# Patient Record
Sex: Male | Born: 1945 | Race: Black or African American | Hispanic: No | Smoking: Current every day smoker
Health system: Southern US, Community
[De-identification: ages and names within clinical notes are randomized; demographics above are authoritative.]

## PROBLEM LIST (undated history)

## (undated) DIAGNOSIS — G8191 Hemiplegia, unspecified affecting right dominant side: Secondary | ICD-10-CM

## (undated) DIAGNOSIS — I10 Essential (primary) hypertension: Secondary | ICD-10-CM

## (undated) DIAGNOSIS — I619 Nontraumatic intracerebral hemorrhage, unspecified: Secondary | ICD-10-CM

## (undated) DIAGNOSIS — I639 Cerebral infarction, unspecified: Secondary | ICD-10-CM

## (undated) HISTORY — PX: NO PAST SURGERIES: SHX2092

## (undated) HISTORY — DX: Hemiplegia, unspecified affecting right dominant side: G81.91

## (undated) HISTORY — DX: Essential (primary) hypertension: I10

## (undated) HISTORY — DX: Nontraumatic intracerebral hemorrhage, unspecified: I61.9

---

## 2005-05-09 ENCOUNTER — Emergency Department: Payer: Self-pay | Admitting: General Practice

## 2005-05-12 ENCOUNTER — Emergency Department: Payer: Self-pay | Admitting: Emergency Medicine

## 2011-01-25 ENCOUNTER — Emergency Department: Payer: Self-pay | Admitting: Unknown Physician Specialty

## 2014-07-04 ENCOUNTER — Encounter (HOSPITAL_COMMUNITY): Payer: Self-pay

## 2014-07-04 ENCOUNTER — Emergency Department (HOSPITAL_COMMUNITY): Payer: Medicare Other

## 2014-07-04 ENCOUNTER — Inpatient Hospital Stay (HOSPITAL_COMMUNITY)
Admission: EM | Admit: 2014-07-04 | Discharge: 2014-07-07 | DRG: 065 | Disposition: A | Discharge status: Rehabilitation Facility | Payer: Medicare Other | Source: Other Acute Inpatient Hospital | Attending: Neurology | Admitting: Neurology

## 2014-07-04 ENCOUNTER — Other Ambulatory Visit (HOSPITAL_COMMUNITY): Payer: Self-pay

## 2014-07-04 DIAGNOSIS — G9341 Metabolic encephalopathy: Secondary | ICD-10-CM | POA: Diagnosis present

## 2014-07-04 DIAGNOSIS — I6931 Cognitive deficits following cerebral infarction: Secondary | ICD-10-CM | POA: Diagnosis not present

## 2014-07-04 DIAGNOSIS — R8290 Unspecified abnormal findings in urine: Secondary | ICD-10-CM | POA: Diagnosis present

## 2014-07-04 DIAGNOSIS — K5900 Constipation, unspecified: Secondary | ICD-10-CM | POA: Diagnosis present

## 2014-07-04 DIAGNOSIS — R4701 Aphasia: Secondary | ICD-10-CM | POA: Diagnosis present

## 2014-07-04 DIAGNOSIS — I629 Nontraumatic intracranial hemorrhage, unspecified: Secondary | ICD-10-CM

## 2014-07-04 DIAGNOSIS — I615 Nontraumatic intracerebral hemorrhage, intraventricular: Secondary | ICD-10-CM | POA: Diagnosis present

## 2014-07-04 DIAGNOSIS — E860 Dehydration: Secondary | ICD-10-CM | POA: Diagnosis present

## 2014-07-04 DIAGNOSIS — J9601 Acute respiratory failure with hypoxia: Secondary | ICD-10-CM | POA: Diagnosis present

## 2014-07-04 DIAGNOSIS — G8191 Hemiplegia, unspecified affecting right dominant side: Secondary | ICD-10-CM | POA: Diagnosis present

## 2014-07-04 DIAGNOSIS — G919 Hydrocephalus, unspecified: Secondary | ICD-10-CM | POA: Diagnosis present

## 2014-07-04 DIAGNOSIS — I69151 Hemiplegia and hemiparesis following nontraumatic intracerebral hemorrhage affecting right dominant side: Secondary | ICD-10-CM | POA: Diagnosis not present

## 2014-07-04 DIAGNOSIS — I10 Essential (primary) hypertension: Secondary | ICD-10-CM | POA: Diagnosis present

## 2014-07-04 DIAGNOSIS — I619 Nontraumatic intracerebral hemorrhage, unspecified: Secondary | ICD-10-CM | POA: Diagnosis present

## 2014-07-04 DIAGNOSIS — R4189 Other symptoms and signs involving cognitive functions and awareness: Secondary | ICD-10-CM | POA: Diagnosis present

## 2014-07-04 DIAGNOSIS — R4182 Altered mental status, unspecified: Secondary | ICD-10-CM

## 2014-07-04 DIAGNOSIS — H538 Other visual disturbances: Secondary | ICD-10-CM | POA: Diagnosis present

## 2014-07-04 DIAGNOSIS — F17210 Nicotine dependence, cigarettes, uncomplicated: Secondary | ICD-10-CM | POA: Diagnosis present

## 2014-07-04 DIAGNOSIS — R0902 Hypoxemia: Secondary | ICD-10-CM

## 2014-07-04 DIAGNOSIS — G8100 Flaccid hemiplegia affecting unspecified side: Secondary | ICD-10-CM | POA: Diagnosis not present

## 2014-07-04 DIAGNOSIS — I77810 Thoracic aortic ectasia: Secondary | ICD-10-CM | POA: Diagnosis present

## 2014-07-04 DIAGNOSIS — E873 Alkalosis: Secondary | ICD-10-CM | POA: Diagnosis present

## 2014-07-04 DIAGNOSIS — I69319 Unspecified symptoms and signs involving cognitive functions following cerebral infarction: Secondary | ICD-10-CM

## 2014-07-04 DIAGNOSIS — E785 Hyperlipidemia, unspecified: Secondary | ICD-10-CM | POA: Diagnosis present

## 2014-07-04 DIAGNOSIS — E119 Type 2 diabetes mellitus without complications: Secondary | ICD-10-CM | POA: Diagnosis present

## 2014-07-04 DIAGNOSIS — R29810 Facial weakness: Secondary | ICD-10-CM | POA: Diagnosis present

## 2014-07-04 DIAGNOSIS — I610 Nontraumatic intracerebral hemorrhage in hemisphere, subcortical: Secondary | ICD-10-CM | POA: Diagnosis present

## 2014-07-04 DIAGNOSIS — R471 Dysarthria and anarthria: Secondary | ICD-10-CM | POA: Diagnosis present

## 2014-07-04 DIAGNOSIS — R1310 Dysphagia, unspecified: Secondary | ICD-10-CM | POA: Diagnosis present

## 2014-07-04 DIAGNOSIS — I69898 Other sequelae of other cerebrovascular disease: Secondary | ICD-10-CM | POA: Diagnosis not present

## 2014-07-04 DIAGNOSIS — I6981 Cognitive deficits following other cerebrovascular disease: Secondary | ICD-10-CM | POA: Diagnosis not present

## 2014-07-04 DIAGNOSIS — E876 Hypokalemia: Secondary | ICD-10-CM | POA: Diagnosis present

## 2014-07-04 DIAGNOSIS — I639 Cerebral infarction, unspecified: Secondary | ICD-10-CM

## 2014-07-04 DIAGNOSIS — I6789 Other cerebrovascular disease: Secondary | ICD-10-CM | POA: Diagnosis not present

## 2014-07-04 LAB — PROTIME-INR
INR: 1.24 (ref 0.00–1.49)
PROTHROMBIN TIME: 15.7 s — AB (ref 11.6–15.2)

## 2014-07-04 LAB — URINALYSIS, ROUTINE W REFLEX MICROSCOPIC
Glucose, UA: 100 mg/dL — AB
KETONES UR: 15 mg/dL — AB
NITRITE: NEGATIVE
Protein, ur: 100 mg/dL — AB
Specific Gravity, Urine: 1.028 (ref 1.005–1.030)
UROBILINOGEN UA: 1 mg/dL (ref 0.0–1.0)
pH: 5.5 (ref 5.0–8.0)

## 2014-07-04 LAB — COMPREHENSIVE METABOLIC PANEL
ALT: 15 U/L — AB (ref 17–63)
AST: 45 U/L — AB (ref 15–41)
Albumin: 4.2 g/dL (ref 3.5–5.0)
Alkaline Phosphatase: 60 U/L (ref 38–126)
Anion gap: 15 (ref 5–15)
BUN: 13 mg/dL (ref 6–20)
CALCIUM: 9.9 mg/dL (ref 8.9–10.3)
CHLORIDE: 100 mmol/L — AB (ref 101–111)
CO2: 22 mmol/L (ref 22–32)
Creatinine, Ser: 1.39 mg/dL — ABNORMAL HIGH (ref 0.61–1.24)
GFR calc non Af Amer: 50 mL/min — ABNORMAL LOW (ref >60)
GFR, EST AFRICAN AMERICAN: 58 mL/min — AB (ref >60)
Glucose, Bld: 158 mg/dL — ABNORMAL HIGH (ref 65–99)
POTASSIUM: 3.4 mmol/L — AB (ref 3.5–5.1)
Sodium: 137 mmol/L (ref 135–145)
Total Bilirubin: 0.9 mg/dL (ref 0.3–1.2)
Total Protein: 8 g/dL (ref 6.5–8.1)

## 2014-07-04 LAB — CBC
HCT: 40 % (ref 39.0–52.0)
Hemoglobin: 13.6 g/dL (ref 13.0–17.0)
MCH: 29.7 pg (ref 26.0–34.0)
MCHC: 34 g/dL (ref 30.0–36.0)
MCV: 87.3 fL (ref 78.0–100.0)
Platelets: 156 10*3/uL (ref 150–400)
RBC: 4.58 MIL/uL (ref 4.22–5.81)
RDW: 14 % (ref 11.5–15.5)
WBC: 11 10*3/uL — AB (ref 4.0–10.5)

## 2014-07-04 LAB — RAPID URINE DRUG SCREEN, HOSP PERFORMED
Amphetamines: NOT DETECTED
BARBITURATES: NOT DETECTED
Benzodiazepines: NOT DETECTED
Cocaine: NOT DETECTED
Opiates: NOT DETECTED
TETRAHYDROCANNABINOL: NOT DETECTED

## 2014-07-04 LAB — URINE MICROSCOPIC-ADD ON

## 2014-07-04 LAB — GLUCOSE, CAPILLARY
Glucose-Capillary: 134 mg/dL — ABNORMAL HIGH (ref 65–99)
Glucose-Capillary: 129 mg/dL — ABNORMAL HIGH (ref 65–99)

## 2014-07-04 LAB — DIFFERENTIAL
Basophils Absolute: 0 10*3/uL (ref 0.0–0.1)
Basophils Relative: 0 % (ref 0–1)
EOS ABS: 0 10*3/uL (ref 0.0–0.7)
EOS PCT: 0 % (ref 0–5)
LYMPHS PCT: 9 % — AB (ref 12–46)
Lymphs Abs: 1 10*3/uL (ref 0.7–4.0)
MONO ABS: 0.7 10*3/uL (ref 0.1–1.0)
MONOS PCT: 6 % (ref 3–12)
NEUTROS ABS: 9.3 10*3/uL — AB (ref 1.7–7.7)
Neutrophils Relative %: 85 % — ABNORMAL HIGH (ref 43–77)

## 2014-07-04 LAB — ETHANOL: Alcohol, Ethyl (B): <5 mg/dL (ref <5)

## 2014-07-04 LAB — I-STAT CHEM 8, ED
BUN: 17 mg/dL (ref 6–20)
CHLORIDE: 100 mmol/L — AB (ref 101–111)
Calcium, Ion: 1.14 mmol/L (ref 1.13–1.30)
Creatinine, Ser: 1.3 mg/dL — ABNORMAL HIGH (ref 0.61–1.24)
Glucose, Bld: 161 mg/dL — ABNORMAL HIGH (ref 65–99)
HEMATOCRIT: 45 % (ref 39.0–52.0)
Hemoglobin: 15.3 g/dL (ref 13.0–17.0)
POTASSIUM: 3.4 mmol/L — AB (ref 3.5–5.1)
Sodium: 137 mmol/L (ref 135–145)
TCO2: 20 mmol/L (ref 0–100)

## 2014-07-04 LAB — I-STAT TROPONIN, ED: Troponin i, poc: 0.05 ng/mL (ref 0.00–0.08)

## 2014-07-04 LAB — APTT: aPTT: 26 seconds (ref 24–37)

## 2014-07-04 LAB — MRSA PCR SCREENING: MRSA by PCR: NEGATIVE

## 2014-07-04 MED ORDER — ACETAMINOPHEN 650 MG RE SUPP: 650.0000 mg | RECTAL | Status: DC | PRN

## 2014-07-04 MED ORDER — STROKE: EARLY STAGES OF RECOVERY BOOK
Freq: Once | Status: AC
  Administered 2014-07-04: 20:00:00
  Filled 2014-07-04: qty 1

## 2014-07-04 MED ORDER — PANTOPRAZOLE SODIUM 40 MG IV SOLR
40.0000 mg | Freq: Every day | INTRAVENOUS | Status: DC
  Administered 2014-07-04: 40 mg via INTRAVENOUS
  Filled 2014-07-04 (×2): qty 40

## 2014-07-04 MED ORDER — ACETAMINOPHEN 325 MG PO TABS: 650.0000 mg | ORAL_TABLET | ORAL | Status: DC | PRN

## 2014-07-04 MED ORDER — LABETALOL HCL 5 MG/ML IV SOLN
10.0000 mg | INTRAVENOUS | Status: DC | PRN
  Administered 2014-07-04: 10 mg via INTRAVENOUS
  Administered 2014-07-05: 20 mg via INTRAVENOUS
  Filled 2014-07-04 (×2): qty 4

## 2014-07-04 MED ORDER — SENNOSIDES-DOCUSATE SODIUM 8.6-50 MG PO TABS
1.0000 | ORAL_TABLET | Freq: Two times a day (BID) | ORAL | Status: DC
  Administered 2014-07-05 – 2014-07-06 (×3): 1 via ORAL
  Filled 2014-07-04 (×7): qty 1

## 2014-07-04 NOTE — Consult Note (Signed)
Stroke Consult    Chief Complaint: right sided weakness and confusion  HPI: Niklaus Mamaril is an 69 y.o. male with history of hypertension presenting after being found down with confusion and right sided weakness. Unclear last seen well. Per EMS, his neighbor reports seeing the patient around "lunch" and states he was in normal state of health then. Later in the afternoon he was found down and noted to have confusion and not moving his right side.   CT head imaging reviewed shows left ICH with intraventricular extension.   Date last known well: 07/04/2014 Time last known well: unclear tPA Given: no, ICH  No past medical history on file.  No past surgical history on file.  No family history on file. Social History:  has no tobacco, alcohol, and drug history on file.  Allergies: Allergies not on file   (Not in a hospital admission)  ROS: Out of a complete 14 system review, the patient complains of only the following symptoms, and all other reviewed systems are negative. Unable to obtain  Physical Examination: There were no vitals filed for this visit. Physical Exam  Constitutional: He appears well-developed and well-nourished.  Psych: Affect appropriate to situation Eyes: No scleral injection HENT: No OP obstrucion Head: Normocephalic.  Cardiovascular: Normal rate and regular rhythm.  Respiratory: Effort normal and breath sounds normal.  GI: Soft. Bowel sounds are normal. No distension. There is no tenderness.  Skin: WDI   Neurologic Examination: Mental Status: Alert, oriented, to name, "hospital", unsure on date. Mild to moderate expressive and receptive aphasia. Mild dysarthria.  Cranial Nerves: II: optic discs not visualized, blinks to threat bilaterally, pupils equal, round, reactive to light  III,IV, VI: ptosis not present, extra-ocular motions intact bilaterally V,VII: flattening of right NLF with right facial weakness, facial light touch sensation normal  bilaterally VIII: hearing normal bilaterally IX,X: gag reflex present XI: trapezius strength/neck flexion strength normal bilaterally XII: tongue strength normal  Motor: Difficulty formally testing due to language deficit but briskly lifts LUE and LLE against gravity and light resistance RUE falls to bed when lifted up Proximal RLE 2/5, distal 4/5 Sensory: decreased withdrawal on right side, reports intact sensation though to LT, bilaterally, appears to be neglecting right side though difficult to fully assess due to language Deep Tendon Reflexes: 2+ and symmetric throughout Plantars: Right: downgoing   Left: downgoing Cerebellar: Unable to assess on right side, intact on left Gait: deferred  Laboratory Studies:   Basic Metabolic Panel: No results for input(s): NA, K, CL, CO2, GLUCOSE, BUN, CREATININE, CALCIUM, MG, PHOS in the last 168 hours.  Liver Function Tests: No results for input(s): AST, ALT, ALKPHOS, BILITOT, PROT, ALBUMIN in the last 168 hours. No results for input(s): LIPASE, AMYLASE in the last 168 hours. No results for input(s): AMMONIA in the last 168 hours.  CBC: No results for input(s): WBC, NEUTROABS, HGB, HCT, MCV, PLT in the last 168 hours.  Cardiac Enzymes: No results for input(s): CKTOTAL, CKMB, CKMBINDEX, TROPONINI in the last 168 hours.  BNP: Invalid input(s): POCBNP  CBG: No results for input(s): GLUCAP in the last 168 hours.  Microbiology: No results found for this or any previous visit.  Coagulation Studies: No results for input(s): LABPROT, INR in the last 72 hours.  Urinalysis: No results for input(s): COLORURINE, LABSPEC, PHURINE, GLUCOSEU, HGBUR, BILIRUBINUR, KETONESUR, PROTEINUR, UROBILINOGEN, NITRITE, LEUKOCYTESUR in the last 168 hours.  Invalid input(s): APPERANCEUR  Lipid Panel:  No results found for: CHOL, TRIG, HDL, CHOLHDL, VLDL, LDLCALC  HgbA1C: No results found for: HGBA1C  Urine Drug Screen:  No results found for: LABOPIA,  COCAINSCRNUR, LABBENZ, AMPHETMU, THCU, LABBARB  Alcohol Level: No results for input(s): ETH in the last 168 hours.  Other results:  Imaging: No results found.  Assessment: 69 y.o. male with hx of HTN presenting after being found down with confusion and right sided weakness. CT head imaging reviewed and shows a left BG ICH with intraventricular extension and minimal left to right midline shift.   Plan: 1) Admit to ICU 2)repeat head CT in the morning 3) no antiplatelets or anticoagulants 4) blood pressure control with goal systolic <160 5) Frequent neuro checks 6) If symptoms worsen or there is decreased mental status, repeat stat head CT 7) PT,OT,ST     Elspeth ChoPeter Stellarose Cerny, DO [facility] [phone]  If 7pm- 7am, please page neurology on call as listed in AMION. 07/04/2014, 6:35 PM

## 2014-07-04 NOTE — ED Notes (Signed)
Unsuccessful in and out cath attempt, reported to receiving RN, Quitman LivingsEllie

## 2014-07-04 NOTE — ED Notes (Signed)
[facility]- Pt LSN around 12pm. Noted to have right side facial droop and weakness. Alert to person and place. Pt confused on arrival. Airway intact.

## 2014-07-04 NOTE — ED Notes (Signed)
Patient attempted to void, unsuccessful.  

## 2014-07-04 NOTE — ED Notes (Signed)
CBG 134 

## 2014-07-04 NOTE — H&P (Signed)
Chief Complaint: right sided weakness and confusion  HPI: Michael Goodwin is an 69 y.o. male with history of hypertension presenting after being found down with confusion and right sided weakness. Unclear last seen well. Per EMS, his neighbor reports seeing the patient around "lunch" and states he was in normal state of health then. Later in the afternoon he was found down and noted to have confusion and not moving his right side.   CT head imaging reviewed shows left ICH with intraventricular extension.   Date last known well: 07/04/2014 Time last known well: unclear tPA Given: no, ICH  No past medical history on file.  No past surgical history on file.  No family history on file. Social History:  has no tobacco, alcohol, and drug history on file.  Allergies: Allergies not on file   (Not in a hospital admission)  ROS: Out of a complete 14 system review, the patient complains of only the following symptoms, and all other reviewed systems are negative. Unable to obtain  Physical Examination: There were no vitals filed for this visit. Physical Exam  Constitutional: He appears well-developed and well-nourished.  Psych: Affect appropriate to situation Eyes: No scleral injection HENT: No OP obstrucion Head: Normocephalic.  Cardiovascular: Normal rate and regular rhythm.  Respiratory: Effort normal and breath sounds normal.  GI: Soft. Bowel sounds are normal. No distension. There is no tenderness.  Skin: WDI   Neurologic Examination: Mental Status: Alert, oriented, to name, "hospital", unsure on date. Mild to moderate expressive and receptive aphasia. Mild dysarthria.  Cranial Nerves: II: optic discs not visualized, blinks to threat bilaterally, pupils equal, round, reactive to light  III,IV, VI: ptosis not present, extra-ocular motions intact bilaterally V,VII: flattening of right NLF with right facial weakness, facial light touch sensation normal bilaterally VIII:  hearing normal bilaterally IX,X: gag reflex present XI: trapezius strength/neck flexion strength normal bilaterally XII: tongue strength normal  Motor: Difficulty formally testing due to language deficit but briskly lifts LUE and LLE against gravity and light resistance RUE falls to bed when lifted up Proximal RLE 2/5, distal 4/5 Sensory: decreased withdrawal on right side, reports intact sensation though to LT, bilaterally, appears to be neglecting right side though difficult to fully assess due to language Deep Tendon Reflexes: 2+ and symmetric throughout Plantars: Right: downgoingLeft: downgoing Cerebellar: Unable to assess on right side, intact on left Gait: deferred  Laboratory Studies:  Basic Metabolic Panel:  Last Labs     No results for input(s): NA, K, CL, CO2, GLUCOSE, BUN, CREATININE, CALCIUM, MG, PHOS in the last 168 hours.    Liver Function Tests:  Last Labs     No results for input(s): AST, ALT, ALKPHOS, BILITOT, PROT, ALBUMIN in the last 168 hours.    Last Labs     No results for input(s): LIPASE, AMYLASE in the last 168 hours.    Last Labs     No results for input(s): AMMONIA in the last 168 hours.    CBC:  Last Labs     No results for input(s): WBC, NEUTROABS, HGB, HCT, MCV, PLT in the last 168 hours.    Cardiac Enzymes:  Last Labs     No results for input(s): CKTOTAL, CKMB, CKMBINDEX, TROPONINI in the last 168 hours.    BNP:  Last Labs     Invalid input(s): POCBNP    CBG:  Last Labs     No results for input(s): GLUCAP in the last 168 hours.    Microbiology: No  results found for this or any previous visit.  Coagulation Studies:  Recent Labs (last 2 labs)     No results for input(s): LABPROT, INR in the last 72 hours.     Last Labs     Urinalysis: No results for input(s): COLORURINE, LABSPEC, PHURINE, GLUCOSEU, HGBUR, BILIRUBINUR, KETONESUR, PROTEINUR, UROBILINOGEN, NITRITE, LEUKOCYTESUR in the last 168  hours.  Invalid input(s): APPERANCEUR    Lipid Panel:   Labs (Brief)    No results found for: CHOL, TRIG, HDL, CHOLHDL, VLDL, LDLCALC    HgbA1C:    Recent Labs    No results found for: HGBA1C    Urine Drug Screen:    Labs (Brief)    No results found for: LABOPIA, COCAINSCRNUR, LABBENZ, AMPHETMU, THCU, LABBARB    Alcohol Level:    Last Labs     No results for input(s): ETH in the last 168 hours.    Other results:  Imaging:  Imaging Results (Last 48 hours)    No results found.    Assessment: 69 y.o. male with hx of HTN presenting after being found down with confusion and right sided weakness. CT head imaging reviewed and shows a left BG ICH with intraventricular extension and minimal left to right midline shift.   Plan: 1) Admit to ICU 2)repeat head CT in the morning 3) no antiplatelets or anticoagulants 4) blood pressure control with goal systolic <160 5) Frequent neuro checks 6) If symptoms worsen or there is decreased mental status, repeat stat head CT 7) PT,OT,ST     Michael ChoPeter Jetson Pickrel, DO [facility] [phone]  If 7pm- 7am, please page neurology on call as listed in AMION. 07/04/2014, 6:35 PM

## 2014-07-04 NOTE — Progress Notes (Signed)
eLink Physician-Brief Progress Note Patient Name: Michael ArbourClyde Goodwin DOB: December 19, 1945 MRN: [id number]   Date of Service  07/04/2014  HPI/Events of Note  69 yo male with Left ICH with intraventricular extension  eICU Interventions  - monitor BP - no antiplateletes or anticoagulants - recs per neuro - repeat CT in the AM.      Intervention Category Evaluation Type: New Patient Evaluation  Christinia Lambeth 07/04/2014, 9:12 PM

## 2014-07-04 NOTE — ED Provider Notes (Addendum)
CSN: [id number]     Arrival date & time 07/04/14  1827 History   None    No chief complaint on file.    (Consider location/radiation/quality/duration/timing/severity/associated sxs/prior Treatment) HPI Comments: EMS brought patient as a code stroke last seen normal by neighbors around noon. Patient was found by EMS on the floor with right-sided weakness.  Her neighbors patient normally takes no medications regularly has no medical problems and functions independently without deficits.  The history is provided by the EMS personnel. The history is limited by the condition of the patient and the absence of a caregiver.    History reviewed. No pertinent past medical history. No past surgical history on file. No family history on file. History  Substance Use Topics  . Smoking status: Not on file  . Smokeless tobacco: Not on file  . Alcohol Use: Not on file    Review of Systems  Unable to perform ROS     Allergies  Review of patient's allergies indicates not on file.  Home Medications   Prior to Admission medications   Not on File   There were no vitals taken for this visit. Physical Exam  Constitutional: He is oriented to person, place, and time. He appears well-developed and well-nourished. No distress.  HENT:  Head: Normocephalic and atraumatic.    Mouth/Throat: Oropharynx is clear and moist.  Eyes: Conjunctivae and EOM are normal. Pupils are equal, round, and reactive to light.  Neck: Normal range of motion. Neck supple.  Cardiovascular: Normal rate, regular rhythm and intact distal pulses.   No murmur heard. Pulmonary/Chest: Effort normal and breath sounds normal. No respiratory distress. He has no wheezes. He has no rales.  Abdominal: Soft. He exhibits no distension. There is no tenderness. There is no rebound and no guarding.  Musculoskeletal: Normal range of motion. He exhibits no edema or tenderness.  Neurological: He is alert and oriented to person, place, and  time. He has normal strength. A cranial nerve deficit and sensory deficit is present.  Right-sided facial droop, visual field cut in the right eye, 4 out of 5 strength in the right upper and lower extremity.  Right sided neglect  Skin: Skin is warm and dry. No rash noted. No erythema.  Psychiatric: He has a normal mood and affect. His behavior is normal.  Nursing note and vitals reviewed.   ED Course  Procedures (including critical care time) Labs Review Labs Reviewed  PROTIME-INR - Abnormal; Notable for the following:    Prothrombin Time 15.7 (*)    All other components within normal limits  CBC - Abnormal; Notable for the following:    WBC 11.0 (*)    All other components within normal limits  DIFFERENTIAL - Abnormal; Notable for the following:    Neutrophils Relative % 85 (*)    Neutro Abs 9.3 (*)    Lymphocytes Relative 9 (*)    All other components within normal limits  I-STAT CHEM 8, ED - Abnormal; Notable for the following:    Potassium 3.4 (*)    Chloride 100 (*)    Creatinine, Ser 1.30 (*)    Glucose, Bld 161 (*)    All other components within normal limits  APTT  ETHANOL  COMPREHENSIVE METABOLIC PANEL  URINE RAPID DRUG SCREEN (HOSP PERFORMED)  URINALYSIS, ROUTINE W REFLEX MICROSCOPIC  I-STAT TROPOININ, ED    Imaging Review No results found.   EKG Interpretation None      MDM   Final diagnoses:  Intracranial hemorrhage  Patient presents as a code stroke after EMS found him on the floor of his home unable to walk with right-sided weakness. Neighbors called EMS and stated they last saw him normal around lunchtime. Her neighbors patient takes no medications and has no medical problems. On exam patient is awake with obvious right-sided deficits. He does admit to drinking alcohol but denies daily alcohol use.  Neurology team present upon patient's arrival. He was made a code stroke with order set initiated. CT of the head shows an intracranial bleed which is  most likely hypertensive in nature. Less likely to be related to trauma.  Given intracranial bleed patient is not a candidate for TPA.  Will be admitted to neurology   Gwyneth Sprout, MD 07/04/14 1905  Gwyneth Sprout, MD 07/04/14 [id number]

## 2014-07-05 ENCOUNTER — Encounter (HOSPITAL_COMMUNITY): Payer: Self-pay

## 2014-07-05 ENCOUNTER — Ambulatory Visit (HOSPITAL_COMMUNITY): Payer: Medicare Other

## 2014-07-05 ENCOUNTER — Inpatient Hospital Stay (HOSPITAL_COMMUNITY): Payer: Medicare Other

## 2014-07-05 DIAGNOSIS — I610 Nontraumatic intracerebral hemorrhage in hemisphere, subcortical: Principal | ICD-10-CM

## 2014-07-05 DIAGNOSIS — I6789 Other cerebrovascular disease: Secondary | ICD-10-CM

## 2014-07-05 LAB — LIPID PANEL
Cholesterol: 162 mg/dL (ref 0–200)
HDL: 95 mg/dL (ref >40)
LDL Cholesterol: 55 mg/dL (ref 0–99)
Total CHOL/HDL Ratio: 1.7 RATIO
Triglycerides: 62 mg/dL (ref <150)
VLDL: 12 mg/dL (ref 0–40)

## 2014-07-05 LAB — CBC
HCT: 40.1 % (ref 39.0–52.0)
HEMOGLOBIN: 13.6 g/dL (ref 13.0–17.0)
MCH: 29.8 pg (ref 26.0–34.0)
MCHC: 33.9 g/dL (ref 30.0–36.0)
MCV: 87.7 fL (ref 78.0–100.0)
PLATELETS: 172 10*3/uL (ref 150–400)
RBC: 4.57 MIL/uL (ref 4.22–5.81)
RDW: 14.1 % (ref 11.5–15.5)
WBC: 9.8 10*3/uL (ref 4.0–10.5)

## 2014-07-05 LAB — GLUCOSE, CAPILLARY
Glucose-Capillary: 120 mg/dL — ABNORMAL HIGH (ref 65–99)
Glucose-Capillary: 131 mg/dL — ABNORMAL HIGH (ref 65–99)

## 2014-07-05 LAB — BASIC METABOLIC PANEL
Anion gap: 11 (ref 5–15)
BUN: 19 mg/dL (ref 6–20)
CALCIUM: 10 mg/dL (ref 8.9–10.3)
CHLORIDE: 102 mmol/L (ref 101–111)
CO2: 26 mmol/L (ref 22–32)
CREATININE: 1.29 mg/dL — AB (ref 0.61–1.24)
GFR calc Af Amer: >60 mL/min (ref >60)
GFR, EST NON AFRICAN AMERICAN: 55 mL/min — AB (ref >60)
Glucose, Bld: 105 mg/dL — ABNORMAL HIGH (ref 65–99)
POTASSIUM: 3.7 mmol/L (ref 3.5–5.1)
Sodium: 139 mmol/L (ref 135–145)

## 2014-07-05 LAB — TSH: TSH: 1.554 u[IU]/mL (ref 0.350–4.500)

## 2014-07-05 MED ORDER — CETYLPYRIDINIUM CHLORIDE 0.05 % MT LIQD
7.0000 mL | Freq: Two times a day (BID) | OROMUCOSAL | Status: DC
  Administered 2014-07-05 – 2014-07-07 (×4): 7 mL via OROMUCOSAL

## 2014-07-05 MED ORDER — PANTOPRAZOLE SODIUM 40 MG PO TBEC
40.0000 mg | DELAYED_RELEASE_TABLET | Freq: Every day | ORAL | Status: DC
  Administered 2014-07-05 – 2014-07-07 (×3): 40 mg via ORAL
  Filled 2014-07-05 (×3): qty 1

## 2014-07-05 MED ORDER — LISINOPRIL 20 MG PO TABS
20.0000 mg | ORAL_TABLET | Freq: Every day | ORAL | Status: DC
  Administered 2014-07-05 – 2014-07-06 (×2): 20 mg via ORAL
  Filled 2014-07-05 (×2): qty 1

## 2014-07-05 MED ORDER — RESOURCE THICKENUP CLEAR PO POWD
ORAL | Status: DC | PRN
  Filled 2014-07-05: qty 125

## 2014-07-05 MED ORDER — CHLORHEXIDINE GLUCONATE 0.12 % MT SOLN
15.0000 mL | Freq: Two times a day (BID) | OROMUCOSAL | Status: DC
  Administered 2014-07-05 – 2014-07-06 (×4): 15 mL via OROMUCOSAL
  Filled 2014-07-05 (×4): qty 15

## 2014-07-05 NOTE — Progress Notes (Signed)
STROKE TEAM PROGRESS NOTE   SUBJECTIVE (INTERVAL HISTORY) No family is at the bedside.  Overall he feels his condition is stable. His BP around 150s and was received one dose of labetalol. He is waiting for speech to clear for po access.    OBJECTIVE Temp:  [98 F (36.7 C)-98.9 F (37.2 C)] 98.5 F (36.9 C) (05/25 0739) Pulse Rate:  [66-126] 98 (05/25 0600) Cardiac Rhythm:  [-] Sinus tachycardia (05/24 2010) Resp:  [14-24] 23 (05/25 0600) BP: (131-173)/(83-103) 153/95 mmHg (05/25 0600) SpO2:  [97 %-100 %] 100 % (05/25 0600) Weight:  [142 lb 12.8 oz (64.774 kg)] 142 lb 12.8 oz (64.774 kg) (05/24 1904)   Recent Labs Lab 07/04/14 1917 07/04/14 2014 07/05/14 0002 07/05/14 0351  GLUCAP 134* 129* 131* 120*    Recent Labs Lab 07/04/14 1831 07/04/14 1835  NA 137 137  K 3.4* 3.4*  CL 100* 100*  CO2 22  --   GLUCOSE 158* 161*  BUN 13 17  CREATININE 1.39* 1.30*  CALCIUM 9.9  --     Recent Labs Lab 07/04/14 1831  AST 45*  ALT 15*  ALKPHOS 60  BILITOT 0.9  PROT 8.0  ALBUMIN 4.2    Recent Labs Lab 07/04/14 1831 07/04/14 1835  WBC 11.0*  --   NEUTROABS 9.3*  --   HGB 13.6 15.3  HCT 40.0 45.0  MCV 87.3  --   PLT 156  --    No results for input(s): CKTOTAL, CKMB, CKMBINDEX, TROPONINI in the last 168 hours.  Recent Labs  07/04/14 1831  LABPROT 15.7*  INR 1.24    Recent Labs  07/04/14 2057  COLORURINE ORANGE*  LABSPEC 1.028  PHURINE 5.5  GLUCOSEU 100*  HGBUR LARGE*  BILIRUBINUR SMALL*  KETONESUR 15*  PROTEINUR 100*  UROBILINOGEN 1.0  NITRITE NEGATIVE  LEUKOCYTESUR SMALL*    No results found for: CHOL, TRIG, HDL, CHOLHDL, VLDL, LDLCALC No results found for: HGBA1C    Component Value Date/Time   LABOPIA NONE DETECTED 07/04/2014 2057   COCAINSCRNUR NONE DETECTED 07/04/2014 2057   LABBENZ NONE DETECTED 07/04/2014 2057   AMPHETMU NONE DETECTED 07/04/2014 2057   THCU NONE DETECTED 07/04/2014 2057   LABBARB NONE DETECTED 07/04/2014 2057      Recent Labs Lab 07/04/14 1831  ETH <5    I have personally reviewed the radiological images below and agree with the radiology interpretations.  Ct Head Wo Contrast  07/05/2014   IMPRESSION: Evolving LEFT thalamic 3.3 x 2.3 cm hematoma with intraventricular extension. 4 mm LEFT-to-RIGHT midline shift. Mild hydrocephalus suspected.  Moderate white matter changes suggest chronic small vessel ischemic disease.   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   Carotid Doppler  pending  2D Echocardiogram  pending  EKG  normal sinus rhythm. For complete results please see formal report.  PHYSICAL EXAM  Temp:  [98 F (36.7 C)-98.9 F (37.2 C)] 98.5 F (36.9 C) (05/25 0739) Pulse Rate:  [66-126] 98 (05/25 0600) Resp:  [14-24] 23 (05/25 0600) BP: (131-173)/(83-103) 153/95 mmHg (05/25 0600) SpO2:  [97 %-100 %] 100 % (05/25 0600) Weight:  [142 lb 12.8 oz (64.774 kg)] 142 lb 12.8 oz (64.774 kg) (05/24 1904)  General - Well nourished, well developed, in no apparent distress.  Ophthalmologic - fundi not visualized due to noncooperation.  Cardiovascular - Regular rate and rhythm.  Mental Status -  Awake alert but only orientated to age and name, but not orientated to time, place, or person. Severe dysarthria, with perseveration, able to repeat simple sentences, name 1/3 with transcortical motor aphasia.   Cranial Nerves II - XII - II - not blinking to visual threat to the right. III, IV, VI - left eye gaze preference but able to cross midline, mild disconjugation of eyes. V - Facial sensation intact bilaterally. VII - right facial droop. VIII - Hearing & vestibular intact bilaterally. X - severe dysarthria. XI - Chin turning & shoulder shrug intact bilaterally. XII - Tongue protrusion not cooperative on  exam.  Motor Strength - The patient's strength was 1/5 RUE and 2-/5 RLE on pain stimulation. LUE and LLE spontaneous movement, good strength. Bulk was normal and fasciculations were absent.   Motor Tone - Muscle tone was assessed at the neck and appendages and was normal.  Reflexes - The patient's reflexes were symmetrical in all extremities and he had bilateral babinski's.  Sensory - Light touch, temperature/pinprick were assessed and were symmetrical.    Coordination - not cooperative on exam for ataxia or dysmetria.  Tremor was absent.  Gait and Station - not tested due to weakness.   ASSESSMENT/PLAN [name] is a 69 y.o. male with history of HTN admitted for left BG hemorrhage with ventricular extension. Symptoms stable.    ICH:  Dominant left BG ICH with ventricular extension likely hypertensive  CT showed left BG ICH with ventricular extension  Repeat CT showed stable hematoma, 15cc in volume  Carotid Doppler  pending  2D Echo  pending  LDL pending  HgbA1c pending  SCDs for VTE prophylaxis  Diet NPO time specified - waiting for speech   no antithrombotic prior to admission, now on no antithrombotic  Ongoing aggressive stroke risk factor management  Therapy recommendations:  pending  Disposition:  pending  Diabetes  HgbA1c pending goal < 7.0  Controlled  CBG monitoring  SSI  Hypertension  Home meds:   Not clear BP goal < 160 Currently on labetalol iv PRN  Stable  Waiting for speech to decide on po meds  Hyperlipidemia  Home meds:  none   Currently no po access  LDL pending, goal < 70  Other Stroke Risk Factors  Advanced age  Other Active Problems    Other Pertinent History    Hospital day # 1  This patient is critically ill due to left BG ICH with ventricular extension and at significant risk of neurological worsening, death form re-bleed, hematoma expansion, hydrocephalus, cerebral edema and brain herniation. This  patient's care requires constant monitoring of vital signs, hemodynamics, respiratory and cardiac monitoring, review of multiple databases, neurological assessment, discussion with family, other specialists and medical decision making of high complexity. I spent 40 minutes of neurocritical care time in the care of this patient.   Marvel Plan, MD PhD Stroke Neurology 07/05/2014 8:54 AM    To contact Stroke Continuity provider, please refer to [email or web address]. After hours, contact General Neurology

## 2014-07-05 NOTE — Evaluation (Signed)
Clinical/Bedside Swallow Evaluation Patient Details  Name: Michael ArbourClyde Goodwin MRN: [id number] Date of Birth: [birth year]  Today's Date: 07/05/2014 Time: SLP Start Time (ACUTE ONLY): 1045 SLP Stop Time (ACUTE ONLY): 1130 SLP Time Calculation (min) (ACUTE ONLY): 45 min  Past Medical History: History reviewed. No pertinent past medical history. Past Surgical History: History reviewed. No pertinent past surgical history. HPI:  69 yo male admitted with left ICH and right sided weakness.   Assessment / Plan / Recommendation Clinical Impression  Pt appears to have a moderate oropharyngeal dysphagia as evidenced by decreased oral strength and  for mastication and manipulation of solid boluses, suspected delayed swallow initiation, and multiple swallows noted with thin liquids.  These deficits result functionally in delayed bolus formation and delayed oral transit with solids, and probable aspiration of thin liquids.  Recommend Dysphagia 3 with chopped meats and Nectar thick liquids. SLP will f/u for diet tolerance and possible diet advancement in 1 day.  If symptoms persists, pt may benefit from objective study for possible compensatory strategies.      Aspiration Risk  Moderate    Diet Recommendation Dysphagia 3 (Mech soft);Nectar (chopped meats)   Medication Administration: Whole meds with puree Compensations: Slow rate;Small sips/bites;Check for pocketing    Other  Recommendations Oral Care Recommendations: Oral care QID;Staff/trained caregiver to provide oral care Other Recommendations: Order thickener from pharmacy;Prohibited food (jello, ice cream, thin soups);Clarify dietary restrictions   Follow Up Recommendations       Frequency and Duration min 3x week  2 weeks   Pertinent Vitals/Pain No complaints        Swallow Study Prior Functional Status       General Other Pertinent Information: 69 yo male admitted with left ICH and right sided weakness. Type of Study: Bedside swallow  evaluation Previous Swallow Assessment: none Diet Prior to this Study: NPO Temperature Spikes Noted: No Respiratory Status: Room air History of Recent Intubation: No Behavior/Cognition: Alert;Cooperative;Pleasant mood Oral Cavity - Dentition: Missing dentition Self-Feeding Abilities: Needs assist Patient Positioning: Upright in bed Baseline Vocal Quality: Low vocal intensity Volitional Cough: Weak Volitional Swallow: Able to elicit    Oral/Motor/Sensory Function Overall Oral Motor/Sensory Function: Impaired Labial ROM: Reduced right Labial Symmetry: Abnormal symmetry right Labial Strength: Reduced Labial Sensation: Reduced Lingual ROM: Reduced right Lingual Symmetry: Abnormal symmetry right Lingual Strength: Reduced Facial Symmetry: Right droop Facial Strength: Reduced Mandible: Within Functional Limits   Ice Chips Ice chips: Within functional limits Presentation: Spoon   Thin Liquid Thin Liquid: Impaired Presentation: Spoon;Cup;Straw Pharyngeal  Phase Impairments: Suspected delayed Swallow;Multiple swallows;Cough - Immediate    Nectar Thick Nectar Thick Liquid: Within functional limits Presentation: Cup   Honey Thick Honey Thick Liquid: Not tested   Puree Puree: Within functional limits Presentation: Spoon   Solid   GO Functional Assessment Tool Used: Clinical judgement  Solid: Impaired Presentation: Self Fed Oral Phase Impairments: Reduced lingual movement/coordination;Impaired anterior to posterior transit       Clemente Dewey T 07/05/2014,11:31 AM

## 2014-07-05 NOTE — Progress Notes (Signed)
VASCULAR LAB PRELIMINARY  PRELIMINARY  PRELIMINARY  PRELIMINARY  Carotid Duplex completed.    Preliminary report: Mild to moderate plaque carotid bifurcations bilaterally.  ICA velocities and ratios within the 1-39% range of stenosis bilaterally.  Vertebral arteries are patent and antegrade bilaterally.  Loralie ChampagneBishop, Maddi Collar F, RVT 07/05/2014, 3:44 PM

## 2014-07-05 NOTE — Progress Notes (Signed)
PT Cancellation Note  Patient Details Name: Michael ArbourClyde Hollis MRN: [id number] DOB: [birth year]   Cancelled Treatment:    Reason Eval/Treat Not Completed: Medical issues which prohibited therapy.  Not ready to come off bedrest yet.   07/05/2014  [place] BingKen Celestine Prim, PT [phone]  (pager)   Dahlton Hinde, Eliseo GumKenneth V 07/05/2014, 11:07 AM

## 2014-07-05 NOTE — Progress Notes (Signed)
Echocardiogram 2D Echocardiogram has been performed.  Dorothey BasemanReel, Ammon Muscatello M 07/05/2014, 2:47 PM

## 2014-07-05 NOTE — Evaluation (Signed)
Speech Language Pathology Evaluation Patient Details Name: Michael Goodwin MRN: [id number] DOB: [birth year] Today's Date: 07/05/2014 Time: 1131-1200 SLP Time Calculation (min) (ACUTE ONLY): 29 min  Problem List:  Patient Active Problem List   Diagnosis Date Noted  . ICH (intracerebral hemorrhage) 07/04/2014  . Intracranial hemorrhage    Past Medical History: History reviewed. No pertinent past medical history. Past Surgical History: History reviewed. No pertinent past surgical history. HPI:  69 yo male admitted with left ICH and right sided weakness.   Assessment / Plan / Recommendation Clinical Impression  Pt presents with a mild dysarthria characterized by decreased volume/intensity and imprecise consonants, which decreases intelligibility in conversation.  No aphasia was noted, but the pt does have frequent dysfluencies, (part-word repititions at the begining of an utterance) that he reports have been present since he was a child.    SLP Assessment  Patient needs continued Speech Lanaguage Pathology Services    Follow Up Recommendations  Inpatient Rehab    Frequency and Duration min 2x/week  1 week   Pertinent Vitals/Pain Pain Assessment: No/denies pain   SLP Goals  Patient/Family Stated Goal: "I want to go home." Potential to Achieve Goals (ACUTE ONLY): Good Potential Considerations (ACUTE ONLY): Family/community support;Financial resources  SLP Evaluation Prior Functioning  Cognitive/Linguistic Baseline: Baseline deficits Baseline deficit details: Dysfuency since a child (part-word repetitions when initiating an utterance. Type of Home: House  Lives With: Alone Available Help at Discharge:  (? Says he has 3 children in the area (not present)) Vocation: Retired   IT consultantCognition  Overall Cognitive Status: Within Functional Limits for tasks assessed Arousal/Alertness: Awake/alert Orientation Level: Oriented to person;Oriented to place Memory: Appears intact Awareness:  Impaired Awareness Impairment: Intellectual impairment Problem Solving: Impaired Problem Solving Impairment: Verbal complex Safety/Judgment:  (TBD)    Comprehension  Auditory Comprehension Overall Auditory Comprehension: Appears within functional limits for tasks assessed Yes/No Questions: Within Functional Limits Commands: Within Functional Limits Conversation: Simple Reading Comprehension Reading Status: Not tested    Expression Expression Primary Mode of Expression: Verbal Verbal Expression Overall Verbal Expression: Appears within functional limits for tasks assessed Initiation: No impairment Level of Generative/Spontaneous Verbalization: Sentence Repetition: No impairment Naming: No impairment Written Expression Dominant Hand: Right Written Expression: Not tested   Oral / Motor Oral Motor/Sensory Function Overall Oral Motor/Sensory Function: Impaired Labial ROM: Reduced right Labial Symmetry: Abnormal symmetry right Labial Strength: Reduced Labial Sensation: Reduced Lingual ROM: Reduced right Lingual Symmetry: Abnormal symmetry right Lingual Strength: Reduced Facial Symmetry: Right droop Facial Strength: Reduced Mandible: Within Functional Limits Motor Speech Overall Motor Speech: Appears within functional limits for tasks assessed Respiration: Within functional limits Phonation: Low vocal intensity Resonance: Within functional limits Articulation: Impaired Level of Impairment: Sentence Intelligibility: Intelligibility reduced Word: 75-100% accurate Phrase: 75-100% accurate Sentence: 75-100% accurate Conversation: 75-100% accurate Motor Planning: Witnin functional limits Motor Speech Errors:  (Baseline dysfluency) Interfering Components: Hearing loss Effective Techniques: Slow rate;Increased vocal intensity;Over-articulate   GO     Maryjo RochesterWillis, Kalanie Fewell T 07/05/2014, 1:15 PM

## 2014-07-05 NOTE — Progress Notes (Signed)
OT Cancellation Note  Patient Details Name: Michael Goodwin MRN: [id number] DOB: [birth year]   Cancelled Treatment:    Reason Eval/Treat Not Completed: Patient not medically ready - pt currently on bedrest.   Angelene GiovanniConarpe, Mossie Gilder M  Tecla Mailloux [place]arpe, OTR/L [phone]  07/05/2014, 10:21 AM

## 2014-07-05 NOTE — Care Management Note (Signed)
Case Management Note  Patient Details  Name: Marguarite ArbourClyde Harvill MRN: [id number] Date of Birth: 10-Sep-1945  Subjective/Objective:     Pt admitted on 07/04/14 with intracerebral hemorrhage.  PTA, pt resided at home alone.             Action/Plan: Will follow for discharge planning as pt progresses.    Expected Discharge Date:                  Expected Discharge Plan:  IP Rehab Facility  In-House Referral:     Discharge planning Services  CM Consult  Post Acute Care Choice:    Choice offered to:     DME Arranged:    DME Agency:     HH Arranged:    HH Agency:     Status of Service:  In process, will continue to follow  Medicare Important Message Given:    Date Medicare IM Given:    Medicare IM give by:    Date Additional Medicare IM Given:    Additional Medicare Important Message give by:     If discussed at Long Length of Stay Meetings, dates discussed:    Additional Comments:  Quintella BatonJulie W. Griffey Nicasio, RN, BSN  Trauma/Neuro ICU Case Manager [phone]

## 2014-07-06 DIAGNOSIS — I6931 Cognitive deficits following cerebral infarction: Secondary | ICD-10-CM

## 2014-07-06 DIAGNOSIS — I619 Nontraumatic intracerebral hemorrhage, unspecified: Secondary | ICD-10-CM | POA: Diagnosis present

## 2014-07-06 DIAGNOSIS — I69319 Unspecified symptoms and signs involving cognitive functions following cerebral infarction: Secondary | ICD-10-CM

## 2014-07-06 DIAGNOSIS — G8100 Flaccid hemiplegia affecting unspecified side: Secondary | ICD-10-CM

## 2014-07-06 DIAGNOSIS — G8191 Hemiplegia, unspecified affecting right dominant side: Secondary | ICD-10-CM

## 2014-07-06 DIAGNOSIS — I10 Essential (primary) hypertension: Secondary | ICD-10-CM | POA: Diagnosis present

## 2014-07-06 DIAGNOSIS — I615 Nontraumatic intracerebral hemorrhage, intraventricular: Secondary | ICD-10-CM

## 2014-07-06 HISTORY — DX: Essential (primary) hypertension: I10

## 2014-07-06 HISTORY — DX: Hemiplegia, unspecified affecting right dominant side: G81.91

## 2014-07-06 HISTORY — DX: Nontraumatic intracerebral hemorrhage, unspecified: I61.9

## 2014-07-06 LAB — BASIC METABOLIC PANEL
Anion gap: 12 (ref 5–15)
BUN: 21 mg/dL — AB (ref 6–20)
CO2: 23 mmol/L (ref 22–32)
CREATININE: 1.14 mg/dL (ref 0.61–1.24)
Calcium: 9.6 mg/dL (ref 8.9–10.3)
Chloride: 102 mmol/L (ref 101–111)
GFR calc Af Amer: >60 mL/min (ref >60)
Glucose, Bld: 99 mg/dL (ref 65–99)
POTASSIUM: 4 mmol/L (ref 3.5–5.1)
Sodium: 137 mmol/L (ref 135–145)

## 2014-07-06 LAB — CBC
HEMATOCRIT: 46.9 % (ref 39.0–52.0)
Hemoglobin: 16.9 g/dL (ref 13.0–17.0)
MCH: 31.7 pg (ref 26.0–34.0)
MCHC: 36 g/dL (ref 30.0–36.0)
MCV: 88 fL (ref 78.0–100.0)
PLATELETS: 133 10*3/uL — AB (ref 150–400)
RBC: 5.33 MIL/uL (ref 4.22–5.81)
RDW: 14.1 % (ref 11.5–15.5)
WBC: 9 10*3/uL (ref 4.0–10.5)

## 2014-07-06 LAB — HEMOGLOBIN A1C
HEMOGLOBIN A1C: 5.8 % — AB (ref 4.8–5.6)
Mean Plasma Glucose: 120 mg/dL

## 2014-07-06 MED ORDER — HEPARIN SODIUM (PORCINE) 5000 UNIT/ML IJ SOLN
5000.0000 [IU] | Freq: Three times a day (TID) | INTRAMUSCULAR | Status: DC
  Administered 2014-07-06 – 2014-07-07 (×3): 5000 [IU] via SUBCUTANEOUS
  Filled 2014-07-06 (×3): qty 1

## 2014-07-06 MED ORDER — LABETALOL HCL 5 MG/ML IV SOLN: 10.0000 mg | INTRAVENOUS | Status: DC | PRN

## 2014-07-06 NOTE — Progress Notes (Signed)
Occupational Therapy Evaluation Patient Details Name: Michael Goodwin MRN: [id number] DOB: 08-10-1945 Today's Date: 07/06/2014    History of Present Illness 69 y.o. male with history of hypertension presenting after being found down with confusion and right sided weakness. CT head imaging reviewed shows left ICH with intraventricular extension CT -Evolving LEFT thalamic 3.3 x 2.3 cm hematoma with intraventricular extension.   Clinical Impression   PTA, pt lived alone and was independent with ADL and functional mobility. Pt presents with significant functional decline due to below deficits. Pt is an excellent CIR candidate to maximize functional level of independence with ADL and functional mobility for ADL. Unsure of family support. Pt will most likely need 24/7 S after D/C from rehab. Will follow acutely to address established goals and facilitate D/C to next venue of care.     Follow Up Recommendations  CIR;Supervision/Assistance - 24 hour    Equipment Recommendations  3 in 1 bedside comode;Tub/shower bench    Recommendations for Other Services Rehab consult     Precautions / Restrictions Precautions Precautions: Fall Restrictions Weight Bearing Restrictions: No      Mobility Bed Mobility Overal bed mobility: +2 for physical assistance;Needs Assistance Bed Mobility: Supine to Sit;Sit to Supine     Supine to sit: Max assist;+2 for physical assistance Sit to supine: Max assist;+2 for physical assistance   General bed mobility comments: Pt attempting to get OOB moving L side. Unable to problem solve to assist with moving R side.   Transfers Overall transfer level: Needs assistance   Transfers: Sit to/from Stand Sit to Stand: +2 physical assistance;Mod assist         General transfer comment: Pt able to facilitate forward weight shift for sit to stand. Noted motor impersistence RLE in standing. tactile cues to maintain upright midline postural control. L bias. Looking L  in standing.     Balance Overall balance assessment: Needs assistance Sitting-balance support: Feet supported;Bilateral upper extremity supported Sitting balance-Leahy Scale: Poor Sitting balance - Comments: R bias   Standing balance support: During functional activity;Bilateral upper extremity supported Standing balance-Leahy Scale: Zero                              ADL Overall ADL's : Needs assistance/impaired Eating/Feeding: Moderate assistance Eating/Feeding Details (indicate cue type and reason): modified diet. full S Grooming: Moderate assistance Grooming Details (indicate cue type and reason): able to bring cloth to face; cues to attend and complete task Upper Body Bathing: Maximal assistance;Sitting   Lower Body Bathing: Maximal assistance;Sit to/from stand   Upper Body Dressing : Total assistance   Lower Body Dressing: Total assistance       Toileting- Clothing Manipulation and Hygiene: Total assistance       Functional mobility during ADLs: +2 for physical assistance;Moderate assistance (sit - stand only) General ADL Comments: Pt lethargic during eval requiring vc to increase level of arousal throughout session. Fatigued at end of session. Apparent perseveration during ADL tasks     Vision Vision Assessment?: Vision impaired- to be further tested in functional context Additional Comments: L gaze prefence. poor visual fixation. Able to look into R field, but unable to maintain for greater than 10-15 seconds. Would not maintain visual attention during conversation.    Perception Perception Perception Tested?:  (will further assess)  Undershooting when reaching for objects. R inattention   Praxis Praxis Praxis tested?: Deficits Deficits: Initiation;Perseveration;Ideomotor Praxis-Other Comments: perseveration noted. ? affect of lethargy.  will further assess    Pertinent Vitals/Pain Pain Assessment: Faces Faces Pain Scale: No hurt     Hand  Dominance Right   Extremity/Trunk Assessment Upper Extremity Assessment Upper Extremity Assessment: RUE deficits/detail RUE Deficits / Details: R inattention. Pt with spontaneous movement RUE during functional tasks - greater movement proximally. Trying to use RUE to ash face. Unable to grasp and hold cloth without hand over hand assist. gross movement patterns in flexio RUE Sensation:  (appesr diminished) RUE Coordination: decreased fine motor;decreased gross motor (unable to use as gross assist. unable to grasp/release objec)   Lower Extremity Assessment Lower Extremity Assessment: Defer to PT evaluation   Cervical / Trunk Assessment Cervical / Trunk Assessment: Other exceptions Cervical / Trunk Exceptions: R lateral lean   Communication Communication Communication: Expressive difficulties; dysarthric; pt lethargic   Cognition Arousal/Alertness: Lethargic Behavior During Therapy: Flat affect;Impulsive;Restless Overall Cognitive Status: Impaired/Different from baseline Area of Impairment: Orientation;Attention;Following commands;Safety/judgement;Awareness;Problem solving Orientation Level: Disoriented to;Place;Time;Situation Current Attention Level: Focused   Following Commands: Follows one step commands inconsistently Safety/Judgement: Decreased awareness of safety;Decreased awareness of deficits Awareness: Intellectual Problem Solving: Slow processing;Decreased initiation;Difficulty sequencing;Requires verbal cues;Requires tactile cues     General Comments   good participation. Pt stated "I just can't move my right side"                 Home Living Family/patient expects to be discharged to:: Inpatient rehab Living Arrangements: Alone Available Help at Discharge: Other (Comment) (unsure) Type of Home: House                           Additional Comments: unable to get specific information about the house due to lethargy  Lives With: Alone    Prior  Functioning/Environment Level of Independence: Independent             OT Diagnosis: Generalized weakness;Cognitive deficits;Disturbance of vision;Hemiplegia dominant side;Apraxia   OT Problem List: Decreased strength;Decreased range of motion;Decreased activity tolerance;Impaired balance (sitting and/or standing);Impaired vision/perception;Decreased coordination;Decreased cognition;Decreased safety awareness;Decreased knowledge of use of DME or AE;Decreased knowledge of precautions;Impaired sensation;Impaired tone;Impaired UE functional use   OT Treatment/Interventions: Self-care/ADL training;Therapeutic exercise;Neuromuscular education;DME and/or AE instruction;Therapeutic activities;Cognitive remediation/compensation;Visual/perceptual remediation/compensation;Patient/family education;Balance training    OT Goals(Current goals can be found in the care plan section) Acute Rehab OT Goals Patient Stated Goal: none stated OT Goal Formulation: Patient unable to participate in goal setting Time For Goal Achievement: 07/20/14 Potential to Achieve Goals: Good ADL Goals Pt Will Perform Eating: with supervision;with set-up;sitting Pt Will Perform Grooming: with set-up;with supervision;sitting Pt Will Perform Upper Body Bathing: with min assist;sitting Pt Will Transfer to Toilet: with min assist;bedside commode;stand pivot transfer Additional ADL Goal #1: Pt will maintain midline postural control EOB with minguard in preparation for ADL Additional ADL Goal #2: Pt will demonstrate emergent awareness for ADL tasks.  OT Frequency: Min 3X/week   Barriers to D/C: Other (comment) (unsure of caregiver support)          Co-evaluation              End of Session Nurse Communication: Mobility status  Activity Tolerance: Patient tolerated treatment well Patient left: in bed;with call bell/phone within reach   Time: 1028-1052 OT Time Calculation (min): 24 min Charges:  OT General  Charges $OT Visit: 1 Procedure OT Evaluation $Initial OT Evaluation Tier I: 1 Procedure G-Codes:    Michael Goodwin,Michael Goodwin 07-26-14, 11:17 AM   Luisa Dago, OTR/L  [phone] 07/26/14

## 2014-07-06 NOTE — Clinical Social Work Note (Signed)
CSW received [facility] [place] confirming [place] pt at bed. CSW will continue to follow and assist.   Nicklaus Alviar, MSW, LCSWA [phone]

## 2014-07-06 NOTE — Progress Notes (Signed)
OT Cancellation Note  Patient Details Name: Michael Goodwin MRN: [id number] DOB: 07/19/1945   Cancelled Treatment:    Reason Eval/Treat Not Completed: Patient not medically ready Pt on bedrest. Please update activity orders when appropriate to initiate therapy. Thanks [facility],HILL[facility]  Jaleigha Deane, OTR/L  [phone] 07/06/2014 07/06/2014, 8:21 AM

## 2014-07-06 NOTE — Progress Notes (Signed)
Patient awake, attempting to crawl out of bed. States " I am going to my sisters house. " Reoriented and redirected easily. ABC's intact, personal cares denied

## 2014-07-06 NOTE — Progress Notes (Signed)
STROKE TEAM PROGRESS NOTE   SUBJECTIVE (INTERVAL HISTORY) No family is at the bedside.  Overall he feels his condition is stable. His BP around 130s and was received one dose of labetalol. Neuro stable, no overnight issue.   OBJECTIVE Temp:  [97.4 F (36.3 C)-98.8 F (37.1 C)] 98.3 F (36.8 C) (05/26 1415) Pulse Rate:  [61-98] 82 (05/26 1415) Cardiac Rhythm:  [-] Normal sinus rhythm;Sinus tachycardia (05/26 0700) Resp:  [15-26] 18 (05/26 1415) BP: (110-160)/(74-110) 130/81 mmHg (05/26 1415) SpO2:  [91 %-100 %] 91 % (05/26 1415)   Recent Labs Lab 07/04/14 1917 07/04/14 2014 07/05/14 0002 07/05/14 0351  GLUCAP 134* 129* 131* 120*    Recent Labs Lab 07/04/14 1831 07/04/14 1835 07/05/14 0855 07/06/14 0220  NA 137 137 139 137  K 3.4* 3.4* 3.7 4.0  CL 100* 100* 102 102  CO2 22  --  26 23  GLUCOSE 158* 161* 105* 99  BUN 13 17 19  21*  CREATININE 1.39* 1.30* 1.29* 1.14  CALCIUM 9.9  --  10.0 9.6    Recent Labs Lab 07/04/14 1831  AST 45*  ALT 15*  ALKPHOS 60  BILITOT 0.9  PROT 8.0  ALBUMIN 4.2    Recent Labs Lab 07/04/14 1831 07/04/14 1835 07/05/14 0855 07/06/14 0220  WBC 11.0*  --  9.8 9.0  NEUTROABS 9.3*  --   --   --   HGB 13.6 15.3 13.6 16.9  HCT 40.0 45.0 40.1 46.9  MCV 87.3  --  87.7 88.0  PLT 156  --  172 133*   No results for input(s): CKTOTAL, CKMB, CKMBINDEX, TROPONINI in the last 168 hours.  Recent Labs  07/04/14 1831  LABPROT 15.7*  INR 1.24    Recent Labs  07/04/14 2057  COLORURINE ORANGE*  LABSPEC 1.028  PHURINE 5.5  GLUCOSEU 100*  HGBUR LARGE*  BILIRUBINUR SMALL*  KETONESUR 15*  PROTEINUR 100*  UROBILINOGEN 1.0  NITRITE NEGATIVE  LEUKOCYTESUR SMALL*       Component Value Date/Time   CHOL 162 07/05/2014 0855   TRIG 62 07/05/2014 0855   HDL 95 07/05/2014 0855   CHOLHDL 1.7 07/05/2014 0855   VLDL 12 07/05/2014 0855   LDLCALC 55 07/05/2014 0855   Lab Results  Component Value Date   HGBA1C 5.8* 07/05/2014       Component Value Date/Time   LABOPIA NONE DETECTED 07/04/2014 2057   COCAINSCRNUR NONE DETECTED 07/04/2014 2057   LABBENZ NONE DETECTED 07/04/2014 2057   AMPHETMU NONE DETECTED 07/04/2014 2057   THCU NONE DETECTED 07/04/2014 2057   LABBARB NONE DETECTED 07/04/2014 2057     Recent Labs Lab 07/04/14 1831  ETH <5    I have personally reviewed the radiological images below and agree with the radiology interpretations.  Ct Head Wo Contrast  07/05/2014   IMPRESSION: Evolving LEFT thalamic 3.3 x 2.3 cm hematoma with intraventricular extension. 4 mm LEFT-to-RIGHT midline shift. Mild hydrocephalus suspected.  Moderate white matter changes suggest chronic small vessel ischemic disease.   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   Carotid Doppler  Mild to moderate plaque carotid bifurcations bilaterally. ICA velocities and ratios within the 1-39% range of stenosis bilaterally. Vertebral arteries are patent and antegrade bilaterally.  2D Echocardiogram  - Left ventricle: The cavity size was normal. Wall thickness was increased in a pattern of moderate LVH. The estimated ejection fraction was  60%. Wall motion was normal; there were no regional wall motion abnormalities. - Aortic valve: Sclerosis without stenosis. There was trivial regurgitation. 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 Aortic root dimension: 48 mm (ED). Ascending aortic diameter: 50 mm (S). This needs to be followed. - Right ventricle: The cavity size was normal. Systolic function was normal.  EKG  normal sinus rhythm. For complete results please see formal report.  PHYSICAL EXAM  Temp:  [97.4 F (36.3 C)-98.8 F (37.1 C)] 98.3 F (36.8 C) (05/26 1415) Pulse Rate:  [61-98] 82 (05/26  1415) Resp:  [15-26] 18 (05/26 1415) BP: (110-160)/(74-110) 130/81 mmHg (05/26 1415) SpO2:  [91 %-100 %] 91 % (05/26 1415)  General - Well nourished, well developed, in no apparent distress.  Ophthalmologic - fundi not visualized due to noncooperation.  Cardiovascular - Regular rate and rhythm.  Mental Status -  Awake alert but only orientated to time, place, and people. Moderate dysarthria, able to repeat, name 1/3 with transcortical motor aphasia.   Cranial Nerves II - XII - II - not blinking to visual threat to the right. III, IV, VI - left eye gaze preference but able to cross midline, mild disconjugation of eyes. V - Facial sensation intact bilaterally. VII - right facial droop. VIII - Hearing & vestibular intact bilaterally. X - moderate dysarthria. XI - Chin turning & shoulder shrug intact bilaterally. XII - Tongue protrusion not cooperative on exam.  Motor Strength - The patient's strength was 0/5 RUE and 2/5 RLE on pain stimulation. LUE and LLE spontaneous movement, good strength. Bulk was normal and fasciculations were absent.   Motor Tone - Muscle tone was assessed at the neck and appendages and was normal.  Reflexes - The patient's reflexes were symmetrical in all extremities and he had bilateral babinski's.  Sensory - Light touch, temperature/pinprick were assessed and were symmetrical.    Coordination - not cooperative on exam for ataxia or dysmetria.  Tremor was absent.  Gait and Station - not tested due to weakness.   ASSESSMENT/PLAN Mr. Curtez Brallier is a 69 y.o. male with history of HTN admitted for left BG hemorrhage with ventricular extension. Symptoms stable.    ICH:  Dominant left BG ICH with ventricular extension likely hypertensive  CT showed left BG ICH with ventricular extension  Repeat CT showed stable hematoma, 15cc in volume  Carotid Doppler unremarkable  2D Echo unremarkable  LDL 55  HgbA1c 5.8  Heparin subcutaneous for VTE  prophylaxis  DIET DYS 3 Room service appropriate?: Yes; Fluid consistency:: Nectar Thick    no antithrombotic prior to admission, now on no antithrombotic  Ongoing aggressive stroke risk factor management  Therapy recommendations:  pending  Disposition:  pending  Hypertension  Home meds:   Not clear BP goal < 160 Currently on lisinopril 20  Stable  Other Stroke Risk Factors  Advanced age  Other Active Problems    Other Pertinent History    Hospital day # 2   Marvel Plan, MD PhD Stroke Neurology 07/06/2014 5:18 PM    To contact Stroke Continuity provider, please refer to [email or web address]. After hours, contact General Neurology

## 2014-07-06 NOTE — Discharge Summary (Addendum)
Stroke Discharge Summary  Patient ID: Michael Goodwin    l   MRN: [id number]      DOB: 1945/03/02  Date of Admission: 07/04/2014 Date of Discharge: 07/07/2014  Attending Physician:  No att. 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 No pertinent past medical history. History reviewed. No pertinent past surgical history.  Medications to be continued on Rehab Norvasc 5 mg daily Subcutaneous heparin as DVT prophylaxis Lisinopril 20 mg twice daily Protonix 40 mg daily Resource thickener     LABORATORY STUDIES CBC    Component Value Date/Time   WBC 9.4 07/07/2014 0430   RBC 4.50 07/07/2014 0430   HGB 13.8 07/07/2014 0430   HCT 40.7 07/07/2014 0430   PLT 187 07/07/2014 0430   MCV 90.4 07/07/2014 0430   MCH 30.7 07/07/2014 0430   MCHC 33.9 07/07/2014 0430   RDW 14.0 07/07/2014 0430   LYMPHSABS 1.0 07/04/2014 1831   MONOABS 0.7 07/04/2014 1831   EOSABS 0.0 07/04/2014 1831   BASOSABS 0.0 07/04/2014 1831   CMP    Component Value Date/Time   NA 138 07/07/2014 0430   K 3.4* 07/07/2014 0430   CL 103 07/07/2014 0430   CO2 27 07/07/2014 0430   GLUCOSE 118* 07/07/2014 0430   BUN 21* 07/07/2014 0430   CREATININE 1.16 07/07/2014 0430   CALCIUM 9.8 07/07/2014 0430   PROT 8.0 07/04/2014 1831   ALBUMIN 4.2 07/04/2014 1831   AST 45* 07/04/2014 1831   ALT 15* 07/04/2014 1831   ALKPHOS 60 07/04/2014 1831    BILITOT 0.9 07/04/2014 1831   GFRNONAA >60 07/07/2014 0430   GFRAA >60 07/07/2014 0430   COAGS Lab Results  Component Value Date   INR 1.24 07/04/2014   Lipid Panel    Component Value Date/Time   CHOL 162 07/05/2014 0855   TRIG 62 07/05/2014 0855   HDL 95 07/05/2014 0855   CHOLHDL 1.7 07/05/2014 0855   VLDL 12 07/05/2014 0855   LDLCALC 55 07/05/2014 0855   HgbA1C  Lab Results  Component Value Date   HGBA1C 5.8* 07/05/2014   Cardiac Panel (last 3 results) No results for input(s): CKTOTAL, CKMB, TROPONINI, RELINDX in the last 72 hours. Urinalysis    Component Value Date/Time   COLORURINE ORANGE* 07/04/2014 2057   APPEARANCEUR CLOUDY* 07/04/2014 2057   LABSPEC 1.028 07/04/2014 2057   PHURINE 5.5 07/04/2014 2057   GLUCOSEU 100* 07/04/2014 2057   HGBUR LARGE* 07/04/2014 2057   BILIRUBINUR SMALL* 07/04/2014 2057   KETONESUR 15* 07/04/2014 2057   PROTEINUR 100* 07/04/2014 2057   UROBILINOGEN 1.0 07/04/2014 2057   NITRITE NEGATIVE 07/04/2014 2057   LEUKOCYTESUR SMALL* 07/04/2014 2057   Urine Drug Screen     Component Value Date/Time   LABOPIA NONE DETECTED 07/04/2014 2057   COCAINSCRNUR NONE DETECTED 07/04/2014 2057   LABBENZ NONE DETECTED 07/04/2014 2057   AMPHETMU NONE DETECTED 07/04/2014 2057   THCU NONE DETECTED 07/04/2014 2057  LABBARB NONE DETECTED 07/04/2014 2057    Alcohol Level    Component Value Date/Time   ETH <5 07/04/2014 1831     SIGNIFICANT DIAGNOSTIC STUDIES Ct Head Wo Contrast 07/05/2014 Evolving LEFT thalamic 3.3 x 2.3 cm hematoma with intraventricular extension. 4 mm LEFT-to-RIGHT midline shift. Mild hydrocephalus suspected. Moderate white matter changes suggest chronic small vessel ischemic disease.  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   Carotid Doppler Mild to moderate  plaque carotid bifurcations bilaterally. ICA velocities and ratios within the 1-39% range of stenosis bilaterally. Vertebral arteries are patent and antegrade bilaterally.  2D Echocardiogram  - Left ventricle: The cavity size was normal. Wall thickness wasincreased in a pattern of moderate LVH. The estimated ejectionfraction was 60%. Wall motion was normal; there were no regionalwall motion abnormalities. - Aortic valve: Sclerosis without stenosis. There was trivialregurgitation. - Aorta: There is significant dilitation of the aortic root andascending aorta. Aortic root dimension: 48 mm (ED). Ascendingaortic diameter: 50 mm (S). This needs to be followed. - Right ventricle: The cavity size was normal. Systolic functionwas normal.  EKG normal sinus rhythm. For complete results please see formal report.     HISTORY OF PRESENT ILLNES Michael Goodwin is an 69 y.o. male with history of hypertension presenting after being found down with confusion and right sided weakness. Unclear last seen well (LKW 07/04/2014). Per EMS, his neighbor reports seeing the patient around "lunch" and states he was in normal state of health then. Later in the afternoon he was found down and noted to have confusion and not moving his right side. CT head imaging reviewed shows left ICH with intraventricular extension. tPA not given secondary to ICH.    HOSPITAL COURSE [name] is a 69 y.o. male with history of HTN admitted for left BG hemorrhage with ventricular extension. Symptoms stable.   ICH: Dominant left basal ganglia ICH with intra ventricular extension likely hypertensive  Resultant Right field cut, disconjugate eyes, R facial, dysarthria, dysphagia Monitored in ICU CT showed left BG ICH with intraventricular extension  Repeat CT showed stable hematoma, 15cc in volume  Carotid Doppler unremarkable  2D Echo unremarkable  LDL 55  HgbA1c 5.8  no antithrombotic prior to admission Ongoing aggressive  stroke risk factor management  Therapy recommendations: CIR Disposition: CIR  Hypertension  Home meds: Not clear Currently on lisinopril 20 Stable  Other Stroke Risk Factors  Advanced age   Other problems  Hypokalemia  Mildly elevated BUN  Mildly elevated liver function tests   DISCHARGE EXAM Blood pressure 174/85, pulse 96, temperature 98.8 F (37.1 C), temperature source Axillary, resp. rate 20, height 6' (1.829 m), weight 64.774 kg (142 lb 12.8 oz), SpO2 100 %. General - Well nourished, well developed, in no apparent distress.  Ophthalmologic - fundi not visualized due to noncooperation.  Cardiovascular - Regular rate and rhythm.  Mental Status -  Awake alert but only orientated to time, place, and people. Moderate dysarthria, able to repeat, name 1/3 with transcortical motor aphasia.   Cranial Nerves II - XII - II - not blinking to visual threat to the right. III, IV, VI - left eye gaze preference but able to cross midline, mild disconjugation of eyes. V - Facial sensation intact bilaterally. VII - right facial droop. VIII - Hearing & vestibular intact bilaterally. X - moderate dysarthria. XI - Chin turning & shoulder shrug intact bilaterally. XII -  Tongue protrusion not cooperative on exam.  Motor Strength - The patient's strength was 0/5 RUE and 2/5 RLE on pain stimulation. LUE and LLE spontaneous movement, good strength. Bulk was normal and fasciculations were absent.  Motor Tone - Muscle tone was assessed at the neck and appendages and was normal.  Reflexes - The patient's reflexes were symmetrical in all extremities and he had bilateral babinski's.  Sensory - Light touch, temperature/pinprick were assessed and were symmetrical.   Coordination - not cooperative on exam for ataxia or dysmetria. Tremor was absent.  Gait and Station - not tested due to weakness.  Discharge Diet  DIET DYS 3 Room service appropriate?: Yes; Fluid consistency:: Thin  liquids  DISCHARGE PLAN  Disposition:  Transfer to [facility] for ongoing PT, OT and ST  Due to hemorrhage and risk of bleeding, do not take aspirin, aspirin-containing medications, or ibuprofen products   Recommend ongoing risk factor control by Primary Care Physician at time of discharge from inpatient rehabilitation.  Follow-up No PCP Per Patient in 2 weeks following discharge from rehab.  Follow-up with Dr. Marvel Plan, Stroke Clinic in 2 months.   35 minutes were spent preparing discharge.  Delton See PA-C [facility] Pager [phone] 07/07/2014, 6:47 PM  I, the attending vascular neurologist, have personally obtained a history, examined the patient, evaluated laboratory data, individually viewed imaging studies and agree with radiology interpretations.  Together with the NP/PA, we formulated the assessment and plan of care which reflects our mutual decision.  I have made any additions or clarifications directly to the above note and agree with the findings and plan as currently documented.     Marvel Plan, MD PhD Stroke Neurology 07/08/2014 6:31 AM

## 2014-07-06 NOTE — Clinical Social Work Note (Signed)
Clinical Social Work Assessment  Patient Details  Name: Marguarite ArbourClyde Tippen MRN: [id number] Date of Birth: [birth year]  Date of referral:  07/06/14               Reason for consult:  Facility Placement, Discharge Planning                Permission sought to share information with:  Family Supports Permission granted to share information::  Yes, Verbal Permission Granted  Name::     Drucilla ChaletKatie Love  Relationship::  Daughter  Contact Information:  [phone]  Housing/Transportation Living arrangements for the past 2 months:  Single Family Home Source of Information:  Adult Children Patient Interpreter Needed:  None Criminal Activity/Legal Involvement Pertinent to Current Situation/Hospitalization:  No - Comment as needed Significant Relationships:  Adult Children Lives with:  Self Do you feel safe going back to the place where you live?  No Need for family participation in patient care:  Yes (Comment)  Care giving concerns:   Spoke with patient daughter over the phone who states that she currently works at [employer] [employer] [employer] in [place] and would like to pursue placement only at this facility.  Patient daughter plans to communicate with facility social worker regarding eligibility for Medicaid.   Social Worker assessment / plan:  Visual merchandiserClinical Social Worker spoke with patient daughter to offer support and discuss patient needs at discharge.  Patient daughter states that patient was living home alone prior to admission but has a lot of family support.  Patient daughter is working at [employer] [facility] and would like to pursue placement.  CSW has completed FL2 and initiated referral to facility for possible bed offer.  CSW to follow up with patient family regarding bed offer once available.  CSW remains available for support and to facilitate patient discharge needs.  Employment status:  Retired PT Recommendations:  Skilled Holiday representativeursing Facility, Inpatient Rehab Consult Information /  Referral to community resources:  Skilled Nursing Facility  Patient/Family's Response to care:  Patient daughter appreciative of CSW support and involvement to pursue SNF placement.  Patient daughter agreeable with SNF placement at discharge.  Patient/Family's Understanding of and Emotional Response to Diagnosis, Current Treatment, and Prognosis:  Patient daughter verbalizes understanding of patient condition and the need for rehab at discharge.  Patient daughter aware that patient may have extensive long term needs and plans to pursue Medicaid application.  Emotional Assessment Appearance:  Disheveled Attitude/Demeanor/Rapport:  Unable to Assess Affect (typically observed):  Unable to Assess Orientation:  Oriented to Self Alcohol / Substance use:  Not Applicable Psych involvement (Current and /or in the community):  No (Comment)  Discharge Needs  Concerns to be addressed:  Care Coordination, Discharge Planning Concerns Readmission within the last 30 days:  No Current discharge risk:  Dependent with Mobility, Cognitively Impaired, Lack of support system Barriers to Discharge:  Continued Medical Work up  MetLifeJesse Caris Cerveny, Kentucky[place]CSW [phone]

## 2014-07-06 NOTE — Progress Notes (Signed)
Report given to Boneta LucksJenny, RN 4N.

## 2014-07-06 NOTE — Progress Notes (Signed)
Speech Language Pathology Treatment: Dysphagia;Cognitive-Linquistic  Patient Details Name: Marguarite ArbourClyde Saner MRN: [id number] DOB: 1945-03-09 Today's Date: 07/06/2014 Time: [id number] SLP Time Calculation (min) (ACUTE ONLY): 11 min  Assessment / Plan / Recommendation Clinical Impression  Pt seen during lunch meal for f/u dysphagia and dysarthria treatment. Upon SLP arrival he was asleep, and was sleepy upon waking and throughout intake - therefore, session kept brief. He consumed soft and chopped solids with Mod cues for clearance of oral residue, most successful with liquid wash. Strong, delayed cough observed x1 throughout intake. Mod cues were provided for increased intelligibility via means of increased vocal intensity and pausing between words. Will continue to follow for additional therapy and to determine readiness to advance when more alert.   HPI Other Pertinent Information: 69 yo male admitted with left ICH and right sided weakness.   Pertinent Vitals Pain Assessment: No/denies pain Faces Pain Scale: No hurt  SLP Plan  Continue with current plan of care    Recommendations Diet recommendations: Dysphagia 3 (mechanical soft);Thin liquid;Other(comment) (chopped meats) Liquids provided via: Cup;Straw Medication Administration: Whole meds with puree Supervision: Patient able to self feed;Full supervision/cueing for compensatory strategies Compensations: Slow rate;Small sips/bites;Check for pocketing Postural Changes and/or Swallow Maneuvers: Seated upright 90 degrees;Upright 30-60 min after meal       Oral Care Recommendations: Oral care BID;Other (Comment) (oral care after PO) Follow up Recommendations: Inpatient Rehab Plan: Continue with current plan of care     Maxcine HamLaura Paiewonsky, M.A. CCC-SLP [phone]  Maxcine Hamaiewonsky, Mega Kinkade 07/06/2014, 12:10 PM

## 2014-07-06 NOTE — Clinical Social Work Placement (Signed)
   CLINICAL SOCIAL WORK PLACEMENT  NOTE  Date:  07/06/2014  Patient Details  Name: Michael Goodwin MRN: [id number] Date of Birth: 08/12/1945  Clinical Social Work is seeking post-discharge placement for this patient at the Skilled  Nursing Facility level of care (*CSW will initial, date and re-position this form in  chart as items are completed):  Yes   Patient/family provided with [facility] Clinical Social Work Department's list of facilities offering this level of care within the geographic area requested by the patient (or if unable, by the patient's family).  Yes   Patient/family informed of their freedom to choose among providers that offer the needed level of care, that participate in Medicare, Medicaid or managed care program needed by the patient, have an available bed and are willing to accept the patient.  Yes   Patient/family informed of [facility]'s ownership interest in [facility] and [facility], as well as of the fact that they are under no obligation to receive care at these facilities.  PASRR submitted to EDS on 07/06/14     PASRR number received on 07/06/14     Existing PASRR number confirmed on       FL2 transmitted to all facilities in geographic area requested by pt/family on 07/06/14     FL2 transmitted to all facilities within larger geographic area on       Patient informed that his/her managed care company has contracts with or will negotiate with certain facilities, including the following:            Patient/family informed of bed offers received.  Patient chooses bed at       Physician recommends and patient chooses bed at      Patient to be transferred to   on  .  Patient to be transferred to facility by       Patient family notified on   of transfer.  Name of family member notified:        PHYSICIAN       Additional Comment:    Macario GoldsJesse Airrion Otting, LCSW [phone]

## 2014-07-06 NOTE — Progress Notes (Signed)
Rehab Admissions Coordinator Note:  Patient was screened by Bernardina Cacho L for appropriateness for an Inpatient Acute Rehab Consult.  At this time, we are recommending Inpatient Rehab consult.  Sher Shampine L 07/06/2014, 11:29 AM  I can be reached at [phone].

## 2014-07-06 NOTE — Progress Notes (Signed)
I spoke with pt's daughter, Florentina AddisonKatie, by phone. PTA pt did live alone, but she states she and her brother can work out 24/7 supervision at d/c. I explained that I will clarify bed availability for inpt rehab for Friday and Saturday, to clarify his options for rehab that are available to him. I have arranged to meet with daughter and pt Friday at 10 am. [phone]

## 2014-07-06 NOTE — Consult Note (Signed)
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 Roda ShuttersXu   HPI: Michael Goodwin is a 69 y.o. right handed male with history of hypertension on no prescription medications. Patient lives alone independent prior to admission. Presented 07/04/2014 with right-sided weakness and altered mental status. CT the head showed left ICH with intraventricular extension. Blood pressure 173/103. Carotid Doppler showed no ICA stenosis. Echocardiogram with ejection fraction of 60% no wall motion abnormalities. Follow-up cranial CT scan showed evolving left thalamic 3.3 x 2.3 cm hematoma with 4 mm left-to-right midline shift mild hydrocephalus. Placed on intravenous labetalol. Mechanical soft nectar thick liquid diet. Physical therapy evaluation completed 07/06/2014 with recommendations of physical medicine rehabilitation consult  Patient has no pain complaints, denies any breathing issues, denies any numbness or tingling in the extremities Review of Systems  Gastrointestinal: Positive for constipation.  Musculoskeletal: Positive for myalgias.  Neurological: Positive for headaches.  All other systems reviewed and are negative.  History reviewed. No pertinent past medical history. History reviewed. No pertinent past surgical history. History reviewed. No pertinent family history. Social History:  reports that he has been smoking.  He does not have any smokeless tobacco history on file. He reports that he drinks alcohol. His drug history is not on file. Allergies: No Known Allergies No prescriptions prior to admission    Home: Home Living Family/patient expects to be discharged to:: Inpatient rehab Living Arrangements: Alone Available Help at Discharge: Other (Comment) (unsure) Type of Home: House Additional Comments: unable to get specific information about the house due to lethargy  Lives With: Alone    Functional History: Prior Function Level of Independence: Independent Functional Status:  Mobility: Bed Mobility Overal bed mobility: +2 for physical assistance, Needs Assistance Bed Mobility: Supine to Sit, Sit to Supine Supine to sit: Max assist, +2 for physical assistance Sit to supine: Max assist, +2 for physical assistance General bed mobility comments: Pt attempting to get OOB moving L side. Unable to problem solve to assist with moving R side.  Transfers Overall transfer level: Needs assistance Equipment used: 2 person hand held assist Transfers: Sit to/from Stand Sit to Stand: +2 physical assistance, Mod assist General transfer comment: Pt able to facilitate forward weight shift for sit to stand. Noted motor impersistence RLE in standing. tactile cues to maintain upright midline postural control and for L w/shift. L bias. Looking L in standing.  Ambulation/Gait General Gait Details: not tested    ADL: ADL Overall ADL's : Needs assistance/impaired Eating/Feeding: Moderate assistance Eating/Feeding Details (indicate cue type and reason): modified diet. full S Grooming: Moderate assistance Grooming Details (indicate cue type and reason): able to bring cloth to face; cues to attend and complete task Upper Body Bathing: Maximal assistance, Sitting Lower Body Bathing: Maximal assistance, Sit to/from stand Upper Body Dressing : Total assistance Lower Body Dressing: Total assistance Toileting- Clothing Manipulation and Hygiene: Total assistance Functional mobility during ADLs: +2 for physical assistance, Moderate assistance (sit - stand only) General ADL Comments: Pt lethargic during eval requiring vc to increase level of arousal throughout session. Fatigued at end of session. Apparent perseveration during ADL tasks  Cognition: Cognition Overall Cognitive Status: Impaired/Different from baseline Arousal/Alertness: Awake/alert Orientation Level: Oriented to person Memory:  Appears intact Awareness: Impaired Awareness Impairment: Intellectual impairment Problem Solving: Impaired Problem Solving Impairment: Verbal complex Safety/Judgment:  (TBD) Cognition Arousal/Alertness: Lethargic Behavior During Therapy: Flat affect, Impulsive, Restless Overall Cognitive Status: Impaired/Different from baseline Area  of Impairment: Orientation, Attention, Following commands, Safety/judgement, Awareness, Problem solving Orientation Level: Disoriented to, Place, Time, Situation Current Attention Level: Focused Following Commands: Follows one step commands inconsistently Safety/Judgement: Decreased awareness of safety, Decreased awareness of deficits Awareness: Intellectual Problem Solving: Slow processing, Decreased initiation, Difficulty sequencing, Requires verbal cues, Requires tactile cues  Blood pressure 130/81, pulse 82, temperature 98.3 F (36.8 C), temperature source Oral, resp. rate 18, height 6' (1.829 m), weight 64.774 kg (142 lb 12.8 oz), SpO2 91 %. Physical Exam  HENT:  Mild right facial droop  Eyes:  Pupils reactive to light  Neck: Normal range of motion. Neck supple. No thyromegaly present.  Cardiovascular: Normal rate and regular rhythm.   Respiratory: Effort normal and breath sounds normal. No respiratory distress.  GI: Soft. Bowel sounds are normal. He exhibits no distension.  Neurological: He is alert.  Left eye gaze preference. Severe dysarthria but intelligible for most words. He was able to provide his name but needed cues for date of birth. He could not state his age. Followed simple commands. Limited awareness of deficits  Skin: Skin is warm and dry.  Right central 7 Motor strength is trace in the low right biceps and triceps and grip and finger extensors Right lower extremity to minus hip flexion 3 minus knee extension 3 minus ankle dorsiflexion plantarflexion 4+/5 in left deltoid, biceps, triceps, grip, hip flexor, knee extensor, ankle  dorsiflexor and plantar flexor Patient is difficult to assess for visual field secondary to decreased attention and lethargy. He does appear to have right neglect and possible right homonymous hemianopsia Withdraws to pinch on right side but Delayed compared to the left side  Results for orders placed or performed during the hospital encounter of 07/04/14 (from the past 24 hour(s))  CBC     Status: Abnormal   Collection Time: 07/06/14  2:20 AM  Result Value Ref Range   WBC 9.0 4.0 - 10.5 K/uL   RBC 5.33 4.22 - 5.81 MIL/uL   Hemoglobin 16.9 13.0 - 17.0 g/dL   HCT 16.1 09.6 - 04.5 %   MCV 88.0 78.0 - 100.0 fL   MCH 31.7 26.0 - 34.0 pg   MCHC 36.0 30.0 - 36.0 g/dL   RDW 40.9 81.1 - 91.4 %   Platelets 133 (L) 150 - 400 K/uL  Basic metabolic panel     Status: Abnormal   Collection Time: 07/06/14  2:20 AM  Result Value Ref Range   Sodium 137 135 - 145 mmol/L   Potassium 4.0 3.5 - 5.1 mmol/L   Chloride 102 101 - 111 mmol/L   CO2 23 22 - 32 mmol/L   Glucose, Bld 99 65 - 99 mg/dL   BUN 21 (H) 6 - 20 mg/dL   Creatinine, Ser 7.82 0.61 - 1.24 mg/dL   Calcium 9.6 8.9 - 95.6 mg/dL   GFR calc non Af Amer >60 >60 mL/min   GFR calc Af Amer >60 >60 mL/min   Anion gap 12 5 - 15   Ct Head Wo Contrast  07/05/2014   CLINICAL DATA:  Re-evaluate intracranial hemorrhage  EXAM: CT HEAD WITHOUT CONTRAST  TECHNIQUE: Contiguous axial images were obtained from the base of the skull through the vertex without intravenous contrast.  COMPARISON:  CT head Jul 04, 2014  FINDINGS: Evolving 3.3 x 2.3 cm LEFT thalamus/corona radiata hematoma with intraventricular extension, small amount of layering blood products in the occipital horns. Blood products extend to the anterior recess of third ventral, LEFT lateral ventricle with  mild, similar mild suspected hydrocephalus. Local mass effect, 4 mm LEFT-to-RIGHT midline shift.  Patchy to confluent supratentorial white matter hypodensities again noted. No acute large vascular  territory infarct. Tortuous calcified anterior cerebral artery. No acute large vascular territory infarct.  Basal cistern patent. No abnormal extra-axial fluid collections. Mild calcific atherosclerosis the carotid siphons.  Mild paranasal sinus mucosal thickening without air-fluid levels. The mastoid air cells are well aerated. No skull fracture. Ocular globes and orbital contents are unremarkable peer  IMPRESSION: Evolving LEFT thalamic 3.3 x 2.3 cm hematoma with intraventricular extension. 4 mm LEFT-to-RIGHT midline shift. Mild hydrocephalus suspected.  Moderate white matter changes suggest chronic small vessel ischemic disease.   Electronically Signed   By: Awilda Metro M.D.   On: 07/05/2014 06:20   Ct Head Wo Contrast  07/04/2014   CLINICAL DATA:  Code stroke.  Found down at nursing home.  EXAM: CT HEAD WITHOUT CONTRAST  TECHNIQUE: Contiguous axial images were obtained from the base of the skull through the vertex without intravenous contrast.  COMPARISON:  None.  FINDINGS: There is an at least 3.2 x 2.5 cm hemorrhage is located within the left thalamus (image 19, series 4) with resultant mass effect and approximately 6 mm of left-to-right midline shift. There is extension of this early intraparenchymal hemorrhage well the adjacent left lateral ventricular system (representative images 20 and 21, series 4 with trace amount of blood seen within the third ventricle (image 17, series 4).  Bilateral basal ganglial calcifications. Scattered periventricular hypodensities compatible microvascular ischemic disease. No CT evidence of acute large territory infarct. Punctate (approximately 3 mm) calcification about the anterior medial aspect of the left frontal lobe (image 18, series 4) is nonspecific with differential considerations including the sequela of prior infection or a vascular calcification. No definite intraparenchymal or extra-axial mass.  Scattered opacification of the right posterior ethmoidal air  cells. The remaining paranasal sinuses and mastoid air cells are normally aerated. No air-fluid levels.  Regional soft tissues appear normal. No displaced calvarial fracture.  IMPRESSION: Acute approximately 3.2 cm presumed hypertensive intraparenchymal left thalamic hemorrhage with intraventricular extension and a minimal (approximately 6 mm) of left-to-right midline shift.  Critical Value/emergent results were called by telephone at the time of interpretation on 07/04/2014 at 7:02 pm to Dr. Hosie Poisson, who verbally acknowledged these results.   Electronically Signed   By: Simonne Come M.D.   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 Does the need for close, 24 hr/day medical supervision in concert with the patient's rehab needs make it unreasonable for this patient to be served in a less intensive setting? Yes 2. Co-Morbidities requiring supervision/potential complications: Hypertension, cognitive deficits 3. Due to bladder management, bowel management, safety, skin/wound care, disease management, medication administration and patient education, does the patient require 24 hr/day rehab nursing? Yes 4. Does the patient require coordinated care of a physician, rehab nurse, PT (11-2 hrs/day, 5 days/week), OT (1-2 hrs/day, 5 days/week) and SLP (0.5-1 hrs/day, 5 days/week) to address physical and functional deficits in the context of the above medical diagnosis(es)? Yes Addressing deficits in the following areas: balance, endurance, locomotion, strength, transferring, bowel/bladder control, bathing, dressing, feeding, grooming, toileting, cognition, speech, language, swallowing, psychosocial support and safety 5. Can the patient actively participate in an intensive therapy program of at least 3 hrs of therapy per day at least 5 days per week? not currently but probably in the next 1-2  days 6. The potential for patient to make measurable gains while on inpatient rehab is good 7. Anticipated functional outcomes upon discharge from inpatient rehab are supervision  with PT, supervision with OT, supervision with SLP. 8. Estimated rehab length of stay to reach the above functional goals is: 18-21 days 9. Does the patient have adequate social supports and living environment to accommodate these discharge functional goals? Yes 10. Anticipated D/C setting: Home 11. Anticipated post D/C treatments: HH therapy 12. Overall Rehab/Functional Prognosis: good  RECOMMENDATIONS: This patient's condition is appropriate for continued rehabilitative care in the following setting: CIR Patient has agreed to participate in recommended program. Potentially Note that insurance prior authorization may be required for reimbursement for recommended care.  Comment: See above, anticipated need for 24 7 supervision post discharge    07/06/2014

## 2014-07-06 NOTE — Evaluation (Signed)
Physical Therapy Evaluation Patient Details Name: Michael Goodwin MRN: [id number] DOB: 1945-11-28 Today's Date: 07/06/2014    History of Present Illness  69 y.o. male with history of hypertension presenting after being found down with confusion and right sided weakness. CT head imaging reviewed shows left ICH with intraventricular extension Evolving LEFT thalamic 3.3 x 2.3 cm hematoma with intraventricular  Clinical Impression  Pt admitted with/for L ICH with R sided weakness.  Pt currently limited functionally due to the problems listed. ( See problems list.)   Pt will benefit from PT to maximize function and safety in order to get ready for next venue listed below.     Follow Up Recommendations CIR    Equipment Recommendations  Other (comment) (TBA)    Recommendations for Other Services       Precautions / Restrictions Precautions Precautions: Fall Restrictions Weight Bearing Restrictions: No      Mobility  Bed Mobility Overal bed mobility: +2 for physical assistance;Needs Assistance Bed Mobility: Supine to Sit;Sit to Supine     Supine to sit: Max assist;+2 for physical assistance Sit to supine: Max assist;+2 for physical assistance   General bed mobility comments: Pt attempting to get OOB moving L side. Unable to problem solve to assist with moving R side.   Transfers Overall transfer level: Needs assistance Equipment used: 2 person hand held assist Transfers: Sit to/from Stand Sit to Stand: +2 physical assistance;Mod assist         General transfer comment: Pt able to facilitate forward weight shift for sit to stand. Noted motor impersistence RLE in standing. tactile cues to maintain upright midline postural control and for L w/shift. L bias. Looking L in standing.   Ambulation/Gait             General Gait Details: not tested  Stairs            Wheelchair Mobility    Modified Rankin (Stroke Patients Only) Modified Rankin (Stroke Patients  Only) Pre-Morbid Rankin Score: No symptoms Modified Rankin: Severe disability     Balance Overall balance assessment: Needs assistance Sitting-balance support: Single extremity supported;Feet supported Sitting balance-Leahy Scale: Poor Sitting balance - Comments: R bias, pushing behaviors, but easily inhibited.   Standing balance support: During functional activity;Bilateral upper extremity supported Standing balance-Leahy Scale: Zero                               Pertinent Vitals/Pain Pain Assessment: Faces Faces Pain Scale: No hurt    Home Living Family/patient expects to be discharged to:: Inpatient rehab Living Arrangements: Alone Available Help at Discharge: Other (Comment) (unsure) Type of Home: House           Additional Comments: unable to get specific information about the house due to lethargy    Prior Function Level of Independence: Independent               Hand Dominance   Dominant Hand: Right    Extremity/Trunk Assessment   Upper Extremity Assessment: Defer to OT evaluation RUE Deficits / Details: R inattention. Pt with spontaneous movement RUE during functional tasks - greater movement proximally. Trying to use RUE to ash face. Unable to grasp and hold cloth without hand over hand assist. gross movement patterns in flexio   RUE Sensation:  (appesr diminished)     Lower Extremity Assessment: RLE deficits/detail RLE Deficits / Details: R inattension,  Some voluntary movement of quads to quick  out leg while dangling EOB. minor movement of toes, associated trace of hip flexion. No ham movement noted.    Cervical / Trunk Assessment: Other exceptions  Communication   Communication: Expressive difficulties  Cognition Arousal/Alertness: Lethargic Behavior During Therapy: Flat affect;Impulsive;Restless Overall Cognitive Status: Impaired/Different from baseline Area of Impairment: Orientation;Attention;Following  commands;Safety/judgement;Awareness;Problem solving Orientation Level: Disoriented to;Place;Time;Situation Current Attention Level: Focused   Following Commands: Follows one step commands inconsistently Safety/Judgement: Decreased awareness of safety;Decreased awareness of deficits Awareness: Intellectual Problem Solving: Slow processing;Decreased initiation;Difficulty sequencing;Requires verbal cues;Requires tactile cues      General Comments General comments (skin integrity, edema, etc.): Sitting BP 103/79, Supine BP 120/81, Standing HR up to 133.  Pt needs frequent redirection for inattention.    Exercises        Assessment/Plan    PT Assessment Patient needs continued PT services  PT Diagnosis Hemiplegia dominant side;Altered mental status   PT Problem List Decreased strength;Decreased activity tolerance;Decreased balance;Decreased mobility;Decreased coordination;Decreased knowledge of use of DME;Decreased knowledge of precautions;Impaired sensation  PT Treatment Interventions DME instruction;Functional mobility training;Therapeutic activities;Balance training;Patient/family education   PT Goals (Current goals can be found in the Care Plan section) Acute Rehab PT Goals Patient Stated Goal: none stated PT Goal Formulation: Patient unable to participate in goal setting Time For Goal Achievement: 07/20/14 Potential to Achieve Goals: Good    Frequency Min 3X/week   Barriers to discharge        Co-evaluation PT/OT/SLP Co-Evaluation/Treatment: Yes Reason for Co-Treatment: Complexity of the patient's impairments (multi-system involvement) PT goals addressed during session: Mobility/safety with mobility         End of Session   Activity Tolerance: Patient tolerated treatment well Patient left: with call bell/phone within reach;in bed Nurse Communication: Mobility status         Time: [id number] PT Time Calculation (min) (ACUTE ONLY): 24 min   Charges:   PT  Evaluation $Initial PT Evaluation Tier I: 1 Procedure     PT G Codes:        Sharvi Mooneyhan, Eliseo GumKenneth V 07/06/2014, 11:58 AM 07/06/2014  [facility] BingKen Osama Coleson, PT [phone]  (pager)

## 2014-07-07 ENCOUNTER — Inpatient Hospital Stay (HOSPITAL_COMMUNITY): Payer: Medicare Other

## 2014-07-07 ENCOUNTER — Inpatient Hospital Stay (HOSPITAL_COMMUNITY)
Admission: RE | Admit: 2014-07-07 | Discharge: 2014-08-01 | DRG: 057 | Disposition: A | Discharge status: Home Health | Payer: Medicare Other | Source: Intra-hospital | Attending: Physical Medicine & Rehabilitation | Admitting: Physical Medicine & Rehabilitation

## 2014-07-07 DIAGNOSIS — I69191 Dysphagia following nontraumatic intracerebral hemorrhage: Secondary | ICD-10-CM | POA: Diagnosis not present

## 2014-07-07 DIAGNOSIS — I69192 Facial weakness following nontraumatic intracerebral hemorrhage: Secondary | ICD-10-CM

## 2014-07-07 DIAGNOSIS — R8290 Unspecified abnormal findings in urine: Secondary | ICD-10-CM | POA: Diagnosis present

## 2014-07-07 DIAGNOSIS — G4700 Insomnia, unspecified: Secondary | ICD-10-CM | POA: Diagnosis not present

## 2014-07-07 DIAGNOSIS — I69122 Dysarthria following nontraumatic intracerebral hemorrhage: Secondary | ICD-10-CM | POA: Diagnosis not present

## 2014-07-07 DIAGNOSIS — I959 Hypotension, unspecified: Secondary | ICD-10-CM | POA: Diagnosis not present

## 2014-07-07 DIAGNOSIS — I77810 Thoracic aortic ectasia: Secondary | ICD-10-CM | POA: Diagnosis present

## 2014-07-07 DIAGNOSIS — I69319 Unspecified symptoms and signs involving cognitive functions following cerebral infarction: Secondary | ICD-10-CM

## 2014-07-07 DIAGNOSIS — I619 Nontraumatic intracerebral hemorrhage, unspecified: Secondary | ICD-10-CM | POA: Diagnosis not present

## 2014-07-07 DIAGNOSIS — R1310 Dysphagia, unspecified: Secondary | ICD-10-CM | POA: Diagnosis present

## 2014-07-07 DIAGNOSIS — I10 Essential (primary) hypertension: Secondary | ICD-10-CM | POA: Diagnosis present

## 2014-07-07 DIAGNOSIS — E860 Dehydration: Secondary | ICD-10-CM | POA: Diagnosis present

## 2014-07-07 DIAGNOSIS — I6911 Cognitive deficits following nontraumatic intracerebral hemorrhage: Secondary | ICD-10-CM

## 2014-07-07 DIAGNOSIS — H53461 Homonymous bilateral field defects, right side: Secondary | ICD-10-CM | POA: Diagnosis present

## 2014-07-07 DIAGNOSIS — I611 Nontraumatic intracerebral hemorrhage in hemisphere, cortical: Secondary | ICD-10-CM | POA: Diagnosis not present

## 2014-07-07 DIAGNOSIS — I6981 Cognitive deficits following other cerebrovascular disease: Secondary | ICD-10-CM

## 2014-07-07 DIAGNOSIS — I610 Nontraumatic intracerebral hemorrhage in hemisphere, subcortical: Secondary | ICD-10-CM | POA: Diagnosis not present

## 2014-07-07 DIAGNOSIS — R208 Other disturbances of skin sensation: Secondary | ICD-10-CM | POA: Diagnosis not present

## 2014-07-07 DIAGNOSIS — F17210 Nicotine dependence, cigarettes, uncomplicated: Secondary | ICD-10-CM | POA: Diagnosis present

## 2014-07-07 DIAGNOSIS — I69151 Hemiplegia and hemiparesis following nontraumatic intracerebral hemorrhage affecting right dominant side: Principal | ICD-10-CM

## 2014-07-07 DIAGNOSIS — R5383 Other fatigue: Secondary | ICD-10-CM

## 2014-07-07 DIAGNOSIS — G8190 Hemiplegia, unspecified affecting unspecified side: Secondary | ICD-10-CM | POA: Diagnosis not present

## 2014-07-07 DIAGNOSIS — K5900 Constipation, unspecified: Secondary | ICD-10-CM | POA: Diagnosis present

## 2014-07-07 DIAGNOSIS — G8191 Hemiplegia, unspecified affecting right dominant side: Secondary | ICD-10-CM | POA: Diagnosis not present

## 2014-07-07 DIAGNOSIS — I69898 Other sequelae of other cerebrovascular disease: Secondary | ICD-10-CM | POA: Diagnosis not present

## 2014-07-07 DIAGNOSIS — E876 Hypokalemia: Secondary | ICD-10-CM | POA: Diagnosis present

## 2014-07-07 DIAGNOSIS — G919 Hydrocephalus, unspecified: Secondary | ICD-10-CM | POA: Diagnosis present

## 2014-07-07 DIAGNOSIS — N390 Urinary tract infection, site not specified: Secondary | ICD-10-CM | POA: Diagnosis not present

## 2014-07-07 DIAGNOSIS — J9601 Acute respiratory failure with hypoxia: Secondary | ICD-10-CM | POA: Diagnosis present

## 2014-07-07 DIAGNOSIS — G9341 Metabolic encephalopathy: Secondary | ICD-10-CM | POA: Diagnosis present

## 2014-07-07 DIAGNOSIS — R209 Unspecified disturbances of skin sensation: Secondary | ICD-10-CM

## 2014-07-07 DIAGNOSIS — I69398 Other sequelae of cerebral infarction: Secondary | ICD-10-CM

## 2014-07-07 LAB — BASIC METABOLIC PANEL
Anion gap: 8 (ref 5–15)
BUN: 21 mg/dL — AB (ref 6–20)
CHLORIDE: 103 mmol/L (ref 101–111)
CO2: 27 mmol/L (ref 22–32)
Calcium: 9.8 mg/dL (ref 8.9–10.3)
Creatinine, Ser: 1.16 mg/dL (ref 0.61–1.24)
GFR calc Af Amer: >60 mL/min (ref >60)
Glucose, Bld: 118 mg/dL — ABNORMAL HIGH (ref 65–99)
POTASSIUM: 3.4 mmol/L — AB (ref 3.5–5.1)
Sodium: 138 mmol/L (ref 135–145)

## 2014-07-07 LAB — CBC
HCT: 40.7 % (ref 39.0–52.0)
Hemoglobin: 13.8 g/dL (ref 13.0–17.0)
MCH: 30.7 pg (ref 26.0–34.0)
MCHC: 33.9 g/dL (ref 30.0–36.0)
MCV: 90.4 fL (ref 78.0–100.0)
PLATELETS: 187 10*3/uL (ref 150–400)
RBC: 4.5 MIL/uL (ref 4.22–5.81)
RDW: 14 % (ref 11.5–15.5)
WBC: 9.4 10*3/uL (ref 4.0–10.5)

## 2014-07-07 LAB — BLOOD GAS, ARTERIAL
Acid-Base Excess: 3.3 mmol/L — ABNORMAL HIGH (ref 0.0–2.0)
Bicarbonate: 27.1 mEq/L — ABNORMAL HIGH (ref 20.0–24.0)
DRAWN BY: 398991
O2 CONTENT: 2 L/min
O2 Saturation: 98.4 %
Patient temperature: 98
TCO2: 28.3 mmol/L (ref 0–100)
pCO2 arterial: 38.9 mmHg (ref 35.0–45.0)
pH, Arterial: 7.455 — ABNORMAL HIGH (ref 7.350–7.450)
pO2, Arterial: 123 mmHg — ABNORMAL HIGH (ref 80.0–100.0)

## 2014-07-07 LAB — LACTIC ACID, PLASMA: LACTIC ACID, VENOUS: 1.2 mmol/L (ref 0.5–2.0)

## 2014-07-07 MED ORDER — HEPARIN SODIUM (PORCINE) 5000 UNIT/ML IJ SOLN
5000.0000 [IU] | Freq: Three times a day (TID) | INTRAMUSCULAR | Status: DC
  Administered 2014-07-07 – 2014-08-01 (×74): 5000 [IU] via SUBCUTANEOUS
  Filled 2014-07-07 (×81): qty 1

## 2014-07-07 MED ORDER — SODIUM CHLORIDE 0.45 % IV SOLN
INTRAVENOUS | Status: DC
  Administered 2014-07-07: 1000 mL via INTRAVENOUS
  Administered 2014-07-08 – 2014-07-16 (×9): via INTRAVENOUS
  Administered 2014-07-17: 1000 mL via INTRAVENOUS
  Administered 2014-07-18: 19:00:00 via INTRAVENOUS

## 2014-07-07 MED ORDER — AMLODIPINE BESYLATE 5 MG PO TABS
5.0000 mg | ORAL_TABLET | Freq: Every day | ORAL | Status: DC
  Administered 2014-07-08 – 2014-07-17 (×10): 5 mg via ORAL
  Filled 2014-07-07 (×11): qty 1

## 2014-07-07 MED ORDER — ONDANSETRON HCL 4 MG PO TABS: 4.0000 mg | ORAL_TABLET | Freq: Four times a day (QID) | ORAL | Status: DC | PRN

## 2014-07-07 MED ORDER — SENNOSIDES-DOCUSATE SODIUM 8.6-50 MG PO TABS
1.0000 | ORAL_TABLET | Freq: Two times a day (BID) | ORAL | Status: DC
  Administered 2014-07-07 – 2014-08-01 (×49): 1 via ORAL
  Filled 2014-07-07 (×43): qty 1

## 2014-07-07 MED ORDER — HEPARIN SODIUM (PORCINE) 5000 UNIT/ML IJ SOLN: 5000.0000 [IU] | Freq: Three times a day (TID) | INTRAMUSCULAR | Status: DC

## 2014-07-07 MED ORDER — RESOURCE THICKENUP CLEAR PO POWD
ORAL | Status: DC | PRN
  Filled 2014-07-07 (×2): qty 125

## 2014-07-07 MED ORDER — KCL IN DEXTROSE-NACL 20-5-0.9 MEQ/L-%-% IV SOLN
INTRAVENOUS | Status: DC
  Administered 2014-07-07: 13:00:00 via INTRAVENOUS
  Filled 2014-07-07 (×3): qty 1000

## 2014-07-07 MED ORDER — SORBITOL 70 % SOLN
30.0000 mL | Freq: Every day | Status: DC | PRN
  Administered 2014-07-22: 30 mL via ORAL
  Filled 2014-07-07: qty 30

## 2014-07-07 MED ORDER — ONDANSETRON HCL 4 MG/2ML IJ SOLN: 4.0000 mg | Freq: Four times a day (QID) | INTRAMUSCULAR | Status: DC | PRN

## 2014-07-07 MED ORDER — LISINOPRIL 20 MG PO TABS: 20.0000 mg | ORAL_TABLET | Freq: Two times a day (BID) | ORAL | Status: DC

## 2014-07-07 MED ORDER — ACETAMINOPHEN 325 MG PO TABS
650.0000 mg | ORAL_TABLET | ORAL | Status: DC | PRN
Start: 2014-07-07 — End: 2014-08-01
  Administered 2014-07-07 – 2014-07-28 (×10): 650 mg via ORAL
  Filled 2014-07-07 (×10): qty 2

## 2014-07-07 MED ORDER — AMLODIPINE BESYLATE 5 MG PO TABS
5.0000 mg | ORAL_TABLET | Freq: Every day | ORAL | Status: DC
  Administered 2014-07-07: 5 mg via ORAL
  Filled 2014-07-07: qty 1

## 2014-07-07 MED ORDER — LISINOPRIL 20 MG PO TABS
20.0000 mg | ORAL_TABLET | Freq: Two times a day (BID) | ORAL | Status: DC
  Administered 2014-07-07 – 2014-07-22 (×29): 20 mg via ORAL
  Filled 2014-07-07 (×32): qty 1

## 2014-07-07 MED ORDER — SODIUM CHLORIDE 0.9 % IV SOLN: INTRAVENOUS | Status: DC

## 2014-07-07 MED ORDER — PANTOPRAZOLE SODIUM 40 MG PO TBEC
40.0000 mg | DELAYED_RELEASE_TABLET | Freq: Every day | ORAL | Status: DC
  Administered 2014-07-08: 40 mg via ORAL
  Filled 2014-07-07: qty 1

## 2014-07-07 NOTE — PMR Pre-admission (Signed)
PMR Admission Coordinator Pre-Admission Assessment  Patient: Michael Goodwin is an 69 y.o., male MRN: [id number] DOB: 04-May-1945 Height: 6' (182.9 cm) Weight: 64.774 kg (142 lb 12.8 oz)              Insurance Information HMO:     PPO:      PCP:      IPA:      80/20: yes     OTHER: no HMO PRIMARY: Medicare a and b      Policy#: [id number]      Subscriber: pt Benefits:  Phone #: online palmetto 07/07/14      Eff. Date: 07/11/2009     Deduct: $1288      Out of Pocket Max: none      Life Max: none CIR: 100%      SNF: 20 full days Outpatient: 80%     Co-Pay: 20% Home Health: 100%      Co-Pay: none DME: 80%     Co-Pay: 20% Providers: pt choice  SECONDARY: none       Medicaid Application Date:       Case Manager: Michael Goodwin, dtr, instructed to go to [employer] to apply for Medicaid. 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 right handed male with history of hypertension on no prescription medications. Patient lives alone independent prior to admission.   Presented 07/04/2014 with right-sided weakness and altered mental status. CT the head showed left ICH with intraventricular extension. Blood pressure 173/103. Carotid Doppler showed no ICA stenosis. Echocardiogram with ejection fraction of 60% no wall motion abnormalities. Follow-up cranial CT scan showed evolving left thalamic 3.3 x 2.3 cm hematoma with 4 mm left-to-right midline shift mild hydrocephalus. Placed on intravenous labetalol. On 07/07/2014 question somnolence altered mental status with again cranial CT scan  completed showing no significant change. Urinalysis and ABGs ordered and O2 at 2 liters nasal cannula applied and sats up to 97%. ABG unrevealing except for mild metabolic alkalosis.    Total: 17 NIH    Past Medical History  History reviewed. No pertinent past medical history.  Family History  family history is not on file.  Prior Rehab/Hospitalizations:  Has the patient had major surgery during 100 days prior to admission? No  Current Medications   Current facility-administered medications:  .  acetaminophen (TYLENOL) tablet 650 mg, 650 mg, Oral, Q4H PRN **OR** [DISCONTINUED] acetaminophen (TYLENOL) suppository 650 mg, 650 mg, Rectal, Q4H PRN, Thana Farr, MD .  amLODipine (NORVASC) tablet 5 mg, 5 mg, Oral, Daily, Marvel Plan, MD .  antiseptic oral rinse (CPC / CETYLPYRIDINIUM CHLORIDE 0.05%) solution 7 mL, 7 mL, Mouth Rinse, q12n4p, Micki Riley, MD, 7 mL at 07/07/14 1200 .  chlorhexidine (PERIDEX) 0.12 % solution 15 mL, 15 mL, Mouth Rinse, BID, Micki Riley, MD, 15 mL at 07/06/14 2014 .  dextrose 5 % and 0.9 % NaCl with KCl 20 mEq/L infusion, , Intravenous, Continuous, Russella Dar, NP, Last Rate: 75 mL/hr at 07/07/14 1239 .  heparin injection 5,000 Units, 5,000 Units, Subcutaneous, 3 times per day,  Marvel Plan, MD, 5,000 Units at 07/07/14 [id number] .  labetalol (NORMODYNE,TRANDATE) injection 10-40 mg, 10-40 mg, Intravenous, Q2H PRN, Marvel Plan, MD .  lisinopril (PRINIVIL,ZESTRIL) tablet 20 mg, 20 mg, Oral, BID, Marvel Plan, MD .  pantoprazole (PROTONIX) EC tablet 40 mg, 40 mg, Oral, Daily, Marvel Plan, MD, 40 mg at 07/06/14 0950 .  RESOURCE THICKENUP CLEAR, , Oral, PRN, Marvel Plan, MD .  senna-docusate (Senokot-S) tablet 1 tablet, 1 tablet, Oral, BID, Thana Farr, MD, 1 tablet at 07/06/14 2140  Patients Current Diet: D 3 diet with thin liquids. Meds whole in puree  Precautions / Restrictions Precautions Precautions: Fall Restrictions Weight Bearing Restrictions: No    Has the patient had 2 or more falls or a fall with injury in the past year?No  Prior Activity Level Community (5-7x/wk): independent; not driving/no license. Cooks, cleans, No AD  Home Assistive Devices / Equipment Home Assistive Devices/Equipment: None  Prior Device Use: Indicate devices/aids used by the patient prior to current illness, exacerbation or injury? None of the above  Prior Functional Level Prior Function Level of Independence: Independent Comments: dropeed out of school at 7th grade. unable to read so no license in recent years  Self Care: Did the patient need help bathing, dressing, using the toilet or eating?  Independent  Indoor Mobility: Did the patient need assistance with walking from room to room (with or without device)? Independent  Stairs: Did the patient need assistance with internal or external stairs (with or without device)? Independent  Functional Cognition: Did the patient need help planning regular tasks such as shopping or remembering to take medications? Needed some help. Does not drive. Dtr got groceries. Pt cleaned home. Dtr cooked for pt and brought food also to him  Current Functional Level Cognition  Arousal/Alertness: Awake/alert Overall Cognitive Status: Impaired/Different from baseline Current Attention Level: Focused Orientation Level: Oriented to person, Oriented to place, Disoriented to time, Disoriented to situation Following Commands: Follows one step commands inconsistently Safety/Judgement: Decreased awareness of safety, Decreased awareness of deficits Memory: Appears intact Awareness: Impaired Awareness Impairment: Intellectual impairment Problem Solving: Impaired Problem Solving Impairment: Verbal complex Safety/Judgment:  (TBD)    Extremity Assessment (includes Sensation/Coordination)  Upper Extremity Assessment: Defer to OT evaluation RUE Deficits / Details: R inattention. Pt with spontaneous movement RUE during  functional tasks - greater movement proximally. Trying to use RUE to ash face. Unable to grasp and hold cloth without hand over hand assist. gross movement patterns in flexio RUE Sensation:  (appesr diminished) RUE Coordination:  (unable to use as gross assist. unable to grasp/release objec)  Lower Extremity Assessment: RLE deficits/detail RLE Deficits / Details: R inattension,  Some voluntary movement of quads to quick out leg while dangling EOB. minor movement of toes, associated trace of hip flexion. No ham movement noted. RLE Sensation:  (appears diminished, but difficult to assess) RLE Coordination: decreased fine motor, decreased gross motor    ADLs  Overall ADL's : Needs assistance/impaired Eating/Feeding: Moderate assistance Eating/Feeding Details (indicate cue type and reason): modified diet. full S Grooming: Moderate assistance Grooming Details (indicate cue type and reason): able to bring cloth to face; cues to attend and complete task Upper Body Bathing: Maximal assistance, Sitting Lower Body Bathing: Maximal assistance, Sit to/from stand Upper Body Dressing : Total assistance Lower Body Dressing: Total assistance Toileting- Clothing Manipulation and Hygiene: Total assistance Functional mobility during ADLs: +2 for physical assistance, Moderate assistance (sit - stand only) General ADL Comments: Pt lethargic during eval requiring vc to increase  level of arousal throughout session. Fatigued at end of session. Apparent perseveration during ADL tasks    Mobility  Overal bed mobility: +2 for physical assistance, Needs Assistance Bed Mobility: Supine to Sit, Sit to Supine Supine to sit: Max assist, +2 for physical assistance Sit to supine: Max assist, +2 for physical assistance General bed mobility comments: Pt attempting to get OOB moving L side. Unable to problem solve to assist with moving R side.     Transfers  Overall transfer level: Needs assistance Equipment used: 2  person hand held assist Transfers: Sit to/from Stand Sit to Stand: +2 physical assistance, Mod assist General transfer comment: Pt able to facilitate forward weight shift for sit to stand. Noted motor impersistence RLE in standing. tactile cues to maintain upright midline postural control and for L w/shift. L bias. Looking L in standing.     Ambulation / Gait / Stairs / Wheelchair Mobility  Ambulation/Gait General Gait Details: not tested    Posture / Balance Dynamic Sitting Balance Sitting balance - Comments: R bias, pushing behaviors, but easily inhibited. Balance Overall balance assessment: Needs assistance Sitting-balance support: Single extremity supported, Feet supported Sitting balance-Leahy Scale: Poor Sitting balance - Comments: R bias, pushing behaviors, but easily inhibited. 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 Dtr states unable to read. IVF at 75 cc/hr due to dehydration likely and had been on nectar thick liquids   Previous Home Environment Living Arrangements: Alone  Lives With: Alone Available Help at Discharge: Family, Available 24 hours/day, Other (Comment) (son and dtr can arrange 24/7 supervsion ) Type of Home: House Home Layout: One level Home Access: Stairs to enter Entrance Stairs-Rails: None Entrance Stairs-Number of Steps: 4 steps in front; ramp in back of home Bathroom Shower/Tub: Tub/shower unit, Engineer, building servicesCurtain Bathroom Toilet: Standard Bathroom Accessibility: Yes How Accessible: Accessible via walker Home Care Services: No Additional Comments: info from dtr, Michael Goodwin  Discharge Living Setting Plans for Discharge Living Setting: Patient's home, Alone Type of Home at Discharge:  House Discharge Home Layout: One level Discharge Home Access: Stairs to enter Entrance Stairs-Rails: None Entrance Stairs-Number of Steps: 4 steps in front, ramp at back entrance Discharge Bathroom Shower/Tub: Tub/shower unit, Curtain Discharge Bathroom Toilet: Standard Discharge Bathroom Accessibility: Yes How Accessible: Accessible via walker Does the patient have any problems obtaining your medications?: No  Social/Family/Support Systems Patient Roles: Partner, Parent Contact Information: Psychiatric nurseKatie, daughter Anticipated Caregiver: daughter and son Anticipated Caregiver's Contact Information: see above Ability/Limitations of Caregiver: datr and son work, but they can coordinate work schedules Caregiver Availability: 24/7 Discharge Plan Discussed with Primary Caregiver: Yes Is Caregiver In Agreement with Plan?: Yes Does Caregiver/Family have Issues with Lodging/Transportation while Pt is in Rehab?: No   Goals/Additional Needs Patient/Family Goal for Rehab: supervision with PT, OT, and SLP Expected length of stay: [facility] 18-21 days Special Service Needs: I have spoken with pt and his dtr concerning need for finger nails to be trimmed Pt/Family Agrees to Admission and willing to participate: Yes Program Orientation Provided & Reviewed with Pt/Caregiver Including Roles  & Responsibilities: Yes   Decrease burden of Care through IP rehab admission: n/a   Possible need for SNF placement upon discharge:not anticipated   Patient Condition: This patient's condition remains as documented in the consult  dated 08/06/2014, in which the Rehabilitation Physician determined and documented that the patient's condition is appropriate for intensive rehabilitative care in an inpatient rehabilitation facility. Will admit to inpatient rehab today.  Preadmission Screen Completed By:  Clois Dupes, 07/07/2014 1:04 PM ______________________________________________________________________    Discussed status with Dr. Wynn Banker on 07/07/14 at  1304 and received telephone approval for admission today.  Admission Coordinator:  Clois Dupes, time 6962 Date 07/07/2014.

## 2014-07-07 NOTE — Progress Notes (Signed)
Discharge orders received, pt for discharge to CIR today.  Family at bedside to assist pt with discharge. Staff brought pt to rehab via wheelchair.

## 2014-07-07 NOTE — Progress Notes (Addendum)
Upon initial assessment, pt is very minimally responsive to sternal rub, not following commands, R pupil 2, non-reactive. Per previous documentation, he was oriented to self and following commands with equally reactive pupils. Per NT, he had a sip of juice and bite of breakfast and voiced that he "didn't want any more", but no other interaction.  NP notified of change in LOC.  Pt placed on 2 L via [place], as his O2 was 93%.

## 2014-07-07 NOTE — Consult Note (Signed)
Michael Goodwin Consultation Note                                                                                    Michael Goodwin, is a 69 y.o. male  MRN: [id number]   DOB - February 24, 1945  Admit Date - 07/04/2014  Outpatient Primary MD for the patient is No PCP Per Patient  Referring MD: Marvel Plan / Neurology  Reason for Consultation: Medical management  With History of -  Apparent HTN but was not taking medicines  History reviewed in EPIC (unobtainable from pt). No pertinent past surgical history.  in for   Chief Complaint  Patient presents with  . Code Stroke     HPI This is a 69 yo male admitted 5/24 after being found down with focal right side weakness and confusion. CT Head in ER revealed left ICH w/ IV extension. Since admission he has had extensive eval: noted w/ mild dysphagia and persistent RUE hemiplegia. Rec was for CIR but awaiting clarification of bed availability  On the morning of initial consult (today) pt noted to be very lethargic and minimally repsonsive- O2 sats 85% on RA so [place] oxygen applied with sats up to 97%. About 30 minutes after O2 applied mentation improved; no new focal neuro deficits seen. ABG unrevealing except for mild metabolic alkalosis. Review of labs: admit UA abnormal and c/w DH and possible UA.   Review of Systems   In addition to the HPI above,  Unable to obtain form pt but according to documentation pt alert 5/26  *A full 10 point Review of Systems was done, except as stated above, all other Review of Systems were negative.  Social History History  Substance Use Topics  . Smoking status: Current Every Day Smoker -- 1.00 packs/day  . Smokeless tobacco: Not on file  . Alcohol Use: Yes     Comment: Occasional    Resides at: private residence  Lives with: alone  Ambulatory status: without assistive devices PTA but now requiring 2+ assist just for bed and chair mobility- gait not yet tested   Family History Due to mentation  unable to obtain from pt   Prior to Admission medications   Not on File    No Known Allergies  Physical Exam  Vitals  Blood pressure 150/85, pulse 56, temperature 98 F (36.7 C), temperature source Axillary, resp. rate 18, height 6' (1.829 m), weight 142 lb 12.8 oz (64.774 kg), SpO2 100 %.   General:  In moderate acute distress as evidenced by new lethargy  Psych: Lethargic so exam limited  Neuro: CN II through XII intact, spontaneous movement x 3 except for RUE; strength est 4/5 x3 except for RUE 0/5  ENT:  Ears and Eyes appear Normal, Conjunctivae clear, PER. Dry oral mucosa without erythema or exudates.  Neck:  Supple, No lymphadenopathy appreciated  Respiratory:  Symmetrical chest wall movement, Good air movement bilaterally, CTAB.nasal canula 2l/min  Cardiac:  RRR, No Murmurs, no LE edema noted, no JVD, No carotid bruits, peripheral pulses palpable at 2+  Abdomen:  Positive bowel sounds, Soft, Non tender, Non distended,  No masses appreciated, no obvious hepatosplenomegaly  Genitourinary:  Condom cath- dark amber urine: about 50 cc in bag and 150 cc on bladder scan  Skin:  No Cyanosis, Normal Skin Turgor, No Skin Rash or Bruise.  Extremities: Symmetrical without obvious trauma or injury,  no effusions.  Data Review  CBC  Recent Labs Lab 07/04/14 1831 07/04/14 1835 07/05/14 0855 07/06/14 0220 07/07/14 0430  WBC 11.0*  --  9.8 9.0 9.4  HGB 13.6 15.3 13.6 16.9 13.8  HCT 40.0 45.0 40.1 46.9 40.7  PLT 156  --  172 133* 187  MCV 87.3  --  87.7 88.0 90.4  MCH 29.7  --  29.8 31.7 30.7  MCHC 34.0  --  33.9 36.0 33.9  RDW 14.0  --  14.1 14.1 14.0  LYMPHSABS 1.0  --   --   --   --   MONOABS 0.7  --   --   --   --   EOSABS 0.0  --   --   --   --   BASOSABS 0.0  --   --   --   --     Chemistries   Recent Labs Lab 07/04/14 1831 07/04/14 1835 07/05/14 0855 07/06/14 0220 07/07/14 0430  NA 137 137 139 137 138  K 3.4* 3.4* 3.7 4.0 3.4*  CL 100* 100* 102  102 103  CO2 22  --  GLUCOSE 158* 161* 105* 99 118*  BUN 21* 21*  CREATININE 1.39* 1.30* 1.29* 1.14 1.16  CALCIUM 9.9  --  10.0 9.6 9.8  AST 45*  --   --   --   --   ALT 15*  --   --   --   --   ALKPHOS 60  --   --   --   --   BILITOT 0.9  --   --   --   --     estimated creatinine clearance is 55.1 mL/min (by C-G formula based on Cr of 1.16).   Recent Labs  07/05/14 0855  TSH 1.554    Coagulation profile  Recent Labs Lab 07/04/14 1831  INR 1.24    No results for input(s): DDIMER in the last 72 hours.  Cardiac Enzymes No results for input(s): CKMB, TROPONINI, MYOGLOBIN in the last 168 hours.  Invalid input(s): CK  Invalid input(s): POCBNP  Urinalysis    Component Value Date/Time   COLORURINE ORANGE* 07/04/2014 2057   APPEARANCEUR CLOUDY* 07/04/2014 2057   LABSPEC 1.028 07/04/2014 2057   PHURINE 5.5 07/04/2014 2057   GLUCOSEU 100* 07/04/2014 2057   HGBUR LARGE* 07/04/2014 2057   BILIRUBINUR SMALL* 07/04/2014 2057   KETONESUR 15* 07/04/2014 2057   PROTEINUR 100* 07/04/2014 2057   UROBILINOGEN 1.0 07/04/2014 2057   NITRITE NEGATIVE 07/04/2014 2057   LEUKOCYTESUR SMALL* 07/04/2014 2057    Imaging results:   Ct Head Wo Contrast  07/05/2014   CLINICAL DATA:  Re-evaluate intracranial hemorrhage  EXAM: CT HEAD WITHOUT CONTRAST  TECHNIQUE: Contiguous axial images were obtained from the base of the skull through the vertex without intravenous contrast.  COMPARISON:  CT head Jul 04, 2014  FINDINGS: Evolving 3.3 x 2.3 cm LEFT thalamus/corona radiata hematoma with intraventricular extension, small amount of layering blood products in the occipital horns. Blood products extend to the anterior recess of third ventral, LEFT lateral ventricle with mild, similar mild suspected hydrocephalus. Local mass effect, 4 mm LEFT-to-RIGHT midline shift.  Patchy to confluent supratentorial white matter hypodensities again noted. No  acute large vascular territory  infarct. Tortuous calcified anterior cerebral artery. No acute large vascular territory infarct.  Basal cistern patent. No abnormal extra-axial fluid collections. Mild calcific atherosclerosis the carotid siphons.  Mild paranasal sinus mucosal thickening without air-fluid levels. The mastoid air cells are well aerated. No skull fracture. Ocular globes and orbital contents are unremarkable peer  IMPRESSION: Evolving LEFT thalamic 3.3 x 2.3 cm hematoma with intraventricular extension. 4 mm LEFT-to-RIGHT midline shift. Mild hydrocephalus suspected.  Moderate white matter changes suggest chronic small vessel ischemic disease.   Electronically Signed   By: Awilda Metro M.D.   On: 07/05/2014 06:20   Ct Head Wo Contrast  07/04/2014   CLINICAL DATA:  Code stroke.  Found down at nursing home.  EXAM: CT HEAD WITHOUT CONTRAST  TECHNIQUE: Contiguous axial images were obtained from the base of the skull through the vertex without intravenous contrast.  COMPARISON:  None.  FINDINGS: There is an at least 3.2 x 2.5 cm hemorrhage is located within the left thalamus (image 19, series 4) with resultant mass effect and approximately 6 mm of left-to-right midline shift. There is extension of this early intraparenchymal hemorrhage well the adjacent left lateral ventricular system (representative images 20 and 21, series 4 with trace amount of blood seen within the third ventricle (image 17, series 4).  Bilateral basal ganglial calcifications. Scattered periventricular hypodensities compatible microvascular ischemic disease. No CT evidence of acute large territory infarct. Punctate (approximately 3 mm) calcification about the anterior medial aspect of the left frontal lobe (image 18, series 4) is nonspecific with differential considerations including the sequela of prior infection or a vascular calcification. No definite intraparenchymal or extra-axial mass.  Scattered opacification of the right posterior ethmoidal air cells. The  remaining paranasal sinuses and mastoid air cells are normally aerated. No air-fluid levels.  Regional soft tissues appear normal. No displaced calvarial fracture.  IMPRESSION: Acute approximately 3.2 cm presumed hypertensive intraparenchymal left thalamic hemorrhage with intraventricular extension and a minimal (approximately 6 mm) of left-to-right midline shift.  Critical Value/emergent results were called by telephone at the time of interpretation on 07/04/2014 at 7:02 pm to Dr. Hosie Poisson, who verbally acknowledged these results.   Electronically Signed   By: Simonne Come M.D.   On: 07/04/2014 19:07     EKG: (Independently reviewed) Sinus rhythm, voltage criteria c/w LVH, no ischemic changes   Assessment & Plan  Principal Problem:   Nontraumatic thalamic hemorrhage/ IVH /right hemiplegia and cognitive deficits -per primary Neurology team  Active Problems:   Metabolic encephalopathy -etiology unclear but sx's have improved after application of oxygen -meds reviewed and pt had not received any sedating meds past 24 hrs -given recent ICH will check STAT CT Head to exclude add'l bleeding -also had abn UA at admit so could have UTI-rpt UA/cx but since no fever or leukocytosis no empiric anbx's -NPO until fully alert- OK to have meds w/sips (RN at bedside to monitor)    Acute respiratory failure with hypoxia -unclear what precipitated hypoxia (aspiration vs hypoventilation) -ABG w/o hypercarbia -PCXR pending STAT -cont supportive care with oxygen     Dehydration -begin IVFs-appeared dry preadmit -very little urine in bladder -follow labs -ck lactic acid re: perfusion    Essential hypertension -cont ACE I   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 ANP on 07/07/2014 at 9:09 AM  Between 7am to 7pm - Pager - [phone]  After 7pm go to [email or web address] - password [email or web address]  And look for the night coverage person covering me after hours  [facility]

## 2014-07-07 NOTE — Progress Notes (Signed)
Nurse referred PT. Chaplain visit PT who  was restless. His daughterFlorentina Addison( Katie) was with him. Chaplain talked with daughter and prayed with her for Pt and family. Will follow up regularly.    07/07/14 1000  Clinical Encounter Type  Visited With Patient;Family  Visit Type Initial;Spiritual support  Referral From Nurse  Spiritual Encounters  Spiritual Needs Prayer;Emotional

## 2014-07-07 NOTE — H&P (Signed)
Physical Medicine and Rehabilitation Admission H&P   Chief Complaint  Patient presents with  . Code Stroke  : HPI: Michael Goodwin is a 69 y.o. right handed male with history of tobacco abuse, hypertension on no prescription medications. Patient lives alone independent prior to admission. Presented 07/04/2014 with right-sided weakness and altered mental status. CT the head showed left ICH with intraventricular extension. Blood pressure 173/103. Carotid Doppler showed no ICA stenosis. Echocardiogram with ejection fraction of 60% no wall motion abnormalities. Follow-up cranial CT scan 07/05/2014 showed evolving left thalamic 3.3 x 2.3 cm hematoma with 4 mm left-to-right midline shift mild hydrocephalus. Placed on intravenous labetalol for blood pressure control. Subcutaneous heparin initiated for DVT prophylaxis 07/06/2014. On 07/07/2014 question somnolence altered mental status with again cranial CT scan completed showing no significant change in therapy is resumed. Mechanical soft thin liquid diet. Physical and occupational therapy evaluations completed 07/06/2014 with recommendations of physical medicine rehabilitation consult. Patient was admitted for comprehensive rehabilitation program  Patient is sleeping but awakens to voice, per nursing he has been active and pulled off his condom catheter and pulled out an IV today. By mouth intake is still poor  ROS Review of Systems  Gastrointestinal: Positive for constipation.  Musculoskeletal: Positive for myalgias.  Neurological: Positive for headaches.  All other systems reviewed and are negative    History reviewed. No pertinent past medical history. History reviewed. No pertinent past surgical history. History reviewed. No pertinent family history. Social History:  reports that he has been smoking. He does not have any smokeless tobacco history on file. He reports that he drinks alcohol. His drug history is not on file. Allergies: No  Known Allergies No prescriptions prior to admission    Home: Home Living Family/patient expects to be discharged to:: Inpatient rehab Living Arrangements: Alone Available Help at Discharge: Other (Comment) (unsure) Type of Home: House Additional Comments: unable to get specific information about the house due to lethargy Lives With: Alone  Functional History: Prior Function Level of Independence: Independent  Functional Status:  Mobility: Bed Mobility Overal bed mobility: +2 for physical assistance, Needs Assistance Bed Mobility: Supine to Sit, Sit to Supine Supine to sit: Max assist, +2 for physical assistance Sit to supine: Max assist, +2 for physical assistance General bed mobility comments: Pt attempting to get OOB moving L side. Unable to problem solve to assist with moving R side.  Transfers Overall transfer level: Needs assistance Equipment used: 2 person hand held assist Transfers: Sit to/from Stand Sit to Stand: +2 physical assistance, Mod assist General transfer comment: Pt able to facilitate forward weight shift for sit to stand. Noted motor impersistence RLE in standing. tactile cues to maintain upright midline postural control and for L w/shift. L bias. Looking L in standing.  Ambulation/Gait General Gait Details: not tested    ADL: ADL Overall ADL's : Needs assistance/impaired Eating/Feeding: Moderate assistance Eating/Feeding Details (indicate cue type and reason): modified diet. full S Grooming: Moderate assistance Grooming Details (indicate cue type and reason): able to bring cloth to face; cues to attend and complete task Upper Body Bathing: Maximal assistance, Sitting Lower Body Bathing: Maximal assistance, Sit to/from stand Upper Body Dressing : Total assistance Lower Body Dressing: Total assistance Toileting- Clothing Manipulation and Hygiene: Total assistance Functional mobility during ADLs: +2 for physical assistance, Moderate assistance (sit  - stand only) General ADL Comments: Pt lethargic during eval requiring vc to increase level of arousal throughout session. Fatigued at end of session. Apparent perseveration during ADL tasks  Cognition: Cognition Overall Cognitive Status: Impaired/Different from baseline Arousal/Alertness: Awake/alert Orientation Level: Oriented to person Memory: Appears intact Awareness: Impaired Awareness Impairment: Intellectual impairment Problem Solving: Impaired Problem Solving Impairment: Verbal complex Safety/Judgment: (TBD) Cognition Arousal/Alertness: Lethargic Behavior During Therapy: Flat affect, Impulsive, Restless Overall Cognitive Status: Impaired/Different from baseline Area of Impairment: Orientation, Attention, Following commands, Safety/judgement, Awareness, Problem solving Orientation Level: Disoriented to, Place, Time, Situation Current Attention Level: Focused Following Commands: Follows one step commands inconsistently Safety/Judgement: Decreased awareness of safety, Decreased awareness of deficits Awareness: Intellectual Problem Solving: Slow processing, Decreased initiation, Difficulty sequencing, Requires verbal cues, Requires tactile cues  Physical Exam: Blood pressure 150/85, pulse 56, temperature 98 F (36.7 C), temperature source Axillary, resp. rate 18, height 6' (1.829 m), weight 64.774 kg (142 lb 12.8 oz), SpO2 100 %. Physical Exam HENT:  Mild right facial droop  Eyes:  Pupils reactive to light  Neck: Normal range of motion. Neck supple. No thyromegaly present.  Cardiovascular: Normal rate and regular rhythm.  Respiratory: Effort normal and breath sounds normal. No respiratory distress.  GI: Soft. Bowel sounds are normal. He exhibits no distension.  Neurological: He is alert.  Left eye gaze preference. Severe dysarthria but intelligible for most words. He was able to provide his name but needed cues for date of birth. He could not state his age. Followed  simple commands. Limited awareness of deficits  Skin: Skin is warm and dry.  Right central 7 Motor strength is trace in the low right biceps and triceps and grip and finger extensors Right lower extremity to minus hip flexion 3 minus knee extension 3 minus ankle dorsiflexion plantarflexion 4+/5 in left deltoid, biceps, triceps, grip, hip flexor, knee extensor, ankle dorsiflexor and plantar flexor Patient is difficult to assess for visual field secondary to decreased attention and lethargy. He does appear to have right neglect and possible right homonymous hemianopsia Withdraws to pinch on right side but Delayed compared to the left side    Lab Results Last 48 Hours    Results for orders placed or performed during the hospital encounter of 07/04/14 (from the past 48 hour(s))  CBC Status: None   Collection Time: 07/05/14 8:55 AM  Result Value Ref Range   WBC 9.8 4.0 - 10.5 K/uL   RBC 4.57 4.22 - 5.81 MIL/uL   Hemoglobin 13.6 13.0 - 17.0 g/dL   HCT 40.1 39.0 - 52.0 %   MCV 87.7 78.0 - 100.0 fL   MCH 29.8 26.0 - 34.0 pg   MCHC 33.9 30.0 - 36.0 g/dL   RDW 14.1 11.5 - 15.5 %   Platelets 172 150 - 400 K/uL  Basic metabolic panel Status: Abnormal   Collection Time: 07/05/14 8:55 AM  Result Value Ref Range   Sodium 139 135 - 145 mmol/L   Potassium 3.7 3.5 - 5.1 mmol/L   Chloride 102 101 - 111 mmol/L   CO2 26 22 - 32 mmol/L   Glucose, Bld 105 (H) 65 - 99 mg/dL   BUN 19 6 - 20 mg/dL   Creatinine, Ser 1.29 (H) 0.61 - 1.24 mg/dL   Calcium 10.0 8.9 - 10.3 mg/dL   GFR calc non Af Amer 55 (L) >60 mL/min   GFR calc Af Amer >60 >60 mL/min    Comment: (NOTE) The eGFR has been calculated using the CKD EPI equation. This calculation has not been validated in all clinical situations. eGFR's persistently <60 mL/min signify possible Chronic Kidney Disease.    Anion gap 11 5 - 15  Lipid  panel Status: None   Collection Time: 07/05/14 8:55 AM  Result Value Ref Range   Cholesterol 162 0 - 200 mg/dL   Triglycerides 62 <150 mg/dL   HDL 95 >40 mg/dL   Total CHOL/HDL Ratio 1.7 RATIO   VLDL 12 0 - 40 mg/dL   LDL Cholesterol 55 0 - 99 mg/dL    Comment:   Total Cholesterol/HDL:CHD Risk Coronary Heart Disease Risk Table  Men Women 1/2 Average Risk 3.4 3.3 Average Risk 5.0 4.4 2 X Average Risk 9.6 7.1 3 X Average Risk 23.4 11.0   Use the calculated Patient Ratio above and the CHD Risk Table to determine the patient's CHD Risk.   ATP III CLASSIFICATION (LDL): <100 mg/dL Optimal 100-129 mg/dL Near or Above  Optimal 130-159 mg/dL Borderline 160-189 mg/dL High >190 mg/dL Very High   Hemoglobin A1c Status: Abnormal   Collection Time: 07/05/14 8:55 AM  Result Value Ref Range   Hgb A1c MFr Bld 5.8 (H) 4.8 - 5.6 %    Comment: (NOTE)  Pre-diabetes: 5.7 - 6.4  Diabetes: >6.4  Glycemic control for adults with diabetes: <7.0    Mean Plasma Glucose 120 mg/dL    Comment: (NOTE) Performed At: [facility] [place], [place] [id number] Lindon Romp MD YK:[id number]   TSH Status: None   Collection Time: 07/05/14 8:55 AM  Result Value Ref Range   TSH 1.554 0.350 - 4.500 uIU/mL  CBC Status: Abnormal   Collection Time: 07/06/14 2:20 AM  Result Value Ref Range   WBC 9.0 4.0 - 10.5 K/uL    Comment: WHITE COUNT CONFIRMED ON SMEAR   RBC 5.33 4.22 - 5.81 MIL/uL   Hemoglobin 16.9 13.0 - 17.0 g/dL   HCT 46.9 39.0 - 52.0 %   MCV 88.0 78.0 - 100.0 fL   MCH 31.7 26.0 - 34.0 pg   MCHC 36.0 30.0 - 36.0 g/dL   RDW 14.1 11.5 - 15.5 %   Platelets 133 (L) 150 - 400 K/uL    Comment: PLATELET COUNT  CONFIRMED BY SMEAR  Basic metabolic panel Status: Abnormal   Collection Time: 07/06/14 2:20 AM  Result Value Ref Range   Sodium 137 135 - 145 mmol/L   Potassium 4.0 3.5 - 5.1 mmol/L   Chloride 102 101 - 111 mmol/L   CO2 23 22 - 32 mmol/L   Glucose, Bld 99 65 - 99 mg/dL   BUN 21 (H) 6 - 20 mg/dL   Creatinine, Ser 1.14 0.61 - 1.24 mg/dL   Calcium 9.6 8.9 - 10.3 mg/dL   GFR calc non Af Amer >60 >60 mL/min   GFR calc Af Amer >60 >60 mL/min    Comment: (NOTE) The eGFR has been calculated using the CKD EPI equation. This calculation has not been validated in all clinical situations. eGFR's persistently <60 mL/min signify possible Chronic Kidney Disease.    Anion gap 12 5 - 15  Basic metabolic panel Status: Abnormal   Collection Time: 07/07/14 4:30 AM  Result Value Ref Range   Sodium 138 135 - 145 mmol/L   Potassium 3.4 (L) 3.5 - 5.1 mmol/L   Chloride 103 101 - 111 mmol/L   CO2 27 22 - 32 mmol/L   Glucose, Bld 118 (H) 65 - 99 mg/dL   BUN 21 (H) 6 - 20 mg/dL   Creatinine, Ser 1.16 0.61 - 1.24 mg/dL   Calcium 9.8 8.9 - 10.3 mg/dL   GFR calc non Af Amer >60 >60  mL/min   GFR calc Af Amer >60 >60 mL/min    Comment: (NOTE) The eGFR has been calculated using the CKD EPI equation. This calculation has not been validated in all clinical situations. eGFR's persistently <60 mL/min signify possible Chronic Kidney Disease.    Anion gap 8 5 - 15      Imaging Results (Last 48 hours)    No results found.       Medical Problem List and Plan: 1. 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 DVT Prophylaxis/Anticoagulation: Subcutaneous heparin for DVT prophylaxis initiated 07/06/2014 3. Pain Management: Tylenol as needed 4. Dysphagia. Dysphagia 3 thin liquids. Monitor for any signs of aspiration. Follow-up  speech therapy.IVFs for hydration due to decreased nutritional storage and hydration 5. Neuropsych: This patient is capable of making decisions on his own behalf. 6. Skin/Wound Care: Routine skin checks 7. Fluids/Electrolytes/Nutrition: Routine I nose with follow-up chemistries 8. Hypertension. Lisinopril 20 mg daily. Monitor with increased mobility 9. Tobacco abuse, counseling    Post Admission Physician Evaluation: Functional deficits secondary to left thalamic intracranial hemorrhage secondary to hypertensive crisisWith right hemiparesis and cognitive deficits 1. Patient is admitted to receive collaborative, interdisciplinary care between the physiatrist, rehab nursing staff, and therapy team. 2. Patient's level of medical complexity and substantial therapy needs in context of that medical necessity cannot be provided at a lesser intensity of care such as a SNF. 3. Patient has experienced substantial functional loss from his/her baseline which was documented above under the "Functional History" and "Functional Status" headings. Judging by the patient's diagnosis, physical exam, and functional history, the patient has potential for functional progress which will result in measurable gains while on inpatient rehab. These gains will be of substantial and practical use upon discharge in facilitating mobility and self-care at the household level. 4. Physiatrist will provide 24 hour management of medical needs as well as oversight of the therapy plan/treatment and provide guidance as appropriate regarding the interaction of the two. 5. 24 hour rehab nursing will assist with bladder management, bowel management, safety, skin/wound care, disease management, medication administration, pain management and patient education and help integrate therapy concepts, techniques,education, etc. 6. PT will assess and treat for/with: pre gait, gait training, endurance , safety, equipment, neuromuscular re  education. Goals are: Sup/minA. 7. OT will assess and treat for/with: ADLs, Cognitive perceptual skills, Neuromuscular re education, safety, endurance, equipment. Goals are: Sup/MinA. Therapy may proceed with showering this patient. 8. SLP will assess and treat for/with: Memory, attention, concentration, attention toward the right side, medication management Goals are: Min assist medication management. 9. Case Management and Social Worker will assess and treat for psychological issues and discharge planning. 10. Team conference will be held weekly to assess progress toward goals and to determine barriers to discharge. 11. Patient will receive at least 3 hours of therapy per day at least 5 days per week. 12. ELOS: 18-21 days  13. Prognosis: fair     Charlett Blake M.D. [facility] FAAPM&R (Sports Med, Neuromuscular Med) Diplomate Am Board of Electrodiagnostic Med  07/07/2014

## 2014-07-07 NOTE — Progress Notes (Signed)
Pt escorted to room by nurse. Family and patient educated about rehab safety and rehab schedule. Patient told not to get out of bed without calling. Family stated they would not attempt to get him up. Materials handed out. Bed alarm set and patient sleeping comfortably.

## 2014-07-07 NOTE — Progress Notes (Signed)
I met with pt and his daughter, Michael Goodwin, at bedside. Pt drowsy, but easly arousable. Answers questions appropriately per daughter. Reports he smokes 1 to 2 packs per day. Daughter prefers pt to be admitted to inpt rehab and has discussed with her brother as well as pt's siblings. I discussed with Dr. Erlinda Hong . We will await CT scan results and admit to inpt rehab once cleared medically. Possibly today. I will f/u today. I will alert RN CM and SW. [phone]

## 2014-07-07 NOTE — Progress Notes (Signed)
I spoke with Dr. Roda ShuttersXu, pt medically ready to admit to inpt rehab today. I will make the arrangements. [phone]

## 2014-07-07 NOTE — Progress Notes (Signed)
Pt now following certain commands and is oriented to self, although still very hard to arouse

## 2014-07-07 NOTE — Progress Notes (Signed)
PMR Admission Coordinator Pre-Admission Assessment  Patient: Michael Goodwin is an 69 y.o., male MRN: [id number] DOB: 1945/07/30 Height: 6' (182.9 cm) Weight: 64.774 kg (142 lb 12.8 oz)  Insurance Information HMO: PPO: PCP: IPA: 80/20: yes OTHER: no HMO PRIMARY: Medicare a and b Policy#: [id number] Subscriber: pt Benefits: Phone #: online palmetto 07/07/14  Eff. Date: 07/11/2009 Deduct: $1288 Out of Pocket Max: none Life Max: none CIR: 100% SNF: 20 full days Outpatient: 80% Co-Pay: 20% Home Health: 100% Co-Pay: none DME: 80% Co-Pay: 20% Providers: pt choice  SECONDARY: none   Medicaid Application Date: Case Manager: Katie, dtr, instructed to go to [employer] to apply for Medicaid. 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 right handed male with history of hypertension on no prescription medications. Patient lives alone independent prior to admission.   Presented 07/04/2014 with right-sided weakness and altered mental status. CT the head showed left ICH with intraventricular extension. Blood pressure 173/103. Carotid Doppler showed no ICA stenosis. Echocardiogram with ejection fraction of 60% no wall motion abnormalities. Follow-up cranial CT scan showed evolving left thalamic 3.3 x 2.3 cm hematoma with 4 mm left-to-right  midline shift mild hydrocephalus. Placed on intravenous labetalol. On 07/07/2014 question somnolence altered mental status with again cranial CT scan completed showing no significant change. Urinalysis and ABGs ordered and O2 at 2 liters nasal cannula applied and sats up to 97%. ABG unrevealing except for mild metabolic alkalosis.   Total: 17 NIH    Past Medical History  History reviewed. No pertinent past medical history.  Family History  family history is not on file.  Prior Rehab/Hospitalizations:  Has the patient had major surgery during 100 days prior to admission? No  Current Medications   Current facility-administered medications:  . acetaminophen (TYLENOL) tablet 650 mg, 650 mg, Oral, Q4H PRN **OR** [DISCONTINUED] acetaminophen (TYLENOL) suppository 650 mg, 650 mg, Rectal, Q4H PRN, Thana Farr, MD . amLODipine (NORVASC) tablet 5 mg, 5 mg, Oral, Daily, Marvel Plan, MD . antiseptic oral rinse (CPC / CETYLPYRIDINIUM CHLORIDE 0.05%) solution 7 mL, 7 mL, Mouth Rinse, q12n4p, Micki Riley, MD, 7 mL at 07/07/14 1200 . chlorhexidine (PERIDEX) 0.12 % solution 15 mL, 15 mL, Mouth Rinse, BID, Micki Riley, MD, 15 mL at 07/06/14 2014 . dextrose 5 % and 0.9 % NaCl with KCl 20 mEq/L infusion, , Intravenous, Continuous, Russella Dar, NP, Last Rate: 75 mL/hr at 07/07/14 1239 . heparin injection 5,000 Units, 5,000 Units, Subcutaneous, 3 times per day, Marvel Plan, MD, 5,000 Units at 07/07/14 0545 . labetalol (NORMODYNE,TRANDATE) injection 10-40 mg, 10-40 mg, Intravenous, Q2H PRN, Marvel Plan, MD . lisinopril (PRINIVIL,ZESTRIL) tablet 20 mg, 20 mg, Oral, BID, Marvel Plan, MD . pantoprazole (PROTONIX) EC tablet 40 mg, 40 mg, Oral, Daily, Marvel Plan, MD, 40 mg at 07/06/14 0950 . RESOURCE THICKENUP CLEAR, , Oral, PRN, Marvel Plan, MD . senna-docusate (Senokot-S) tablet 1 tablet, 1 tablet, Oral, BID, Thana Farr, MD, 1 tablet at 07/06/14 2140  Patients Current Diet: D 3 diet  with thin liquids. Meds whole in puree  Precautions / Restrictions Precautions Precautions: Fall Restrictions  Weight Bearing Restrictions: No   Has the patient had 2 or more falls or a fall with injury in the past year?No  Prior Activity Level Community (5-7x/wk): independent; not driving/no license. Cooks, cleans, No AD  Home Assistive Devices / Equipment Home Assistive Devices/Equipment: None  Prior Device Use: Indicate devices/aids used by the patient prior to current illness, exacerbation or injury? None of the above  Prior Functional Level Prior Function Level of Independence: Independent Comments: dropeed out of school at 7th grade. unable to read so no license in recent years  Self Care: Did the patient need help bathing, dressing, using the toilet or eating? Independent  Indoor Mobility: Did the patient need assistance with walking from room to room (with or without device)? Independent  Stairs: Did the patient need assistance with internal or external stairs (with or without device)? Independent  Functional Cognition: Did the patient need help planning regular tasks such as shopping or remembering to take medications? Needed some help. Does not drive. Dtr got groceries. Pt cleaned home. Dtr cooked for pt and brought food also to him  Current Functional Level Cognition  Arousal/Alertness: Awake/alert Overall Cognitive Status: Impaired/Different from baseline Current Attention Level: Focused Orientation Level: Oriented to person, Oriented to place, Disoriented to time, Disoriented to situation Following Commands: Follows one step commands inconsistently Safety/Judgement: Decreased awareness of safety, Decreased awareness of deficits Memory: Appears intact Awareness: Impaired Awareness Impairment: Intellectual impairment Problem Solving: Impaired Problem Solving Impairment: Verbal complex Safety/Judgment: (TBD)   Extremity Assessment (includes  Sensation/Coordination)  Upper Extremity Assessment: Defer to OT evaluation RUE Deficits / Details: R inattention. Pt with spontaneous movement RUE during functional tasks - greater movement proximally. Trying to use RUE to ash face. Unable to grasp and hold cloth without hand over hand assist. gross movement patterns in flexio RUE Sensation: (appesr diminished) RUE Coordination: (unable to use as gross assist. unable to grasp/release objec)  Lower Extremity Assessment: RLE deficits/detail RLE Deficits / Details: R inattension, Some voluntary movement of quads to quick out leg while dangling EOB. minor movement of toes, associated trace of hip flexion. No ham movement noted. RLE Sensation: (appears diminished, but difficult to assess) RLE Coordination: decreased fine motor, decreased gross motor    ADLs  Overall ADL's : Needs assistance/impaired Eating/Feeding: Moderate assistance Eating/Feeding Details (indicate cue type and reason): modified diet. full S Grooming: Moderate assistance Grooming Details (indicate cue type and reason): able to bring cloth to face; cues to attend and complete task Upper Body Bathing: Maximal assistance, Sitting Lower Body Bathing: Maximal assistance, Sit to/from stand Upper Body Dressing : Total assistance Lower Body Dressing: Total assistance Toileting- Clothing Manipulation and Hygiene: Total assistance Functional mobility during ADLs: +2 for physical assistance, Moderate assistance (sit - stand only) General ADL Comments: Pt lethargic during eval requiring vc to increase level of arousal throughout session. Fatigued at end of session. Apparent perseveration during ADL tasks    Mobility  Overal bed mobility: +2 for physical assistance, Needs Assistance Bed Mobility: Supine to Sit, Sit to Supine Supine to sit: Max assist, +2 for physical assistance Sit to supine: Max assist, +2 for physical assistance General bed mobility comments: Pt  attempting to get OOB moving L side. Unable to problem solve to assist with moving R side.     Transfers  Overall transfer level: Needs assistance Equipment used: 2 person hand held assist Transfers: Sit to/from Stand Sit to Stand: +2 physical assistance, Mod assist General transfer comment: Pt able to facilitate forward weight  shift for sit to stand. Noted motor impersistence RLE in standing. tactile cues to maintain upright midline postural control and for L w/shift. L bias. Looking L in standing.     Ambulation / Gait / Stairs / Wheelchair Mobility  Ambulation/Gait General Gait Details: not tested    Posture / Balance Dynamic Sitting Balance Sitting balance - Comments: R bias, pushing behaviors, but easily inhibited. Balance Overall balance assessment: Needs assistance Sitting-balance support: Single extremity supported, Feet supported Sitting balance-Leahy Scale: Poor Sitting balance - Comments: R bias, pushing behaviors, but easily inhibited. Standing balance support: During functional activity, Bilateral upper extremity supported Standing balance-Leahy Scale: Zero    Special needs/care consideration Oxygen O2 at 2 liters [place] Bowel mgmt: NO BM documented Bladder mgmt: incontinent Pt does not have a PCP Pt finished 7th grade and worked in Set [employer]. Dtr states unable to read. IVF at 75 cc/hr due to dehydration likely and had been on nectar thick liquids   Previous Home Environment Living Arrangements: Alone Lives With: Alone Available Help at Discharge: Family, Available 24 hours/day, Other (Comment) (son and dtr can arrange 24/7 supervsion ) Type of Home: House Home Layout: One level Home Access: Stairs to enter Entrance Stairs-Rails: None Entrance Stairs-Number of Steps: 4 steps in front; ramp in back of home Bathroom Shower/Tub: Tub/shower unit, Engineer, building services: Standard Bathroom Accessibility: Yes How Accessible: Accessible via walker Home  Care Services: No Additional Comments: info from dtr, Katie  Discharge Living Setting Plans for Discharge Living Setting: Patient's home, Alone Type of Home at Discharge: House Discharge Home Layout: One level Discharge Home Access: Stairs to enter Entrance Stairs-Rails: None Entrance Stairs-Number of Steps: 4 steps in front, ramp at back entrance Discharge Bathroom Shower/Tub: Tub/shower unit, Curtain Discharge Bathroom Toilet: Standard Discharge Bathroom Accessibility: Yes How Accessible: Accessible via walker Does the patient have any problems obtaining your medications?: No  Social/Family/Support Systems Patient Roles: Partner, Parent Contact Information: Psychiatric nurse, daughter Anticipated Caregiver: daughter and son Anticipated Caregiver's Contact Information: see above Ability/Limitations of Caregiver: datr and son work, but they can coordinate work schedules Caregiver Availability: 24/7 Discharge Plan Discussed with Primary Caregiver: Yes Is Caregiver In Agreement with Plan?: Yes Does Caregiver/Family have Issues with Lodging/Transportation while Pt is in Rehab?: No   Goals/Additional Needs Patient/Family Goal for Rehab: supervision with PT, OT, and SLP Expected length of stay: [facility] 18-21 days Special Service Needs: I have spoken with pt and his dtr concerning need for finger nails to be trimmed Pt/Family Agrees to Admission and willing to participate: Yes Program Orientation Provided & Reviewed with Pt/Caregiver Including Roles & Responsibilities: Yes   Decrease burden of Care through IP rehab admission: n/a   Possible need for SNF placement upon discharge:not anticipated   Patient Condition: This patient's condition remains as documented in the consult dated 08/06/2014, in which the Rehabilitation Physician determined and documented that the patient's condition is appropriate for intensive rehabilitative care in an inpatient rehabilitation facility. Will admit to inpatient  rehab today.  Preadmission Screen Completed By: Clois Dupes, 07/07/2014 1:04 PM ______________________________________________________________________  Discussed status with Dr. Wynn Banker on 07/07/14 at 1304 and received telephone approval for admission today.  Admission Coordinator: Clois Dupes, time 9604 Date 07/07/2014.          Cosigned by: Erick Colace, MD at 07/07/2014 1:06 PM  Revision History     Date/Time User Provider Type Action   07/07/2014 1:06 PM Erick Colace, MD Physician Cosign   07/07/2014 1:04 PM Britta Mccreedy  Kalman DrapeG Thamar Holik, RN Rehab Admission Coordinator Sign

## 2014-07-07 NOTE — Progress Notes (Signed)
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 Roda Shutters   HPI: Michael Goodwin is a 69 y.o. right handed male with history of hypertension on no prescription medications. Patient lives alone independent prior to admission. Presented 07/04/2014 with right-sided weakness and altered mental status. CT the head showed left ICH with intraventricular extension. Blood pressure 173/103. Carotid Doppler showed no ICA stenosis. Echocardiogram with ejection fraction of 60% no wall motion abnormalities. Follow-up cranial CT scan showed evolving left thalamic 3.3 x 2.3 cm hematoma with 4 mm left-to-right midline shift mild hydrocephalus. Placed on intravenous labetalol. Mechanical soft nectar thick liquid diet. Physical therapy evaluation completed 07/06/2014 with recommendations of physical medicine rehabilitation consult  Patient has no pain complaints, denies any breathing issues, denies any numbness or tingling in the extremities Review of Systems  Gastrointestinal: Positive for constipation.  Musculoskeletal: Positive for myalgias.  Neurological: Positive for headaches.  All other systems reviewed and are negative.  History reviewed. No pertinent past medical history. History reviewed. No pertinent past surgical history. History reviewed. No pertinent family history. Social History:  reports that he has been smoking. He does not have any smokeless tobacco history on file. He reports that he drinks alcohol. His drug history is not on file. Allergies: No Known Allergies No prescriptions prior to admission    Home: Home Living Family/patient expects to be discharged to:: Inpatient rehab Living Arrangements: Alone Available Help at Discharge: Other (Comment) (unsure) Type of Home: House Additional Comments: unable to get specific information about the house due to lethargy Lives With: Alone  Functional  History: Prior Function Level of Independence: Independent Functional Status:  Mobility: Bed Mobility Overal bed mobility: +2 for physical assistance, Needs Assistance Bed Mobility: Supine to Sit, Sit to Supine Supine to sit: Max assist, +2 for physical assistance Sit to supine: Max assist, +2 for physical assistance General bed mobility comments: Pt attempting to get OOB moving L side. Unable to problem solve to assist with moving R side.  Transfers Overall transfer level: Needs assistance Equipment used: 2 person hand held assist Transfers: Sit to/from Stand Sit to Stand: +2 physical assistance, Mod assist General transfer comment: Pt able to facilitate forward weight shift for sit to stand. Noted motor impersistence RLE in standing. tactile cues to maintain upright midline postural control and for L w/shift. L bias. Looking L in standing.  Ambulation/Gait General Gait Details: not tested    ADL: ADL Overall ADL's : Needs assistance/impaired Eating/Feeding: Moderate assistance Eating/Feeding Details (indicate cue type and reason): modified diet. full S Grooming: Moderate assistance Grooming Details (indicate cue type and reason): able to bring cloth to face; cues to attend and complete task Upper Body Bathing: Maximal assistance, Sitting Lower Body Bathing: Maximal assistance, Sit to/from stand Upper Body Dressing : Total assistance Lower Body Dressing: Total assistance Toileting- Clothing Manipulation and Hygiene: Total assistance Functional mobility during ADLs: +2 for physical assistance, Moderate assistance (sit - stand only) General ADL Comments: Pt lethargic during eval requiring vc to increase level of arousal throughout session. Fatigued at end of session. Apparent perseveration during ADL tasks  Cognition: Cognition Overall Cognitive Status: Impaired/Different from baseline Arousal/Alertness: Awake/alert Orientation Level: Oriented to person Memory: Appears  intact Awareness: Impaired Awareness Impairment: Intellectual impairment Problem Solving: Impaired Problem Solving Impairment: Verbal complex Safety/Judgment: (TBD) Cognition Arousal/Alertness: Lethargic Behavior During Therapy: Flat affect, Impulsive, Restless Overall Cognitive Status: Impaired/Different from baseline Area of Impairment: Orientation, Attention, Following commands, Safety/judgement, Awareness,  Problem solving Orientation Level: Disoriented to, Place, Time, Situation Current Attention Level: Focused Following Commands: Follows one step commands inconsistently Safety/Judgement: Decreased awareness of safety, Decreased awareness of deficits Awareness: Intellectual Problem Solving: Slow processing, Decreased initiation, Difficulty sequencing, Requires verbal cues, Requires tactile cues  Blood pressure 130/81, pulse 82, temperature 98.3 F (36.8 C), temperature source Oral, resp. rate 18, height 6' (1.829 m), weight 64.774 kg (142 lb 12.8 oz), SpO2 91 %. Physical Exam  HENT:  Mild right facial droop  Eyes:  Pupils reactive to light  Neck: Normal range of motion. Neck supple. No thyromegaly present.  Cardiovascular: Normal rate and regular rhythm.  Respiratory: Effort normal and breath sounds normal. No respiratory distress.  GI: Soft. Bowel sounds are normal. He exhibits no distension.  Neurological: He is alert.  Left eye gaze preference. Severe dysarthria but intelligible for most words. He was able to provide his name but needed cues for date of birth. He could not state his age. Followed simple commands. Limited awareness of deficits  Skin: Skin is warm and dry.  Right central 7 Motor strength is trace in the low right biceps and triceps and grip and finger extensors Right lower extremity to minus hip flexion 3 minus knee extension 3 minus ankle dorsiflexion plantarflexion 4+/5 in left deltoid, biceps, triceps, grip, hip flexor, knee extensor, ankle dorsiflexor  and plantar flexor Patient is difficult to assess for visual field secondary to decreased attention and lethargy. He does appear to have right neglect and possible right homonymous hemianopsia Withdraws to pinch on right side but Delayed compared to the left side   Lab Results Last 24 Hours    Results for orders placed or performed during the hospital encounter of 07/04/14 (from the past 24 hour(s))  CBC Status: Abnormal   Collection Time: 07/06/14 2:20 AM  Result Value Ref Range   WBC 9.0 4.0 - 10.5 K/uL   RBC 5.33 4.22 - 5.81 MIL/uL   Hemoglobin 16.9 13.0 - 17.0 g/dL   HCT 16.1 09.6 - 04.5 %   MCV 88.0 78.0 - 100.0 fL   MCH 31.7 26.0 - 34.0 pg   MCHC 36.0 30.0 - 36.0 g/dL   RDW 40.9 81.1 - 91.4 %   Platelets 133 (L) 150 - 400 K/uL  Basic metabolic panel Status: Abnormal   Collection Time: 07/06/14 2:20 AM  Result Value Ref Range   Sodium 137 135 - 145 mmol/L   Potassium 4.0 3.5 - 5.1 mmol/L   Chloride 102 101 - 111 mmol/L   CO2 23 22 - 32 mmol/L   Glucose, Bld 99 65 - 99 mg/dL   BUN 21 (H) 6 - 20 mg/dL   Creatinine, Ser 7.82 0.61 - 1.24 mg/dL   Calcium 9.6 8.9 - 95.6 mg/dL   GFR calc non Af Amer >60 >60 mL/min   GFR calc Af Amer >60 >60 mL/min   Anion gap 12 5 - 15      Imaging Results (Last 48 hours)    Ct Head Wo Contrast  07/05/2014 CLINICAL DATA: Re-evaluate intracranial hemorrhage EXAM: CT HEAD WITHOUT CONTRAST TECHNIQUE: Contiguous axial images were obtained from the base of the skull through the vertex without intravenous contrast. COMPARISON: CT head Jul 04, 2014 FINDINGS: Evolving 3.3 x 2.3 cm LEFT thalamus/corona radiata hematoma with intraventricular extension, small amount of layering blood products in the occipital horns. Blood products extend to the anterior recess of third ventral, LEFT lateral ventricle with mild, similar mild suspected hydrocephalus.  Local  mass effect, 4 mm LEFT-to-RIGHT midline shift. Patchy to confluent supratentorial white matter hypodensities again noted. No acute large vascular territory infarct. Tortuous calcified anterior cerebral artery. No acute large vascular territory infarct. Basal cistern patent. No abnormal extra-axial fluid collections. Mild calcific atherosclerosis the carotid siphons. Mild paranasal sinus mucosal thickening without air-fluid levels. The mastoid air cells are well aerated. No skull fracture. Ocular globes and orbital contents are unremarkable peer IMPRESSION: Evolving LEFT thalamic 3.3 x 2.3 cm hematoma with intraventricular extension. 4 mm LEFT-to-RIGHT midline shift. Mild hydrocephalus suspected. Moderate white matter changes suggest chronic small vessel ischemic disease. Electronically Signed By: Awilda Metroourtnay Bloomer M.D. On: 07/05/2014 06:20   Ct Head Wo Contrast  07/04/2014 CLINICAL DATA: Code stroke. Found down at nursing home. EXAM: CT HEAD WITHOUT CONTRAST TECHNIQUE: Contiguous axial images were obtained from the base of the skull through the vertex without intravenous contrast. COMPARISON: None. FINDINGS: There is an at least 3.2 x 2.5 cm hemorrhage is located within the left thalamus (image 19, series 4) with resultant mass effect and approximately 6 mm of left-to-right midline shift. There is extension of this early intraparenchymal hemorrhage well the adjacent left lateral ventricular system (representative images 20 and 21, series 4 with trace amount of blood seen within the third ventricle (image 17, series 4). Bilateral basal ganglial calcifications. Scattered periventricular hypodensities compatible microvascular ischemic disease. No CT evidence of acute large territory infarct. Punctate (approximately 3 mm) calcification about the anterior medial aspect of the left frontal lobe (image 18, series 4) is nonspecific with differential considerations including the sequela of prior  infection or a vascular calcification. No definite intraparenchymal or extra-axial mass. Scattered opacification of the right posterior ethmoidal air cells. The remaining paranasal sinuses and mastoid air cells are normally aerated. No air-fluid levels. Regional soft tissues appear normal. No displaced calvarial fracture. IMPRESSION: Acute approximately 3.2 cm presumed hypertensive intraparenchymal left thalamic hemorrhage with intraventricular extension and a minimal (approximately 6 mm) of left-to-right midline shift. Critical Value/emergent results were called by telephone at the time of interpretation on 07/04/2014 at 7:02 pm to Dr. Hosie PoissonSumner, who verbally acknowledged these results. Electronically Signed By: Simonne ComeJohn Watts M.D. 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 Does the need for close, 24 hr/day medical supervision in concert with the patient's rehab needs make it unreasonable for this patient to be served in a less intensive setting? Yes 2. Co-Morbidities requiring supervision/potential complications: Hypertension, cognitive deficits 3. Due to bladder management, bowel management, safety, skin/wound care, disease management, medication administration and patient education, does the patient require 24 hr/day rehab nursing? Yes 4. Does the patient require coordinated care of a physician, rehab nurse, PT (11-2 hrs/day, 5 days/week), OT (1-2 hrs/day, 5 days/week) and SLP (0.5-1 hrs/day, 5 days/week) to address physical and functional deficits in the context of the above medical diagnosis(es)? Yes Addressing deficits in the following areas: balance, endurance, locomotion, strength, transferring, bowel/bladder control, bathing, dressing, feeding, grooming, toileting, cognition, speech, language, swallowing, psychosocial support and safety 5. Can the patient actively  participate in an intensive therapy program of at least 3 hrs of therapy per day at least 5 days per week? not currently but probably in the next 1-2 days 6. The potential for patient to make measurable gains while on inpatient rehab is good 7. Anticipated functional outcomes upon discharge from inpatient rehab are supervision with PT, supervision with OT, supervision with SLP. 8. Estimated rehab  length of stay to reach the above functional goals is: 18-21 days 9. Does the patient have adequate social supports and living environment to accommodate these discharge functional goals? Yes 10. Anticipated D/C setting: Home 11. Anticipated post D/C treatments: HH therapy 12. Overall Rehab/Functional Prognosis: good  RECOMMENDATIONS: This patient's condition is appropriate for continued rehabilitative care in the following setting: CIR Patient has agreed to participate in recommended program. Potentially Note that insurance prior authorization may be required for reimbursement for recommended care.  Comment: See above, anticipated need for 24 7 supervision post discharge    07/06/2014       Revision History     Date/Time User Provider Type Action   07/06/2014 4:10 PM Erick Colace, MD Physician Sign   07/06/2014 3:10 PM Charlton Amor, PA-C Physician Assistant Pend   View Details Report       Routing History     Date/Time From To Method   07/06/2014 4:10 PM Erick Colace, MD Erick Colace, MD In Basket   07/06/2014 4:10 PM Erick Colace, MD No Pcp Per Patient In Basket

## 2014-07-07 NOTE — H&P (View-Only) (Signed)
Physical Medicine and Rehabilitation Admission H&P   Chief Complaint  Patient presents with  . Code Stroke  : HPI: Michael Goodwin is a 69 y.o. right handed male with history of tobacco abuse, hypertension on no prescription medications. Patient lives alone independent prior to admission. Presented 07/04/2014 with right-sided weakness and altered mental status. CT the head showed left ICH with intraventricular extension. Blood pressure 173/103. Carotid Doppler showed no ICA stenosis. Echocardiogram with ejection fraction of 60% no wall motion abnormalities. Follow-up cranial CT scan 07/05/2014 showed evolving left thalamic 3.3 x 2.3 cm hematoma with 4 mm left-to-right midline shift mild hydrocephalus. Placed on intravenous labetalol for blood pressure control. Subcutaneous heparin initiated for DVT prophylaxis 07/06/2014. On 07/07/2014 question somnolence altered mental status with again cranial CT scan completed showing no significant change in therapy is resumed. Mechanical soft thin liquid diet. Physical and occupational therapy evaluations completed 07/06/2014 with recommendations of physical medicine rehabilitation consult. Patient was admitted for comprehensive rehabilitation program  Patient is sleeping but awakens to voice, per nursing he has been active and pulled off his condom catheter and pulled out an IV today. By mouth intake is still poor  ROS Review of Systems  Gastrointestinal: Positive for constipation.  Musculoskeletal: Positive for myalgias.  Neurological: Positive for headaches.  All other systems reviewed and are negative    History reviewed. No pertinent past medical history. History reviewed. No pertinent past surgical history. History reviewed. No pertinent family history. Social History:  reports that he has been smoking. He does not have any smokeless tobacco history on file. He reports that he drinks alcohol. His drug history is not on file. Allergies: No  Known Allergies No prescriptions prior to admission    Home: Home Living Family/patient expects to be discharged to:: Inpatient rehab Living Arrangements: Alone Available Help at Discharge: Other (Comment) (unsure) Type of Home: House Additional Comments: unable to get specific information about the house due to lethargy Lives With: Alone  Functional History: Prior Function Level of Independence: Independent  Functional Status:  Mobility: Bed Mobility Overal bed mobility: +2 for physical assistance, Needs Assistance Bed Mobility: Supine to Sit, Sit to Supine Supine to sit: Max assist, +2 for physical assistance Sit to supine: Max assist, +2 for physical assistance General bed mobility comments: Pt attempting to get OOB moving L side. Unable to problem solve to assist with moving R side.  Transfers Overall transfer level: Needs assistance Equipment used: 2 person hand held assist Transfers: Sit to/from Stand Sit to Stand: +2 physical assistance, Mod assist General transfer comment: Pt able to facilitate forward weight shift for sit to stand. Noted motor impersistence RLE in standing. tactile cues to maintain upright midline postural control and for L w/shift. L bias. Looking L in standing.  Ambulation/Gait General Gait Details: not tested    ADL: ADL Overall ADL's : Needs assistance/impaired Eating/Feeding: Moderate assistance Eating/Feeding Details (indicate cue type and reason): modified diet. full S Grooming: Moderate assistance Grooming Details (indicate cue type and reason): able to bring cloth to face; cues to attend and complete task Upper Body Bathing: Maximal assistance, Sitting Lower Body Bathing: Maximal assistance, Sit to/from stand Upper Body Dressing : Total assistance Lower Body Dressing: Total assistance Toileting- Clothing Manipulation and Hygiene: Total assistance Functional mobility during ADLs: +2 for physical assistance, Moderate assistance (sit  - stand only) General ADL Comments: Pt lethargic during eval requiring vc to increase level of arousal throughout session. Fatigued at end of session. Apparent perseveration during ADL tasks  Cognition: Cognition Overall Cognitive Status: Impaired/Different from baseline Arousal/Alertness: Awake/alert Orientation Level: Oriented to person Memory: Appears intact Awareness: Impaired Awareness Impairment: Intellectual impairment Problem Solving: Impaired Problem Solving Impairment: Verbal complex Safety/Judgment: (TBD) Cognition Arousal/Alertness: Lethargic Behavior During Therapy: Flat affect, Impulsive, Restless Overall Cognitive Status: Impaired/Different from baseline Area of Impairment: Orientation, Attention, Following commands, Safety/judgement, Awareness, Problem solving Orientation Level: Disoriented to, Place, Time, Situation Current Attention Level: Focused Following Commands: Follows one step commands inconsistently Safety/Judgement: Decreased awareness of safety, Decreased awareness of deficits Awareness: Intellectual Problem Solving: Slow processing, Decreased initiation, Difficulty sequencing, Requires verbal cues, Requires tactile cues  Physical Exam: Blood pressure 150/85, pulse 56, temperature 98 F (36.7 C), temperature source Axillary, resp. rate 18, height 6' (1.829 m), weight 64.774 kg (142 lb 12.8 oz), SpO2 100 %. Physical Exam HENT:  Mild right facial droop  Eyes:  Pupils reactive to light  Neck: Normal range of motion. Neck supple. No thyromegaly present.  Cardiovascular: Normal rate and regular rhythm.  Respiratory: Effort normal and breath sounds normal. No respiratory distress.  GI: Soft. Bowel sounds are normal. He exhibits no distension.  Neurological: He is alert.  Left eye gaze preference. Severe dysarthria but intelligible for most words. He was able to provide his name but needed cues for date of birth. He could not state his age. Followed  simple commands. Limited awareness of deficits  Skin: Skin is warm and dry.  Right central 7 Motor strength is trace in the low right biceps and triceps and grip and finger extensors Right lower extremity to minus hip flexion 3 minus knee extension 3 minus ankle dorsiflexion plantarflexion 4+/5 in left deltoid, biceps, triceps, grip, hip flexor, knee extensor, ankle dorsiflexor and plantar flexor Patient is difficult to assess for visual field secondary to decreased attention and lethargy. He does appear to have right neglect and possible right homonymous hemianopsia Withdraws to pinch on right side but Delayed compared to the left side    Lab Results Last 48 Hours    Results for orders placed or performed during the hospital encounter of 07/04/14 (from the past 48 hour(s))  CBC Status: None   Collection Time: 07/05/14 8:55 AM  Result Value Ref Range   WBC 9.8 4.0 - 10.5 K/uL   RBC 4.57 4.22 - 5.81 MIL/uL   Hemoglobin 13.6 13.0 - 17.0 g/dL   HCT 40.1 39.0 - 52.0 %   MCV 87.7 78.0 - 100.0 fL   MCH 29.8 26.0 - 34.0 pg   MCHC 33.9 30.0 - 36.0 g/dL   RDW 14.1 11.5 - 15.5 %   Platelets 172 150 - 400 K/uL  Basic metabolic panel Status: Abnormal   Collection Time: 07/05/14 8:55 AM  Result Value Ref Range   Sodium 139 135 - 145 mmol/L   Potassium 3.7 3.5 - 5.1 mmol/L   Chloride 102 101 - 111 mmol/L   CO2 26 22 - 32 mmol/L   Glucose, Bld 105 (H) 65 - 99 mg/dL   BUN 19 6 - 20 mg/dL   Creatinine, Ser 1.29 (H) 0.61 - 1.24 mg/dL   Calcium 10.0 8.9 - 10.3 mg/dL   GFR calc non Af Amer 55 (L) >60 mL/min   GFR calc Af Amer >60 >60 mL/min    Comment: (NOTE) The eGFR has been calculated using the CKD EPI equation. This calculation has not been validated in all clinical situations. eGFR's persistently <60 mL/min signify possible Chronic Kidney Disease.    Anion gap 11 5 - 15  Lipid  panel Status: None   Collection Time: 07/05/14 8:55 AM  Result Value Ref Range   Cholesterol 162 0 - 200 mg/dL   Triglycerides 62 <150 mg/dL   HDL 95 >40 mg/dL   Total CHOL/HDL Ratio 1.7 RATIO   VLDL 12 0 - 40 mg/dL   LDL Cholesterol 55 0 - 99 mg/dL    Comment:   Total Cholesterol/HDL:CHD Risk Coronary Heart Disease Risk Table  Men Women 1/2 Average Risk 3.4 3.3 Average Risk 5.0 4.4 2 X Average Risk 9.6 7.1 3 X Average Risk 23.4 11.0   Use the calculated Patient Ratio above and the CHD Risk Table to determine the patient's CHD Risk.   ATP III CLASSIFICATION (LDL): <100 mg/dL Optimal 100-129 mg/dL Near or Above  Optimal 130-159 mg/dL Borderline 160-189 mg/dL High >190 mg/dL Very High   Hemoglobin A1c Status: Abnormal   Collection Time: 07/05/14 8:55 AM  Result Value Ref Range   Hgb A1c MFr Bld 5.8 (H) 4.8 - 5.6 %    Comment: (NOTE)  Pre-diabetes: 5.7 - 6.4  Diabetes: >6.4  Glycemic control for adults with diabetes: <7.0    Mean Plasma Glucose 120 mg/dL    Comment: (NOTE) Performed At: [facility] [place], [place] [id number] Lindon Romp MD YK:[id number]   TSH Status: None   Collection Time: 07/05/14 8:55 AM  Result Value Ref Range   TSH 1.554 0.350 - 4.500 uIU/mL  CBC Status: Abnormal   Collection Time: 07/06/14 2:20 AM  Result Value Ref Range   WBC 9.0 4.0 - 10.5 K/uL    Comment: WHITE COUNT CONFIRMED ON SMEAR   RBC 5.33 4.22 - 5.81 MIL/uL   Hemoglobin 16.9 13.0 - 17.0 g/dL   HCT 46.9 39.0 - 52.0 %   MCV 88.0 78.0 - 100.0 fL   MCH 31.7 26.0 - 34.0 pg   MCHC 36.0 30.0 - 36.0 g/dL   RDW 14.1 11.5 - 15.5 %   Platelets 133 (L) 150 - 400 K/uL    Comment: PLATELET COUNT  CONFIRMED BY SMEAR  Basic metabolic panel Status: Abnormal   Collection Time: 07/06/14 2:20 AM  Result Value Ref Range   Sodium 137 135 - 145 mmol/L   Potassium 4.0 3.5 - 5.1 mmol/L   Chloride 102 101 - 111 mmol/L   CO2 23 22 - 32 mmol/L   Glucose, Bld 99 65 - 99 mg/dL   BUN 21 (H) 6 - 20 mg/dL   Creatinine, Ser 1.14 0.61 - 1.24 mg/dL   Calcium 9.6 8.9 - 10.3 mg/dL   GFR calc non Af Amer >60 >60 mL/min   GFR calc Af Amer >60 >60 mL/min    Comment: (NOTE) The eGFR has been calculated using the CKD EPI equation. This calculation has not been validated in all clinical situations. eGFR's persistently <60 mL/min signify possible Chronic Kidney Disease.    Anion gap 12 5 - 15  Basic metabolic panel Status: Abnormal   Collection Time: 07/07/14 4:30 AM  Result Value Ref Range   Sodium 138 135 - 145 mmol/L   Potassium 3.4 (L) 3.5 - 5.1 mmol/L   Chloride 103 101 - 111 mmol/L   CO2 27 22 - 32 mmol/L   Glucose, Bld 118 (H) 65 - 99 mg/dL   BUN 21 (H) 6 - 20 mg/dL   Creatinine, Ser 1.16 0.61 - 1.24 mg/dL   Calcium 9.8 8.9 - 10.3 mg/dL   GFR calc non Af Amer >60 >60  mL/min   GFR calc Af Amer >60 >60 mL/min    Comment: (NOTE) The eGFR has been calculated using the CKD EPI equation. This calculation has not been validated in all clinical situations. eGFR's persistently <60 mL/min signify possible Chronic Kidney Disease.    Anion gap 8 5 - 15      Imaging Results (Last 48 hours)    No results found.       Medical Problem List and Plan: 1. 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 DVT Prophylaxis/Anticoagulation: Subcutaneous heparin for DVT prophylaxis initiated 07/06/2014 3. Pain Management: Tylenol as needed 4. Dysphagia. Dysphagia 3 thin liquids. Monitor for any signs of aspiration. Follow-up  speech therapy.IVFs for hydration due to decreased nutritional storage and hydration 5. Neuropsych: This patient is capable of making decisions on his own behalf. 6. Skin/Wound Care: Routine skin checks 7. Fluids/Electrolytes/Nutrition: Routine I nose with follow-up chemistries 8. Hypertension. Lisinopril 20 mg daily. Monitor with increased mobility 9. Tobacco abuse, counseling    Post Admission Physician Evaluation: Functional deficits secondary to left thalamic intracranial hemorrhage secondary to hypertensive crisisWith right hemiparesis and cognitive deficits 1. Patient is admitted to receive collaborative, interdisciplinary care between the physiatrist, rehab nursing staff, and therapy team. 2. Patient's level of medical complexity and substantial therapy needs in context of that medical necessity cannot be provided at a lesser intensity of care such as a SNF. 3. Patient has experienced substantial functional loss from his/her baseline which was documented above under the "Functional History" and "Functional Status" headings. Judging by the patient's diagnosis, physical exam, and functional history, the patient has potential for functional progress which will result in measurable gains while on inpatient rehab. These gains will be of substantial and practical use upon discharge in facilitating mobility and self-care at the household level. 4. Physiatrist will provide 24 hour management of medical needs as well as oversight of the therapy plan/treatment and provide guidance as appropriate regarding the interaction of the two. 5. 24 hour rehab nursing will assist with bladder management, bowel management, safety, skin/wound care, disease management, medication administration, pain management and patient education and help integrate therapy concepts, techniques,education, etc. 6. PT will assess and treat for/with: pre gait, gait training, endurance , safety, equipment, neuromuscular re  education. Goals are: Sup/minA. 7. OT will assess and treat for/with: ADLs, Cognitive perceptual skills, Neuromuscular re education, safety, endurance, equipment. Goals are: Sup/MinA. Therapy may proceed with showering this patient. 8. SLP will assess and treat for/with: Memory, attention, concentration, attention toward the right side, medication management Goals are: Min assist medication management. 9. Case Management and Social Worker will assess and treat for psychological issues and discharge planning. 10. Team conference will be held weekly to assess progress toward goals and to determine barriers to discharge. 11. Patient will receive at least 3 hours of therapy per day at least 5 days per week. 12. ELOS: 18-21 days  13. Prognosis: fair     Charlett Blake M.D. [facility] FAAPM&R (Sports Med, Neuromuscular Med) Diplomate Am Board of Electrodiagnostic Med  07/07/2014

## 2014-07-07 NOTE — Interval H&P Note (Signed)
Michael Goodwin was admitted today to Inpatient Rehabilitation with the diagnosis of Left thalamic intracranial hemoffhage secondary to hypertensive crisis with right hemiparesis and cognitive deficits.  The patient's history has been reviewed, patient examined, and there is no change in status.  Patient continues to be appropriate for intensive inpatient rehabilitation.  I have reviewed the patient's chart and labs.  Questions were answered to the patient's satisfaction. The PAPE has been reviewed and assessment remains appropriate.  Michael Goodwin,Michael Goodwin 07/07/2014, 8:09 PM

## 2014-07-07 NOTE — Progress Notes (Signed)
PT Cancellation Note  Patient Details Name: Michael ArbourClyde Goodwin MRN: [id number] DOB: January 05, 1946   Cancelled Treatment:    Reason Eval/Treat Not Completed: Medical issues which prohibited therapy, patient for head CT    Fabio AsaWerner, Haelyn Forgey J 07/07/2014, 9:41 AM Charlotte Crumbevon Chastin Riesgo, PT DPT  [phone]

## 2014-07-08 ENCOUNTER — Inpatient Hospital Stay (HOSPITAL_COMMUNITY): Payer: Medicare Other | Admitting: Speech Pathology

## 2014-07-08 ENCOUNTER — Inpatient Hospital Stay (HOSPITAL_COMMUNITY): Payer: Medicare Other | Admitting: Occupational Therapy

## 2014-07-08 ENCOUNTER — Inpatient Hospital Stay (HOSPITAL_COMMUNITY): Payer: Medicare Other | Admitting: *Deleted

## 2014-07-08 DIAGNOSIS — I610 Nontraumatic intracerebral hemorrhage in hemisphere, subcortical: Secondary | ICD-10-CM

## 2014-07-08 MED ORDER — POTASSIUM CHLORIDE CRYS ER 20 MEQ PO TBCR
40.0000 meq | EXTENDED_RELEASE_TABLET | Freq: Once | ORAL | Status: AC
  Administered 2014-07-08: 40 meq via ORAL
  Filled 2014-07-08: qty 2

## 2014-07-08 MED ORDER — POTASSIUM CHLORIDE 20 MEQ PO PACK
40.0000 meq | PACK | Freq: Once | ORAL | Status: DC
  Filled 2014-07-08: qty 2

## 2014-07-08 NOTE — Evaluation (Signed)
Speech Language Pathology Assessment and Plan  Patient Details  Name: Jaesean Litzau MRN: [id number] Date of Birth: 1945/04/17  SLP Diagnosis: Cognitive Impairments;Dysphagia  Rehab Potential: Fair ELOS: 18-21 days    Today's Date: 07/08/2014 SLP Individual Time: 9476-5465 SLP Individual Time Calculation (min): 54 min and Today's Date: 07/08/2014 SLP Missed Time: 6 Minutes Missed Time Reason: Patient fatigue   Problem List:  Patient Active Problem List   Diagnosis Date Noted  . Acute respiratory failure with hypoxia 07/07/2014  . Abnormal urinalysis 07/07/2014  . Hypokalemia 07/07/2014  . Dehydration 07/07/2014  . Metabolic encephalopathy 03/54/6568  . Aortic root dilatation (48 mm May 2016) 07/07/2014  . Ascending aorta dilatation (39m May 2016) 07/07/2014  . ICH (intracerebral hemorrhage) 07/07/2014  . 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 Hemiplegia affecting right dominant side 07/06/2014  . Cognitive deficit due to recent stroke 07/06/2014  . IVH (intraventricular hemorrhage) 07/06/2014  . Essential hypertension 07/06/2014  . Intracranial hemorrhage    Past Medical History: No past medical history on file. Past Surgical History: No past surgical history on file.  Assessment / Plan / Recommendation Clinical Impression CRiven Mabileis a 69year old right handed male with history of tobacco abuse, hypertension on no prescription medications. Patient lives alone independent prior to admission. Presented 07/04/2014 with right-sided weakness and altered mental status. CT the head showed left ICH with intraventricular extension. Follow-up cranial CT scan 07/05/2014 showed evolving left thalamic 3.3 x 2.3 cm hematoma with 4 mm left-to-right midline shift mild hydrocephalus. Placed on intravenous labetalol for blood pressure control. Subcutaneous heparin initiated for DVT prophylaxis 07/06/2014. On 07/07/2014 question somnolence altered mental status with again cranial CT  scan completed showing no significant change in therapy is resumed. Mechanical soft thin liquid diet.  [facility] and demonstrates severe cognitive impairments as well as moderate dysphagia impacting safety awareness, memory, insight/judgment, problem solving, nutrition, etc which impacts the patient's overall safety with functional tasks.  Patient displayed moderate oral residue from prior meal; patient displayed limited awareness of oral residue until oral care provided.  His diet was downgraded to dysphagia 2 with nectar thick liquids by cup on full supervision with the appropriate swallow strategies (i.e. alternate liquids with solids, small bites/sips, no straws, check for right sided pocketing, and provide oral care after meals).  Patient would benefit from skilled SLP intervention to maximize basic cognition/linguistic skills and dysphagia treatment, in order to maximize his functional independence prior to discharge.  Anticipate patient will either require SNF placement or 24 hour supervision at home and follow up SLP services.   Skilled Therapeutic Interventions          Skilled bedside dysphagia and cog/ling evaluation completed. Reviewed results and recommendations with patient; family not present.  SLP Assessment  Patient will need skilled Speech Lanaguage Pathology Services during CIR admission    Recommendations  SLP Diet Recommendations: Dysphagia 2 (Fine chop);Nectar Liquid Administration via: Cup;No straw Medication Administration: Crushed with puree Supervision: Full supervision/cueing for compensatory strategies;Staff to assist with self feeding Compensations: Slow rate;Small sips/bites;Check for pocketing;Follow solids with liquid Postural Changes and/or Swallow Maneuvers: Seated upright 90 degrees;Upright 30-60 min after meal Oral Care Recommendations: Oral care BID Patient destination: [facility](SNF) Follow up  Recommendations: Skilled Nursing facility Equipment Recommended: None recommended by SLP    SLP Frequency 3 to 5 out of 7 days   SLP Treatment/Interventions Cognitive remediation/compensation;Cueing hierarchy;Dysphagia/aspiration precaution training;Functional tasks;Environmental controls;Internal/external aids;Medication managment;Multimodal communication approach;Patient/family education;Speech/Language  facilitation;Oral motor exercises    Pain Pain Assessment Pain Assessment: No/denies pain Prior Functioning Cognitive/Linguistic Baseline: Baseline deficits Baseline deficit details: Dysfuency since a child (part-word repetitions when initiating an utterance. Type of Home: House  Lives With: Alone Available Help at Discharge: Family;Available 24 hours/day Education: 7th grade education Vocation: Retired  Industrial/product designer Term Goals: Week 1: SLP Short Term Goal 1 (Week 1): Pt will tolerate dysphagia 2 with nectar thick liquids using swallow strategies with min A given verbal/visual/tactile cueing. SLP Short Term Goal 2 (Week 1): Pt will sustain attention to functional tasks for at least 5 minutes with mod A given multimodal cueing. SLP Short Term Goal 3 (Week 1): Pt will be oriented x4 with visual external aids with max A given multimodal cueing. SLP Short Term Goal 4 (Week 1): Pt will recall biographical information with mod A given multimodal cueing. SLP Short Term Goal 5 (Week 1): Pt will demonstrate basic problem solving in functional tasks with max A given multimodal cueing.  See FIM for current functional status Refer to Care Plan for Long Term Goals  Recommendations for other services: Neuropsych  Discharge Criteria: Patient will be discharged from SLP if patient refuses treatment 3 consecutive times without medical reason, if treatment goals not met, if there is a change in medical status, if patient makes no progress towards goals or if patient is discharged from hospital.  The above  assessment, treatment plan, treatment alternatives and goals were discussed and mutually agreed upon: by patient and No family available/patient unable  Annabell Howells, MA CCC-SLP Annabell Howells A 07/08/2014, 6:28 PM

## 2014-07-08 NOTE — Evaluation (Signed)
Physical Therapy Assessment and Plan  Patient Details  Name: Michael Goodwin MRN: [id number] Date of Birth: [birth year]  PT Diagnosis: Hemiplegia dominant Rehab Potential: Good ELOS: 2 to 2.5 weeks   Today's Date: 07/08/2014 PT Individual Time: 6979-4801 PT Individual Time Calculation (min): 60 min    Problem List:  Patient Active Problem List   Diagnosis Date Noted  . Acute respiratory failure with hypoxia 07/07/2014  . Abnormal urinalysis 07/07/2014  . Hypokalemia 07/07/2014  . Dehydration 07/07/2014  . Metabolic encephalopathy 65/53/7482  . Aortic root dilatation (48 mm May 2016) 07/07/2014  . Ascending aorta dilatation (77m May 2016) 07/07/2014  . ICH (intracerebral hemorrhage) 07/07/2014  . 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 Hemiplegia affecting right dominant side 07/06/2014  . Cognitive deficit due to recent stroke 07/06/2014  . IVH (intraventricular hemorrhage) 07/06/2014  . Essential hypertension 07/06/2014  . Intracranial hemorrhage     Past Medical History: No past medical history on file. Past Surgical History: No past surgical history on file.  Assessment & Plan Clinical Impression Patient transferred to CIR on 07/07/2014 . Michael Goodwin a 69y.o. right handed male with history of tobacco abuse, hypertension on no prescription medications. Patient lives alone independent prior to admission. Presented 07/04/2014 with right-sided weakness and altered mental status. CT the head showed left ICH with intraventricular extension. Blood pressure 173/103. Carotid Doppler showed no ICA stenosis. Echocardiogram with ejection fraction of 60% no wall motion abnormalities. Follow-up cranial CT scan 07/05/2014 showed evolving left thalamic 3.3 x 2.3 cm hematoma with 4 mm left-to-right midline shift mild hydrocephalus. Placed on intravenous labetalol for blood pressure control. Subcutaneous heparin initiated for DVT prophylaxis 07/06/2014. On 07/07/2014 question somnolence  altered mental status with again cranial CT scan completed showing no significant change in therapy is resumed. Mechanical soft thin liquid diet.   Patient currently requires max with mobility secondary to decreased coordination.  Prior to hospitalization, patient was independent  with mobility and lived alone in  a single story house with 4 steps at front entrance and a ramp at the back of the house. Patient did not use and AD for mobility PTA.  Patient will benefit from skilled PT intervention to maximize safe functional mobility for planned discharge hone with 24 hrs supervision vs SNF pending progress.     Skilled Therapeutic Intervention Patient evaluation completed and treatment initiated. W/C has been issued and left in room with R side lap trey and B leg rests,QR belt needed for when in chair, as patient can be impulsive and attempt to stand up. Bed mobility has been assessed and patient need mod A for rolling to R and Max for L side, supine to sit with max A, Strong R side push, and severely decreased ability to maintain sitting w/o support. Sit to stand from bed with max A and manual assistance to block R knee as patient is not able to accept any weight on R side. Transfer to w/c with max A of two to assure safety. Attempted w/c propulsion, due to patient being lethargic unable to participate. With increased cues patient was able to stand up by the side rail on L and with max A, attempted gait ,able to complete 1 step with max A of 2. Patient unable to participate in stair training at the time of initial treatment. Transfer back to be with max A and max A for positioning, patient with R side neglect,does not realize that his R LE was off bed,also noted sensory deficits.  Patient would benefit from skilled services in order to increase ability to perform functional mobility , transfers and reduce burden of care. No complains of pain during session. Bed alarm on.   PT  Evaluation  General Chart Reviewed: Yes Vital Signs Pain Pain Assessment Pain Assessment: No/denies pain Home Living/Prior Functioning Home Living Available Help at Discharge: Family;Available 24 hours/day Type of Home: House Home Access: Stairs to enter [employer] of Steps: 4 up front and ramp at the back of the house Entrance Stairs-Rails: None Home Layout: One level  Lives With: Alone Prior Function Level of Independence: Independent with basic ADLs;Independent with transfers;Independent with gait  Able to Take Stairs?: Reciprically Driving: No Vocation: Retired Comments: Patient dropped out of school in 7th grade, unable to read,not driving   Cognition Overall Cognitive Status: Impaired/Different from baseline Arousal/Alertness: Lethargic Orientation Level: Oriented to person Awareness: Impaired Awareness Impairment: Intellectual impairment Problem Solving: Impaired Safety/Judgment: Impaired Sensation Sensation Light Touch: Impaired by gross assessment Proprioception: Impaired by gross assessment Coordination Gross Motor Movements are Fluid and Coordinated: No Fine Motor Movements are Fluid and Coordinated: No Motor  Motor Motor: Hemiplegia  Mobility Bed Mobility Bed Mobility: Rolling Right;Rolling Left Rolling Right: 3: Mod assist Rolling Left: 3: Mod assist Transfers Transfers: Yes Sit to Stand: 2: Max assist Sit to Stand Details: Manual facilitation for weight shifting;Manual facilitation for placement;Tactile cues for posture Stand Pivot Transfers: 2: Max assist (2 person assist for safety) Locomotion  Ambulation Ambulation: Yes Ambulation/Gait Assistance: 1: +1 Total assist Ambulation Distance (Feet): 1 Feet Assistive device: 1 person hand held assist Ambulation/Gait Assistance Details: Manual facilitation for placement;Manual facilitation for weight bearing Gait Gait: No Stairs / Additional Locomotion Stairs: No Ramp: Not tested  (comment) (unable to test) Wheelchair Mobility Wheelchair Mobility: Yes Wheelchair Assistance: 1: +1 Total assist Wheelchair Propulsion: Other (comment) (unable to propel w/c.)  Trunk/Postural Assessment  Postural Control Postural Control: Deficits on evaluation Trunk Control: Strong R side lean and push Righting Reactions: Patient unable to righten self with LOB  Balance Balance Balance Assessed: Yes Static Sitting Balance Static Sitting - Level of Assistance: 2: Max assist Static Sitting - Comment/# of Minutes: 3 minutes Dynamic Sitting Balance Sitting balance - Comments: Pushing to R side, R side inattention Static Standing Balance Static Standing - Level of Assistance: 2: Max assist Static Standing - Comment/# of Minutes: 30 seconds with strong pushing to R and inability to weight shift or accept weight on R Extremity Assessment  RLE Assessment RLE Assessment: Exceptions to [facility] RLE Strength Right Hip Flexion: 2-/5 Right Knee Flexion: 2-/5 Right Knee Extension: 2-/5 Right Ankle Dorsiflexion: 0/5  FIM:  FIM - Bed/Chair Transfer Bed/Chair Transfer Assistive Devices: HOB elevated Bed/Chair Transfer: 2: Supine > Sit: Max A (lifting assist/Pt. 25-49%);2: Bed > Chair or W/C: Max A (lift and lower assist);2: Chair or W/C > Bed: Max A (lift and lower assist) FIM - Locomotion: Ambulation Ambulation/Gait Assistance: 1: +1 Total assist Locomotion: Ambulation: 1: Travels less than 50 ft with total assistance/helper does all (Pt.<25%) FIM - Locomotion: Stairs Locomotion: Stairs: 0: Activity did not occur   Refer to Care Plan for Long Term Goals  Recommendations for other services: Neuropsych  Discharge Criteria: Patient will be discharged from PT if patient refuses treatment 3 consecutive times without medical reason, if treatment goals not met, if there is a change in medical status, if patient makes no progress towards goals or if patient is discharged from hospital.  The above  assessment, treatment plan,  treatment alternatives and goals were discussed and mutually agreed upon: by patient  Guadlupe Spanish 07/08/2014, 12:31 PM

## 2014-07-08 NOTE — Evaluation (Signed)
Occupational Therapy Assessment and Plan  Patient Details  Name: Michael Goodwin MRN: [id number] Date of Birth: 07/14/1945  OT Diagnosis: abnormal posture, blindness and low vision, cognitive deficits, disturbance of vision, hemiplegia affecting dominant side and muscle weakness (generalized) Rehab Potential: Rehab Potential (ACUTE ONLY): Good ELOS: 2.5 to 3 weeks   Today's Date: 07/08/2014 OT Individual Time: 1715-1830 OT Individual Time Calculation (min): 75 min     Problem List:  Patient Active Problem List   Diagnosis Date Noted  . Acute respiratory failure with hypoxia 07/07/2014  . Abnormal urinalysis 07/07/2014  . Hypokalemia 07/07/2014  . Dehydration 07/07/2014  . Metabolic encephalopathy 62/26/3335  . Aortic root dilatation (48 mm May 2016) 07/07/2014  . Ascending aorta dilatation (71m May 2016) 07/07/2014  . ICH (intracerebral hemorrhage) 07/07/2014  . 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 Hemiplegia affecting right dominant side 07/06/2014  . Cognitive deficit due to recent stroke 07/06/2014  . IVH (intraventricular hemorrhage) 07/06/2014  . Essential hypertension 07/06/2014  . Intracranial hemorrhage     Past Medical History: No past medical history on file. Past Surgical History: No past surgical history on file.  Assessment & Plan Clinical Impression: Clinical Impression Patient transferred to CIR on 07/07/2014 . CWarwick Nickis a 69y.o. right handed male with history of tobacco abuse, hypertension on no prescription medications. Patient lives alone independent prior to admission. Presented 07/04/2014 with right-sided weakness and altered mental status. CT the head showed left ICH with intraventricular extension. Blood pressure 173/103. Carotid Doppler showed no ICA stenosis. Echocardiogram with ejection fraction of 60% no wall motion abnormalities. Follow-up cranial CT scan 07/05/2014 showed evolving left thalamic 3.3 x 2.3 cm hematoma with 4 mm left-to-right  midline shift mild hydrocephalus. Placed on intravenous labetalol for blood pressure control. Subcutaneous heparin initiated for DVT prophylaxis 07/06/2014. On 07/07/2014 question somnolence altered mental status with again cranial CT scan completed showing no significant change in therapy is resumed. .  Patient transferred to CIR on 07/07/2014 .    Patient currently requires total with basic self-care skills secondary to muscle weakness and muscle paralysis, impaired timing and sequencing, abnormal tone, unbalanced muscle activation, motor apraxia, decreased coordination and decreased motor planning, decreased visual acuity and decreased visual perceptual skills, decreased midline orientation, decreased attention to right, right side neglect and decreased motor planning and decreased initiation, decreased attention, decreased awareness, decreased problem solving, decreased safety awareness, decreased memory and delayed processing.  Prior to hospitalization, patient could complete BADL with independent .  Patient will benefit from skilled intervention to increase independence with basic self-care skills prior to discharge home with care partner.  Anticipate patient will require 24 hour supervision and follow up home health.  OT - End of Session Activity Tolerance: Tolerates < 10 min activity, no significant change in vital signs (Pt. very sleepy during OT assessment) Endurance Deficit: Yes Endurance Deficit Description: Kept eyes closed during 80 $% of assessment OT Assessment Rehab Potential (ACUTE ONLY): Good Barriers to Discharge: Decreased caregiver support Barriers to Discharge Comments: Educated 2 nieces about pt needing 24/7 after D/c OT Patient demonstrates impairments in the following area(s): Balance;Cognition;Endurance;Motor;Pain;Perception;Safety;Sensory;Vision OT Basic ADL's Functional Problem(s): Eating;Grooming;Bathing;Dressing;Toileting;Other (comment) (RUE movement;, vision) OT  Transfers Functional Problem(s): Toilet;Tub/Shower OT Additional Impairment(s): Fuctional Use of Upper Extremity OT Plan OT Intensity: Minimum of 1-2 x/day, 45 to 90 minutes OT Frequency: 5 out of 7 days OT Duration/Estimated Length of Stay: 3 weeks OT Treatment/Interventions: Balance/vestibular training;Cognitive remediation/compensation;Community reintegration;Discharge planning;DME/adaptive equipment instruction;Functional mobility training;Functional electrical stimulation;Neuromuscular re-education;Patient/family education;Self  Care/advanced ADL retraining;Skin care/wound managment;Therapeutic Activities;Therapeutic Exercise;UE/LE Strength taining/ROM;UE/LE Coordination activities;Visual/perceptual remediation/compensation;Wheelchair propulsion/positioning OT Self Feeding Anticipated Outcome(s): supervision OT Basic Self-Care Anticipated Outcome(s): minimal assist OT Toileting Anticipated Outcome(s): minimal assist OT Bathroom Transfers Anticipated Outcome(s): minimal assit OT Recommendation Patient destination: Home (Will need 24/7 supervision) Follow Up Recommendations: Home health OT Equipment Recommended: 3 in 1 bedside comode;Tub/shower bench   Skilled Therapeutic Intervention Brief Interview for Mental Status:    Memory:  Word Repetition:   Recall 1/3 words    Orientation Level:    Year:   No response  Month: No response  Day of Week: no response  Memory:  Recall:    no Recall   Pt.lying in bed.  Two nieces were present.  Pt. Lethargic and unble to answer simple questions.  Sat EOB and pt exhibited poor postural control and balance with max to total assist for balance.  .  Sat pt with HOB at 45 degrees.  He is now on nectar thick liquids, D2 diet.  Pt unable to feed or drink from cup.  Explained to nieces that pt would need 24/7 care after DC.    OT Evaluation Precautions/Restrictions  Precautions Precautions: Fall Restrictions Weight Bearing Restrictions: No     Pain Pain Assessment Pain Assessment: No/denies pain Home Living/Prior Functioning Home Living Available Help at Discharge: Family, Available 24 hours/day (2 children (step children() Type of Home: House Home Access: Stairs to enter [employer] of Steps: 4 up front and ramp at the back of the house Entrance Stairs-Rails: None Home Layout: One level  Lives With: Alone IADL History Homemaking Responsibilities: Yes Meal Prep Responsibility: Primary Laundry Responsibility: Primary Cleaning Responsibility: Primary Bill Paying/Finance Responsibility:  (daughter does) Shopping Responsibility: Secondary Current License: No Mode of Transportation: Family Education:  (can read) Occupation: Retired (retired from [employer] work about 5 years ago) Prior Function Level of Independence: Independent with basic ADLs, Independent with gait  Able to Take Stairs?: Reciprically Driving: No Vocation: Retired Comments: Patient dropped out of school in 7th grade, unable to read,not driving  ADL   Vision/Perception  Vision- Assessment Eye Alignment: Impaired (comment)  Cognition Overall Cognitive Status: Impaired/Different from baseline Arousal/Alertness: Lethargic Orientation Level: Oriented to person Attention: Sustained Sustained Attention: Impaired Sustained Attention Impairment: Verbal basic;Functional basic Memory: Impaired Memory Impairment: Storage deficit;Retrieval deficit;Decreased recall of new information;Decreased short term memory Decreased Short Term Memory: Verbal basic;Functional basic Awareness: Impaired Awareness Impairment: Intellectual impairment Problem Solving: Impaired Problem Solving Impairment: Verbal basic;Functional basic Executive Function: Decision Making;Initiating;Reasoning;Self Monitoring;Self Correcting;Sequencing;Organizing Reasoning: Impaired Reasoning Impairment: Verbal basic;Functional basic Sequencing: Impaired Sequencing Impairment: Verbal  basic;Functional basic Organizing: Impaired Organizing Impairment: Verbal basic;Functional basic Decision Making: Impaired Decision Making Impairment: Verbal basic;Functional basic Initiating: Impaired Initiating Impairment: Verbal basic;Functional basic Self Monitoring: Impaired Self Monitoring Impairment: Verbal basic;Functional basic Self Correcting: Impaired Self Correcting Impairment: Verbal basic;Functional basic Safety/Judgment: Impaired Comments: Patient had overt difficulty sustaining attention.  He required frequent verbal/tactile cues to stay alert to attend to bedside dysphagia evaluation. Sensation Sensation Light Touch: Impaired Detail Light Touch Impaired Details: Absent RUE Proprioception: Impaired by gross assessment Additional Comments:  (Pt unaware of RUE and position) Coordination Gross Motor Movements are Fluid and Coordinated: No Fine Motor Movements are Fluid and Coordinated: No Motor  Motor Motor: Hemiplegia;Abnormal tone;Abnormal postural alignment and control Motor - Skilled Clinical Observations: Decreased awareness and extinction of right side Mobility  Bed Mobility Bed Mobility: Rolling Right;Rolling Left Rolling Right: 3: Mod assist Rolling Right Details: Manual facilitation for placement Rolling Left:  3: Mod assist Transfers Sit to Stand: 2: Max assist Sit to Stand Details: Manual facilitation for weight shifting;Manual facilitation for placement;Tactile cues for posture  Trunk/Postural Assessment  Postural Control Postural Control: Deficits on evaluation Trunk Control: Strong R side lean and push Righting Reactions: Patient unable to right self. Has LOB  Balance Balance Balance Assessed: Yes Static Sitting Balance Static Sitting - Balance Support: Feet supported Static Sitting - Level of Assistance: 2: Max assist Static Sitting - Comment/# of Minutes: 1 minute Dynamic Sitting Balance Sitting balance - Comments: Pushing to R side, R side  inattention Static Standing Balance Static Standing - Level of Assistance: 2: Max assist Extremity/Trunk Assessment RUE Assessment RUE Assessment: Exceptions to [facility] RUE Tone RUE Tone: Hypotonic LUE Assessment LUE Assessment: Within Functional Limits  FIM:  FIM - Eating Eating Activity: 1: Helper feeds patient FIM - Grooming Grooming Steps: Wash, rinse, dry face Grooming: 1: Patient completes 0 of 4 or 1 of 5 steps, or requires 2 helpers FIM - Bathing Bathing: 0: Activity did not occur FIM - Upper Body Dressing/Undressing Upper body dressing/undressing: 0: Activity did not occur FIM - Lower Body Dressing/Undressing Lower body dressing/undressing: 0: Activity did not occur FIM - Toileting Toileting: 0: Activity did not occur FIM - Control and instrumentation engineer Devices: HOB elevated Bed/Chair Transfer: 2: Supine > Sit: Max A (lifting assist/Pt. 25-49%);2: Sit > Supine: Max A (lifting assist/Pt. 25-49%) FIM - Air cabin crew Transfers: 0-Activity did not occur FIM - Tub/Shower Transfers Tub/shower Transfers: 0-Activity did not occur or was simulated   Refer to Care Plan for Long Term Goals  Recommendations for other services: None  Discharge Criteria: Patient will be discharged from OT if patient refuses treatment 3 consecutive times without medical reason, if treatment goals not met, if there is a change in medical status, if patient makes no progress towards goals or if patient is discharged from hospital.  The above assessment, treatment plan, treatment alternatives and goals were discussed and mutually agreed upon: by family  Lisa Roca 07/08/2014, 7:20 PM

## 2014-07-08 NOTE — Progress Notes (Signed)
Patient ID: Marguarite ArbourClyde Woolston, male   DOB: 1945/10/30, 69 y.o.   MRN: [id number]   07/08/14.  69 y/o admit for CIR with functional deficits secondary to left thalamic intracranial hemorrhage secondary to hypertensive crisis  Patient Vitals for the past 24 hrs:  BP Temp Temp src Pulse Resp SpO2 Weight  07/08/14 0523 (!) 162/95 mmHg 98 F (36.7 C) Oral (!) 57 18 97 % -  07/07/14 2038 (!) 174/95 mmHg - - - - - -  07/07/14 1726 (!) 173/97 mmHg 98.7 F (37.1 C) Oral 64 16 100 % 153 lb 14.1 oz (69.8 kg)     Intake/Output Summary (Last 24 hours) at 07/08/14 0849 Last data filed at 07/08/14 0525  Gross per 24 hour  Intake      0 ml  Output    200 ml  Net   -200 ml      Exam-  HENT:  Mild right facial droop  Eyes:  Pupils reactive to light  Neck: Normal range of motion. Neck supple. No thyromegaly present.  Cardiovascular: Normal rate and regular rhythm.  Respiratory: Effort normal and breath sounds normal. No respiratory distress.  GI: Soft. Bowel sounds are normal. He exhibits no distension.  Neurological: He is alert.  Left eye gaze preference. Severe dysarthria but intelligible for most words Right central 7 with dense R HP Extremities-  IVF infusing R arm  Medical Problem List and Plan: 1. Functional deficits secondary to left thalamic intracranial hemorrhage secondary to hypertensive crisis  2. Dysphagia. Dysphagia 3 thin liquids. Monitor for any signs of aspiration. Follow-up speech therapy.IVFs for hydration due to decreased nutritional storage and hydration  3. Hypertension. Lisinopril 20 mg daily. Monitor with increased mobility 4. Tobacco abuse, counseling 5.  Mild hypokalemia- will supplement with Potassium

## 2014-07-08 NOTE — Plan of Care (Signed)
Problem: RH BOWEL ELIMINATION Goal: RH STG MANAGE BOWEL W/MEDICATION W/ASSISTANCE STG Manage Bowel with Medication min. with Assistance.  Outcome: Not Progressing Responds to commands but sleepy  Problem: RH BLADDER ELIMINATION Goal: RH STG MANAGE BLADDER WITH ASSISTANCE STG Manage Bladder With min. Assistance  Outcome: Not Progressing Patient  responds to commands but sleepy

## 2014-07-09 ENCOUNTER — Inpatient Hospital Stay (HOSPITAL_COMMUNITY): Payer: Medicare Other | Admitting: Physical Therapy

## 2014-07-09 LAB — GLUCOSE, CAPILLARY: Glucose-Capillary: 102 mg/dL — ABNORMAL HIGH (ref 65–99)

## 2014-07-09 MED ORDER — PANTOPRAZOLE SODIUM 40 MG PO PACK
40.0000 mg | PACK | Freq: Every day | ORAL | Status: DC
  Filled 2014-07-09 (×2): qty 20

## 2014-07-09 MED ORDER — PANTOPRAZOLE SODIUM 40 MG PO PACK
40.0000 mg | PACK | Freq: Every day | ORAL | Status: DC
  Administered 2014-07-09 – 2014-07-31 (×23): 40 mg via ORAL
  Filled 2014-07-09 (×27): qty 20

## 2014-07-09 NOTE — Progress Notes (Signed)
Patient ID: Marguarite ArbourClyde Gurr, male   DOB: Jul 10, 1945, 69 y.o.   MRN: [id number]   Patient ID: Marguarite ArbourClyde Weinman, male   DOB: Jul 10, 1945, 69 y.o.   MRN: [id number]   07/09/14.  69 y/o admit for CIR with functional deficits secondary to left thalamic intracranial hemorrhage secondary to hypertensive crisis. Restless, seems more alert this am; no voiced complaints.  Patient Vitals for the past 24 hrs:  BP Temp Temp src Pulse Resp SpO2  07/09/14 0558 (!) 164/101 mmHg 98 F (36.7 C) Oral 68 20 100 %  07/08/14 2000 (!) 152/82 mmHg - - - - -  07/08/14 1500 (!) 145/81 mmHg 98.1 F (36.7 C) Oral (!) 58 17 100 %     Intake/Output Summary (Last 24 hours) at 07/09/14 0754 Last data filed at 07/09/14 0600  Gross per 24 hour  Intake    410 ml  Output    600 ml  Net   -190 ml      Exam-  HENT:  Mild right facial droop  Eyes:  Pupils reactive to light  Neck: Normal range of motion. Neck supple. No thyromegaly present.  Cardiovascular: Normal rate and regular rhythm.  Respiratory: Effort normal and breath sounds normal. No respiratory distress.  GI: Soft. Bowel sounds are normal. He exhibits no distension.  Neurological: He is alert.  Left eye gaze preference.  Right central 7 with dense R HP Extremities-  IVF infusing R arm  Medical Problem List and Plan: 1. Functional deficits secondary to left thalamic intracranial hemorrhage secondary to hypertensive crisis  2. Dysphagia. Dysphagia 3 thin liquids. Monitor for any signs of aspiration. Follow-up speech therapy.IVFs for hydration due to decreased nutritional storage and hydration  3. Hypertension. Lisinopril 20 mg daily. Monitor with increased mobility.  BP elevated this am;  May need to intensify treatment if stays elevated 4. Tobacco abuse, counseling 5.  Mild hypokalemia-  supplemented with Potassium yesterday; will check Bmet am

## 2014-07-09 NOTE — Progress Notes (Signed)
Physical Therapy Session Note  Patient Details  Name: Marguarite ArbourClyde Friscia MRN: [id number] Date of Birth: 1945-10-06  Today's Date: 07/09/2014 PT Individual Time: 1255-1355 PT Individual Time Calculation (min): 60 min   Short Term Goals: Week 1:  PT Short Term Goal 1 (Week 1): patient will be able to perform supine to sit transfer with min A. PT Short Term Goal 2 (Week 1): Patient will be able to maintain sitting EOB with  min A and reduced R side push/lean. PT Short Term Goal 3 (Week 1): Patient will be able to perform sit to stand transfer with mod A and maintain standing with mod A for up to 1 min PT Short Term Goal 4 (Week 1): Patient will be able to perform transfer to w/c with mod A PT Short Term Goal 5 (Week 1): Patient will be able to ambulate with LRAD on a distance of 10 feet with mod A  Skilled Therapeutic Interventions/Progress Updates:   Pt with limited proprioceptive awareness leaning to R in standing despite cueing. Pt also with LLE knee flexion noted in transfers limiting ability to accept weight. Pt demonstrates improvement from yesterday with improves sitting balance likely due to improved arousal. Pt would continue to benefit from skilled PT services to increase functional mobility.   Therapy Documentation Precautions:  Precautions Precautions: Fall Restrictions Weight Bearing Restrictions: No Pain: Pain Assessment Pain Assessment: No/denies pain Faces Pain Scale: Hurts whole lot Pain Type: Acute pain Pain Location: Generalized Pain Frequency: Occasional Pain Intervention(s): Medication (See eMAR) Mobility:  D/ Max A with cues for weight shift, sequencing, and technique.  Locomotion : Ambulation Ambulation/Gait Assistance: 2: Max assist  Other Treatments:  Pt educated on rehab plan, safety in mobility, and weight shift. Standing static without AD and with wheelchair anterior and lateral 1'x4. Unsupported sitting as active rest. Pt performs static and dynamic sitting  balance tasks within functional tasks including weight shifting, reaching, and grasping activities. RUE resisted shoulder flexion in gravity eliminated plane.RUE weight bearing in sitting 2'x3 and with weight shifts 2x10. RUE weight bearing with 70 degrees shoulder flexion. BLE advancement pre-gait x10 and x8. Transfers x12 in session.   See FIM for current functional status  Therapy/Group: Individual Therapy  Christia ReadingKinney, Emalia Witkop G 07/09/2014, 1:24 PM

## 2014-07-10 ENCOUNTER — Inpatient Hospital Stay (HOSPITAL_COMMUNITY): Payer: Medicare Other | Admitting: Rehabilitation

## 2014-07-10 ENCOUNTER — Inpatient Hospital Stay (HOSPITAL_COMMUNITY): Payer: Medicare Other | Admitting: Occupational Therapy

## 2014-07-10 ENCOUNTER — Inpatient Hospital Stay (HOSPITAL_COMMUNITY): Payer: Medicare Other | Admitting: Speech Pathology

## 2014-07-10 ENCOUNTER — Inpatient Hospital Stay (HOSPITAL_COMMUNITY): Payer: Medicare Other

## 2014-07-10 DIAGNOSIS — G8190 Hemiplegia, unspecified affecting unspecified side: Secondary | ICD-10-CM

## 2014-07-10 DIAGNOSIS — R209 Unspecified disturbances of skin sensation: Secondary | ICD-10-CM

## 2014-07-10 DIAGNOSIS — I611 Nontraumatic intracerebral hemorrhage in hemisphere, cortical: Secondary | ICD-10-CM

## 2014-07-10 DIAGNOSIS — R208 Other disturbances of skin sensation: Secondary | ICD-10-CM

## 2014-07-10 DIAGNOSIS — I69398 Other sequelae of cerebral infarction: Secondary | ICD-10-CM

## 2014-07-10 DIAGNOSIS — I69898 Other sequelae of other cerebrovascular disease: Secondary | ICD-10-CM

## 2014-07-10 LAB — COMPREHENSIVE METABOLIC PANEL
ALBUMIN: 4 g/dL (ref 3.5–5.0)
ALK PHOS: 64 U/L (ref 38–126)
ALT: 19 U/L (ref 17–63)
AST: 25 U/L (ref 15–41)
Anion gap: 11 (ref 5–15)
BUN: 10 mg/dL (ref 6–20)
CALCIUM: 9.5 mg/dL (ref 8.9–10.3)
CO2: 26 mmol/L (ref 22–32)
CREATININE: 1.11 mg/dL (ref 0.61–1.24)
Chloride: 98 mmol/L — ABNORMAL LOW (ref 101–111)
GFR calc Af Amer: >60 mL/min (ref >60)
Glucose, Bld: 93 mg/dL (ref 65–99)
Potassium: 3.5 mmol/L (ref 3.5–5.1)
Sodium: 135 mmol/L (ref 135–145)
Total Bilirubin: 0.8 mg/dL (ref 0.3–1.2)
Total Protein: 8.2 g/dL — ABNORMAL HIGH (ref 6.5–8.1)

## 2014-07-10 LAB — CBC WITH DIFFERENTIAL/PLATELET
Basophils Absolute: 0 10*3/uL (ref 0.0–0.1)
Basophils Relative: 0 % (ref 0–1)
Eosinophils Absolute: 0.2 10*3/uL (ref 0.0–0.7)
Eosinophils Relative: 2 % (ref 0–5)
HCT: 40.7 % (ref 39.0–52.0)
Hemoglobin: 13.7 g/dL (ref 13.0–17.0)
LYMPHS PCT: 16 % (ref 12–46)
Lymphs Abs: 1.2 10*3/uL (ref 0.7–4.0)
MCH: 30 pg (ref 26.0–34.0)
MCHC: 33.7 g/dL (ref 30.0–36.0)
MCV: 89.3 fL (ref 78.0–100.0)
MONO ABS: 0.2 10*3/uL (ref 0.1–1.0)
Monocytes Relative: 2 % — ABNORMAL LOW (ref 3–12)
NEUTROS PCT: 80 % — AB (ref 43–77)
Neutro Abs: 6 10*3/uL (ref 1.7–7.7)
Platelets: 168 10*3/uL (ref 150–400)
RBC: 4.56 MIL/uL (ref 4.22–5.81)
RDW: 13.3 % (ref 11.5–15.5)
WBC: 7.5 10*3/uL (ref 4.0–10.5)

## 2014-07-10 LAB — GLUCOSE, CAPILLARY: GLUCOSE-CAPILLARY: 89 mg/dL (ref 65–99)

## 2014-07-10 NOTE — Progress Notes (Signed)
69 y.o. right handed male with history of tobacco abuse, hypertension on no prescription medications. Patient lives alone independent prior to admission. Presented 07/04/2014 with right-sided weakness and altered mental status. CT the head showed left ICH with intraventricular extension. Blood pressure 173/103. Carotid Doppler showed no ICA stenosis. Echocardiogram with ejection fraction of 60% no wall motion abnormalities. Follow-up cranial CT scan 07/05/2014 showed evolving left thalamic 3.3 x 2.3 cm hematoma with 4 mm left-to-right midline shift mild hydrocephalus. Placed on intravenous labetalol for blood pressure control. Subcutaneous heparin initiated for DVT prophylaxis 07/06/2014. On 07/07/2014 question somnolence altered mental status with again cranial CT scan completed showing no significant change in therapy is resumed Subjective/Complaints: Pt sleeping but awakens to voice, no pain c/os "I know where I am but can't call it"  Review of Systems - limited due to mental status  Objective: Vital Signs: Blood pressure 150/80, pulse 71, temperature 98.1 F (36.7 C), temperature source Oral, resp. rate 20, weight 69.8 kg (153 lb 14.1 oz), SpO2 100 %. No results found. Results for orders placed or performed during the hospital encounter of 07/07/14 (from the past 72 hour(s))  Glucose, capillary     Status: Abnormal   Collection Time: 07/09/14  6:58 AM  Result Value Ref Range   Glucose-Capillary 102 (H) 65 - 99 mg/dL  CBC WITH DIFFERENTIAL     Status: Abnormal   Collection Time: 07/10/14  6:05 AM  Result Value Ref Range   WBC 7.5 4.0 - 10.5 K/uL   RBC 4.56 4.22 - 5.81 MIL/uL   Hemoglobin 13.7 13.0 - 17.0 g/dL   HCT 40.7 39.0 - 52.0 %   MCV 89.3 78.0 - 100.0 fL   MCH 30.0 26.0 - 34.0 pg   MCHC 33.7 30.0 - 36.0 g/dL   RDW 13.3 11.5 - 15.5 %   Platelets 168 150 - 400 K/uL   Neutrophils Relative % 80 (H) 43 - 77 %   Neutro Abs 6.0 1.7 - 7.7 K/uL   Lymphocytes Relative 16 12 - 46 %   Lymphs Abs 1.2 0.7 - 4.0 K/uL   Monocytes Relative 2 (L) 3 - 12 %   Monocytes Absolute 0.2 0.1 - 1.0 K/uL   Eosinophils Relative 2 0 - 5 %   Eosinophils Absolute 0.2 0.0 - 0.7 K/uL   Basophils Relative 0 0 - 1 %   Basophils Absolute 0.0 0.0 - 0.1 K/uL  Comprehensive metabolic panel     Status: Abnormal   Collection Time: 07/10/14  6:05 AM  Result Value Ref Range   Sodium 135 135 - 145 mmol/L   Potassium 3.5 3.5 - 5.1 mmol/L   Chloride 98 (L) 101 - 111 mmol/L   CO2 26 22 - 32 mmol/L   Glucose, Bld 93 65 - 99 mg/dL   BUN 10 6 - 20 mg/dL   Creatinine, Ser 1.11 0.61 - 1.24 mg/dL   Calcium 9.5 8.9 - 10.3 mg/dL   Total Protein 8.2 (H) 6.5 - 8.1 g/dL   Albumin 4.0 3.5 - 5.0 g/dL   AST 25 15 - 41 U/L   ALT 19 17 - 63 U/L   Alkaline Phosphatase 64 38 - 126 U/L   Total Bilirubin 0.8 0.3 - 1.2 mg/dL   GFR calc non Af Amer >60 >60 mL/min   GFR calc Af Amer >60 >60 mL/min    Comment: (NOTE) The eGFR has been calculated using the CKD EPI equation. This calculation has not been validated in all clinical  situations. eGFR's persistently <60 mL/min signify possible Chronic Kidney Disease.    Anion gap 11 5 - 15     HEENT: normal Cardio: RRR and no murmur Resp: CTA B/L and unlabored GI: BS positive and NT, ND Extremity:  Pulses positive and No Edema Skin:   Intact Neuro: Lethargic, Confused, Abnormal Sensory reduced sensation to pinch in RUE and RLE, Abnormal Motor trace elbow ext on R UE, 2- Hip/knee ext synergy on R side and Dysarthric Musc/Skel:  Other no pain with AROM of limbs Gen NAD   Assessment/Plan: 1. 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 Physiatrist is providing close team supervision and 24 hour management of active medical problems listed below. Physiatrist and  rehab team continue to assess barriers to discharge/monitor patient progress toward functional and medical goals. FIM: FIM - Bathing Bathing: 0: Activity did not occur  FIM - Upper Body Dressing/Undressing Upper body dressing/undressing: 0: Activity did not occur FIM - Lower Body Dressing/Undressing Lower body dressing/undressing: 0: Activity did not occur  FIM - Toileting Toileting: 0: Activity did not occur  FIM - Air cabin crew Transfers: 0-Activity did not occur  FIM - Control and instrumentation engineer Devices: HOB elevated Bed/Chair Transfer: 2: Supine > Sit: Max A (lifting assist/Pt. 25-49%), 2: Bed > Chair or W/C: Max A (lift and lower assist), 2: Chair or W/C > Bed: Max A (lift and lower assist)  FIM - Locomotion: Ambulation Ambulation/Gait Assistance: 2: Max assist Locomotion: Ambulation: 1: Two helpers  Comprehension Comprehension Mode: Auditory Comprehension: 3-Understands basic 50 - 74% of the time/requires cueing 25 - 50%  of the time  Expression Expression Mode: Verbal Expression: 2-Expresses basic 25 - 49% of the time/requires cueing 50 - 75% of the time. Uses single words/gestures.  Social Interaction Social Interaction: 2-Interacts appropriately 25 - 49% of time - Needs frequent redirection.  Problem Solving Problem Solving: 1-Solves basic less than 25% of the time - needs direction nearly all the time or does not effectively solve problems and may need a restraint for safety  Memory Memory: 1-Recognizes or recalls less than 25% of the time/requires cueing greater than 75% of the time   Medical Problem List and Plan: 1. 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 DVT Prophylaxis/Anticoagulation: Subcutaneous heparin for DVT prophylaxis initiated 07/06/2014 3. Pain Management: Tylenol as needed 4. Dysphagia. Dysphagia 3 thin liquids. Monitor for any signs of aspiration.  Follow-up speech therapy.IVFs for hydration due to decreased nutritional storage and hydration 5. Neuropsych: This patient is capable of making decisions on his own behalf. 6. Skin/Wound Care: Routine skin checks 7. Fluids/Electrolytes/Nutrition: Routine I nose with follow-up chemistries 8. Hypertension. Lisinopril 20 mg daily. Monitor with increased mobility 9. Tobacco abuse, counseling  LOS (Days) 3 A FACE TO FACE EVALUATION WAS PERFORMED  KIRSTEINS,ANDREW E 07/10/2014, 8:42 AM

## 2014-07-10 NOTE — Progress Notes (Signed)
Patient information reviewed and entered into eRehab system by Ryer Asato, RN, CRRN, PPS Coordinator.  Information including medical coding and functional independence measure will be reviewed and updated through discharge.     Per nursing patient was given "Data Collection Information Summary for Patients in Inpatient Rehabilitation Facilities with attached "Privacy Act Statement-Health Care Records" upon admission.  

## 2014-07-10 NOTE — Progress Notes (Addendum)
Speech Language Pathology Daily Session Note  Patient Details  Name: Michael Goodwin MRN: [id number] Date of Birth: 04/10/1945  Today's Date: 07/10/2014 SLP Individual Time: 1500-1526 SLP Individual Time Calculation (min): 26 min  Short Term Goals: Week 1: SLP Short Term Goal 1 (Week 1): Pt will tolerate dysphagia 2 with nectar thick liquids using swallow strategies with min A given verbal/visual/tactile cueing. SLP Short Term Goal 2 (Week 1): Pt will sustain attention to functional tasks for at least 5 minutes with mod A given multimodal cueing. SLP Short Term Goal 3 (Week 1): Pt will be oriented x4 with visual external aids with max A given multimodal cueing. SLP Short Term Goal 4 (Week 1): Pt will recall biographical information with mod A given multimodal cueing. SLP Short Term Goal 5 (Week 1): Pt will demonstrate basic problem solving in functional tasks with max A given multimodal cueing.  Skilled Therapeutic Interventions:  Pt was seen for skilled ST targeting goals for dysphagia and cognition.  Upon arrival, pt was asleep in bed but was awakened to voice and light touch.  Pt was oriented to self only independently but was reoriented to place with mod assist verbal cues.  He was total assist to orient to situation. Pt required +2 to be repositioned to sitting as upright as possible in bed due to difficulty following directions and sequencing bed mobility.  Once seated upright, pt was able to maintain alertness and sustain attention to structured tasks for 30 second-1 minute intervals.  SLP facilitated the session with trials of ice chips and teaspoons of regular water to continue working towards diet progression.  Prior to trials, pt required max to total assist to complete oral care due to decreased sequencing and initiation.  SLP followed behind pt for thoroughness.  Pt consumed 5 trials of ice chips and 5 trials of regular water via teaspoons and presented with a suspected delayed swallow  initiation with no overt s/s of aspiration.  Given the extent of pt's neuro deficits and lethargy, do not recommend diet advancement to thin liquids at this point. SLP will continue to follow up for readiness for diet progression.     FIM:  Comprehension Comprehension Mode: Auditory Comprehension: 3-Understands basic 50 - 74% of the time/requires cueing 25 - 50%  of the time Expression Expression Mode: Verbal Expression: 3-Expresses basic 50 - 74% of the time/requires cueing 25 - 50% of the time. Needs to repeat parts of sentences. Social Interaction Social Interaction: 2-Interacts appropriately 25 - 49% of time - Needs frequent redirection. Problem Solving Problem Solving: 2-Solves basic 25 - 49% of the time - needs direction more than half the time to initiate, plan or complete simple activities Memory Memory: 1-Recognizes or recalls less than 25% of the time/requires cueing greater than 75% of the time FIM - Eating Eating Activity: 1: Helper feeds patient (with trials)  Pain Pain Assessment Pain Assessment: No/denies pain Faces Pain Scale: No hurt  Therapy/Group: Individual Therapy  Astella Desir, Melanee SpryNicole L 07/10/2014, 3:54 PM

## 2014-07-10 NOTE — IPOC Note (Addendum)
Overall Plan of Care [facility]) Patient Details Name: Michael Goodwin MRN: [id number] DOB: 04-23-1945  Admitting Diagnosis: CVA  Hospital Problems: Principal Problem:   Nontraumatic thalamic hemorrhage Active Problems:   Hemiplegia affecting right dominant side   Cognitive deficit due to recent stroke   Alterations of sensations following CVA (cerebrovascular accident)     Functional Problem List: Nursing Behavior, Bladder, Bowel, Edema, Endurance, Medication Management, Nutrition, Pain, Perception, Safety, Sensory, Skin Integrity  PT Balance, Endurance, Motor, Sensory, Safety  OT Balance, Cognition, Endurance, Motor, Pain, Perception, Safety, Sensory, Vision  SLP Cognition, Endurance, Nutrition, Safety  TR         Basic ADL's: OT Eating, Grooming, Bathing, Dressing, Toileting, Other (comment) (RUE movement;, vision)     Advanced  ADL's: OT       Transfers: PT Bed Mobility, Bed to Chair  OT Toilet, Tub/Shower     Locomotion: PT Ambulation, Wheelchair Mobility, Stairs     Additional Impairments: OT Fuctional Use of Upper Extremity  SLP Communication comprehension, expression    TR      Anticipated Outcomes Item Anticipated Outcome  Self Feeding supervision  Swallowing  Pt will tolerate dysphagia 3 diet with thin liquids with supervision.   Basic self-care  minimal assist  Toileting  minimal assist   Bathroom Transfers minimal assit  Bowel/Bladder  min assist  Transfers  Min A for transfers  Locomotion  Min A for ambulation with AD,  Communication  To be determine; at this current time, patient's decreased level of alertness interfered with assessing communication skills.  Cognition  Pt will display intellectual awareness, basic problem solving, recall of ST memory, and sustatined attention with min A   Pain  <3  Safety/Judgment  mod assist   Therapy Plan: PT Intensity: Minimum of 1-2 x/day ,45 to 90 minutes PT Frequency: 5 out of 7 days PT Duration  Estimated Length of Stay: 2 to 2.5 weeks OT Intensity: Minimum of 1-2 x/day, 45 to 90 minutes OT Frequency: 5 out of 7 days OT Duration/Estimated Length of Stay: 2.5 to 3 weeks SLP Intensity: Minumum of 1-2 x/day, 30 to 90 minutes SLP Frequency: 3 to 5 out of 7 days SLP Duration/Estimated Length of Stay: 18-21 days       Team Interventions: Nursing Interventions Patient/Family Education, Bladder Management, Bowel Management, Pain Management, Disease Management/Prevention, Medication Management, Skin Care/Wound Management, Cognitive Remediation/Compensation, Dysphagia/Aspiration Precaution Training, Discharge Planning, Psychosocial Support  PT interventions Ambulation/gait training, Neuromuscular re-education, Patient/family education, Stair training, Therapeutic Exercise, UE/LE Coordination activities, Wheelchair propulsion/positioning, UE/LE Strength taining/ROM, Functional mobility training  OT Interventions Balance/vestibular training, Cognitive remediation/compensation, Community reintegration, Discharge planning, DME/adaptive equipment instruction, Functional mobility training, Functional electrical stimulation, Neuromuscular re-education, Patient/family education, Self Care/advanced ADL retraining, Skin care/wound managment, Therapeutic Activities, Therapeutic Exercise, UE/LE Strength taining/ROM, UE/LE Coordination activities, Visual/perceptual remediation/compensation, Wheelchair propulsion/positioning  SLP Interventions Cognitive remediation/compensation, Cueing hierarchy, Dysphagia/aspiration precaution training, Functional tasks, Environmental controls, Internal/external aids, Medication managment, Multimodal communication approach, Patient/family education, Speech/Language facilitation, Oral motor exercises  TR Interventions    SW/CM Interventions Discharge Planning, Psychosocial Support, Patient/Family Education    Team Discharge Planning: Destination: PT-Home ,OT- Home (Will need 24/7  supervision) , SLP-Skilled Nursing Facility (SNF) Projected Follow-up: PT-24 hour supervision/assistance, Skilled nursing facility, OT-  Home health OT, SLP-Skilled Nursing facility Projected Equipment Needs: PT-To be determined, OT- 3 in 1 bedside comode, Tub/shower bench, SLP-None recommended by SLP Equipment Details: PT- , OT-  Patient/family involved in discharge planning: PT- Patient,  OT-Family member/caregiver, SLP-Patient, Patient unable/family or  caregive not available  MD ELOS: 18-21d Medical Rehab Prognosis:  Good Assessment: 69 y.o. right handed male with history of tobacco abuse, hypertension on no prescription medications. Patient lives alone independent prior to admission. Presented 07/04/2014 with right-sided weakness and altered mental status. CT the head showed left ICH with intraventricular extension. Blood pressure 173/103. Carotid Doppler showed no ICA stenosis. Echocardiogram with ejection fraction of 60% no wall motion abnormalities. Follow-up cranial CT scan 07/05/2014 showed evolving left thalamic 3.3 x 2.3 cm hematoma with 4 mm left-to-right midline shift mild hydrocephalus   Now requiring 24/7 Rehab RN,MD, as well as CIR level PT, OT and SLP.  Treatment team will focus on ADLs and mobility with [facility]   See Team Conference Notes for weekly updates to the plan of care

## 2014-07-10 NOTE — Progress Notes (Signed)
Occupational Therapy Session Note  Patient Details  Name: Michael Goodwin MRN: [id number] Date of Birth: 03/01/1945  Today's Date: 07/10/2014 OT Individual Time: [id number] OT Individual Time Calculation (min): 54 min  Missed 6 minutes of OT secondary to fatigue   Short Term Goals: Week 1:  OT Short Term Goal 1 (Week 1): pt. will feed self with minimal assist with LUE OT Short Term Goal 2 (Week 1): Pt will maintain static sitting balance for 10 minutes unsupported OT Short Term Goal 3 (Week 1): Pt. will groom self with mod assist OT Short Term Goal 4 (Week 1): Pt. will bathe self with max assist OT Short Term Goal 5 (Week 1): [facility]  Skilled Therapeutic Interventions/Progress Updates:  Patient received seated in w/c with family present (daughter and 3 grandchildren). Pt with increased fatigue throughout session, difficult time keeping eyes open and head up. Therapist assisted pt > sink for ADL retraining at sink level. Total multimodal cues required for initiation, problem solving, staying on task, postural control/staying at midline. Pt overall total assist for ADL. Used STEDY for sit<>stands and to maintain standing for peri cleansing. +2 needed for sit<>stands and all tasks in standing due to increased right lean (pusher). STEDY is not the best/safest option at this time. At this time, recommend ADL be performed at bed level and squat pivots for functional transfers. Due to fatigue, assisted pt back to bed to don brief. Educated pt's daughter on RUE PROM exercises, not going past 90* for shoulder flexion or shoulder abduction. Also educated daughter on elbow flexion/extension, supination/pronation, wrist flexion/extension, and finger flexion/extension. Pt falling asleep and unable to attend to any task, therefore missed 6 minutes of this therapy session. Left pt supine in bed with all needs within reach and bed alarm set.   Therapy Documentation Precautions:   Precautions Precautions: Fall Restrictions Weight Bearing Restrictions: No  See FIM for current functional status  Therapy/Group: Individual Therapy  Labradford Schnitker , MS, OTR/L, CLT Pager: [phone]   07/10/2014, 12:15 PM

## 2014-07-10 NOTE — Progress Notes (Signed)
Physical Therapy Session Note  Patient Details  Name: Marguarite ArbourClyde Westerlund MRN: [id number] Date of Birth: [birth year]  Today's Date: 07/10/2014 PT Individual Time: 0945-1100 PT Individual Time Calculation (min): 75 min   Short Term Goals: Week 1:  PT Short Term Goal 1 (Week 1): patient will be able to perform supine to sit transfer with min A. PT Short Term Goal 2 (Week 1): Patient will be able to maintain sitting EOB with  min A and reduced R side push/lean. PT Short Term Goal 3 (Week 1): Patient will be able to perform sit to stand transfer with mod A and maintain standing with mod A for up to 1 min PT Short Term Goal 4 (Week 1): Patient will be able to perform transfer to w/c with mod A PT Short Term Goal 5 (Week 1): Patient will be able to ambulate with LRAD on a distance of 10 feet with mod A  Skilled Therapeutic Interventions/Progress Updates:   Pt received lying in bed, nursing in room to provide meds, therefore assisted pt into sitting at EOB to increase arousal and improve posture for safe swallowing.  Performed bed mobility at max A level with assist and max cues for LE placement, rolling and to weight shift trunk into sitting.  Once at EOB, requires min to max A for sitting balance with facilitation for upright posture and anterior pelvic tilt.  Max verbal cues to open eyes throughout session.  Transferred to w/c via squat pivot at +2A level due to poor initiation, decreased arousal and decreased ability to forward weight shift to elevate buttocks.  Assisted to therapy gym at total A level in order to work on dynamic sitting balance, postural control, moving COG around BOS for increased pelvic movement and to determine amount of pushing, dynamic standing with sustained reaching upward and to the L to decrease pushing and promote upright posture and ending session with gait via "three musketeer style" x 35' with heavy assist and facilitation for upright posture, forward weight shift over RLE,  approximation through R knee to prevent buckle and constant cues for increased stride length.  Pt initially very unsteady and tended to bend both knees at the same time, however with repetition improved performance somewhat.  Assisted back into w/c.  Daughter present at end of session, therefore assisted pt into day room to facilitate any active movement in RUE and attention to the R, while discussing ELOS, prognosis, barriers to improvement and timeline for recovery.  Pts daughter verbalized understanding.  Left in w/c in room with quick release belt donned and OT at door for next session.   Therapy Documentation Precautions:  Precautions Precautions: Fall Restrictions Weight Bearing Restrictions: No   Vital Signs: Therapy Vitals Temp: 98.1 F (36.7 C) Temp Source: Oral Pulse Rate: 71 BP: (!) 150/80 mmHg Patient Position (if appropriate): Lying Oxygen Therapy SpO2: 100 % O2 Device: Not Delivered Pain: No complaints of pain during session.    See FIM for current functional status  Therapy/Group: Individual Therapy  Vista Deckarcell, Niaya Hickok Ann 07/10/2014, 8:26 AM

## 2014-07-10 NOTE — Progress Notes (Signed)
Occupational Therapy Note  Patient Details  Name: Michael Goodwin MRN: [id number] Date of Birth: 01-26-1946  Today's Date: 07/10/2014 OT Individual Time: 1330-1400 OT Individual Time Calculation (min): 30 min   Pt denied pain Individual therapy  Pt asleep upon arrival.  Pt required mod multimodal cues to arouse but was unable to keep eyes open for more than 10 seconds each occurrence.  Attempted to sit EOB. Pt required max A and max multimodal cues for sitting balance.  Pt exhibited significant pushing to right.  Pt not oriented to place, situation, or date.  Focus on active participation, activity tolerance, bed mobility, sitting balance, and orientation.  Pt returned to supine with bed alarm on.   Lavone NeriLanier, Claritza July [facility] CenterChappell 07/10/2014, 3:03 PM

## 2014-07-11 ENCOUNTER — Inpatient Hospital Stay (HOSPITAL_COMMUNITY): Payer: Medicare Other | Admitting: Occupational Therapy

## 2014-07-11 ENCOUNTER — Inpatient Hospital Stay (HOSPITAL_COMMUNITY): Payer: Medicare Other

## 2014-07-11 ENCOUNTER — Ambulatory Visit (HOSPITAL_COMMUNITY): Payer: Medicare Other | Admitting: Speech Pathology

## 2014-07-11 ENCOUNTER — Inpatient Hospital Stay (HOSPITAL_COMMUNITY): Payer: Medicare Other | Admitting: *Deleted

## 2014-07-11 ENCOUNTER — Inpatient Hospital Stay (HOSPITAL_COMMUNITY): Payer: Medicare Other | Admitting: Rehabilitation

## 2014-07-11 DIAGNOSIS — G8191 Hemiplegia, unspecified affecting right dominant side: Secondary | ICD-10-CM

## 2014-07-11 DIAGNOSIS — I619 Nontraumatic intracerebral hemorrhage, unspecified: Secondary | ICD-10-CM

## 2014-07-11 DIAGNOSIS — I6981 Cognitive deficits following other cerebrovascular disease: Secondary | ICD-10-CM

## 2014-07-11 LAB — URINALYSIS, ROUTINE W REFLEX MICROSCOPIC
GLUCOSE, UA: NEGATIVE mg/dL
Ketones, ur: 15 mg/dL — AB
Nitrite: NEGATIVE
PROTEIN: NEGATIVE mg/dL
Specific Gravity, Urine: 1.015 (ref 1.005–1.030)
UROBILINOGEN UA: 1 mg/dL (ref 0.0–1.0)
pH: 5 (ref 5.0–8.0)

## 2014-07-11 LAB — URINE MICROSCOPIC-ADD ON

## 2014-07-11 MED ORDER — CIPROFLOXACIN IN D5W 200 MG/100ML IV SOLN
200.0000 mg | Freq: Two times a day (BID) | INTRAVENOUS | Status: DC
  Administered 2014-07-11 – 2014-07-13 (×4): 200 mg via INTRAVENOUS
  Filled 2014-07-11 (×5): qty 100

## 2014-07-11 MED ORDER — CEPHALEXIN 250 MG PO CAPS
250.0000 mg | ORAL_CAPSULE | Freq: Three times a day (TID) | ORAL | Status: DC
  Filled 2014-07-11 (×4): qty 1

## 2014-07-11 NOTE — Progress Notes (Signed)
69 y.o. right handed male with history of tobacco abuse, hypertension on no prescription medications. Patient lives alone independent prior to admission. Presented 07/04/2014 with right-sided weakness and altered mental status. CT the head showed left ICH with intraventricular extension. Blood pressure 173/103. Carotid Doppler showed no ICA stenosis. Echocardiogram with ejection fraction of 60% no wall motion abnormalities. Follow-up cranial CT scan 07/05/2014 showed evolving left thalamic 3.3 x 2.3 cm hematoma with 4 mm left-to-right midline shift mild hydrocephalus. Placed on intravenous labetalol for blood pressure control. Subcutaneous heparin initiated for DVT prophylaxis 07/06/2014. On 07/07/2014 question somnolence altered mental status with again cranial CT scan completed showing no significant change in therapy is resumed Subjective/Complaints: Remains lethargic and confused, speech will downgrade diet Discussed no MBS for a couple days because of fever Pt without specific c/os but cognition is limited  Review of Systems - limited due to mental status  Objective: Vital Signs: Blood pressure 142/89, pulse 89, temperature 100.3 F (37.9 C), temperature source Oral, resp. rate 18, weight 68.6 kg (151 lb 3.8 oz), SpO2 100 %. No results found. Results for orders placed or performed during the hospital encounter of 07/07/14 (from the past 72 hour(s))  Glucose, capillary     Status: Abnormal   Collection Time: 07/09/14  6:58 AM  Result Value Ref Range   Glucose-Capillary 102 (H) 65 - 99 mg/dL  CBC WITH DIFFERENTIAL     Status: Abnormal   Collection Time: 07/10/14  6:05 AM  Result Value Ref Range   WBC 7.5 4.0 - 10.5 K/uL   RBC 4.56 4.22 - 5.81 MIL/uL   Hemoglobin 13.7 13.0 - 17.0 g/dL   HCT 40.7 39.0 - 52.0 %   MCV 89.3 78.0 - 100.0 fL   MCH 30.0 26.0 - 34.0 pg   MCHC 33.7 30.0 - 36.0 g/dL   RDW 13.3 11.5 - 15.5 %   Platelets 168 150 - 400 K/uL   Neutrophils Relative % 80 (H) 43 -  77 %   Neutro Abs 6.0 1.7 - 7.7 K/uL   Lymphocytes Relative 16 12 - 46 %   Lymphs Abs 1.2 0.7 - 4.0 K/uL   Monocytes Relative 2 (L) 3 - 12 %   Monocytes Absolute 0.2 0.1 - 1.0 K/uL   Eosinophils Relative 2 0 - 5 %   Eosinophils Absolute 0.2 0.0 - 0.7 K/uL   Basophils Relative 0 0 - 1 %   Basophils Absolute 0.0 0.0 - 0.1 K/uL  Comprehensive metabolic panel     Status: Abnormal   Collection Time: 07/10/14  6:05 AM  Result Value Ref Range   Sodium 135 135 - 145 mmol/L   Potassium 3.5 3.5 - 5.1 mmol/L   Chloride 98 (L) 101 - 111 mmol/L   CO2 26 22 - 32 mmol/L   Glucose, Bld 93 65 - 99 mg/dL   BUN 10 6 - 20 mg/dL   Creatinine, Ser 1.11 0.61 - 1.24 mg/dL   Calcium 9.5 8.9 - 10.3 mg/dL   Total Protein 8.2 (H) 6.5 - 8.1 g/dL   Albumin 4.0 3.5 - 5.0 g/dL   AST 25 15 - 41 U/L   ALT 19 17 - 63 U/L   Alkaline Phosphatase 64 38 - 126 U/L   Total Bilirubin 0.8 0.3 - 1.2 mg/dL   GFR calc non Af Amer >60 >60 mL/min   GFR calc Af Amer >60 >60 mL/min    Comment: (NOTE) The eGFR has been calculated using the CKD EPI equation.  This calculation has not been validated in all clinical situations. eGFR's persistently <60 mL/min signify possible Chronic Kidney Disease.    Anion gap 11 5 - 15  Glucose, capillary     Status: None   Collection Time: 07/10/14  4:23 PM  Result Value Ref Range   Glucose-Capillary 89 65 - 99 mg/dL   Comment 1 Notify RN      HEENT: normal Cardio: RRR and no murmur Resp: CTA B/L and unlabored GI: BS positive and NT, ND Extremity:  Pulses positive and No Edema Skin:   Intact Neuro: Lethargic, Confused, Abnormal Sensory reduced sensation to pinch in RUE and RLE, Abnormal Motor trace elbow ext on R UE, 2- Hip/knee ext synergy on R side and Dysarthric Musc/Skel:  Other no pain with AROM of limbs Gen NAD   Assessment/Plan: 1. 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 Physiatrist is providing close team supervision and 24 hour management of active medical problems listed below. Physiatrist and rehab team continue to assess barriers to discharge/monitor patient progress toward functional and medical goals. FIM: FIM - Bathing Bathing: 1: Two helpers  FIM - Upper Body Dressing/Undressing Upper body dressing/undressing: 0: Wears gown/pajamas-no public clothing FIM - Lower Body Dressing/Undressing Lower body dressing/undressing: 0: Wears Interior and spatial designer  FIM - Toileting Toileting: 0: No continent bowel/bladder events this shift  FIM - Air cabin crew Transfers: 0-Activity did not occur  FIM - Control and instrumentation engineer Devices: HOB elevated Bed/Chair Transfer: 2: Supine > Sit: Max A (lifting assist/Pt. 25-49%), 1: Two helpers  FIM - Locomotion: Wheelchair Locomotion: Wheelchair: 0: Activity did not occur FIM - Locomotion: Ambulation Locomotion: Ambulation Assistive Devices: Other (comment) ("three musketeer style) Ambulation/Gait Assistance: 1: +2 Total assist Locomotion: Ambulation: 1: Two helpers  Comprehension Comprehension Mode: Auditory Comprehension: 3-Understands basic 50 - 74% of the time/requires cueing 25 - 50%  of the time  Expression Expression Mode: Verbal Expression: 3-Expresses basic 50 - 74% of the time/requires cueing 25 - 50% of the time. Needs to repeat parts of sentences.  Social Interaction Social Interaction: 2-Interacts appropriately 25 - 49% of time - Needs frequent redirection.  Problem Solving Problem Solving: 2-Solves basic 25 - 49% of the time - needs direction more than half the time to initiate, plan or complete simple activities  Memory Memory: 1-Recognizes or recalls less than 25% of the time/requires cueing greater than 75% of the time   Medical  Problem List and Plan: 1. 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 DVT Prophylaxis/Anticoagulation: Subcutaneous heparin for DVT prophylaxis initiated 07/06/2014 3. Pain Management: Tylenol as needed 4. Dysphagia. Dysphagia 3 thin liquids. Monitor for any signs of aspiration. Follow-up speech therapy.IVFs for hydration due to decreased nutritional storage and hydration 5. Neuropsych: This patient is capable of making decisions on his own behalf. 6. Skin/Wound Care: Routine skin checks 7. Fluids/Electrolytes/Nutrition: Routine I nose with follow-up chemistries 8. Hypertension. Lisinopril 20 mg daily. Monitor with increased mobility 9. Tobacco abuse, counseling 10.  Elevated temp- ?UTI, check UA , BBC x 2 from 5/27 pending , WBC yesrterday were normal, CXR 5/27 without infiltrate LOS (Days) 4 A FACE TO FACE EVALUATION WAS PERFORMED  Thena Devora E 07/11/2014, 8:41 AM

## 2014-07-11 NOTE — Progress Notes (Signed)
Social Work Patient ID: Michael Goodwin, male   DOB: [birth year], 69 y.o.   MRN: [id number]  CSW attempted to meet with pt, but he is lethargic and confused at this time.  CSW spoke with pt's nurse, Morrie SheldonAshley, and she informed CSW that they are checking pt for a UTI.  RN will inform CSW if pt's family visits during the day so that CSW can meet them and complete assessment.  CSW will reach out to family via telephone if they do not visit.

## 2014-07-11 NOTE — Progress Notes (Signed)
Occupational Therapy Session Note  Patient Details  Name: Marguarite ArbourClyde Mangiapane MRN: [id number] Date of Birth: [birth year]  Today's Date: 07/11/2014 OT Individual Time: 0900-1000 OT Individual Time Calculation (min): 60 min    Short Term Goals: Week 1:  OT Short Term Goal 1 (Week 1): pt. will feed self with minimal assist with LUE OT Short Term Goal 2 (Week 1): Pt will maintain static sitting balance for 10 minutes unsupported OT Short Term Goal 3 (Week 1): Pt. will groom self with mod assist OT Short Term Goal 4 (Week 1): Pt. will bathe self with max assist OT Short Term Goal 5 (Week 1): pt. [facility] with max assist  Skilled Therapeutic Interventions/Progress Updates:    Pt engaged in BADL retraining including bathing with sit<>stand from w/c at sink.  Pt does not currently have any clothing.  Pt required max multimodal cues to keep eyes open and actively engage in therapy.  Pt required tot A for orientation to place, situation, and date. Pt required max multimodal cues to initiate and complete tasks.  Pt required max A for sit<>stand and standing balance while 2nd person performed pericare and pulled up brief.  Pt noted with minimal pushing to right while standing and shifted weight to left with mod multimodal cues.  Pt noted with lean to left while seated in w/c.  Pt remained in w/c at nurses station at end of session.  Focus on arousal, orientation, task initiation, sequencing, sit<>stand, standing balance, attention to right, and safety awareness.  Therapy Documentation Precautions:  Precautions Precautions: Fall Restrictions Weight Bearing Restrictions: No  Pain: Pain Assessment Pain Assessment: No/denies pain Faces Pain Scale: No hurt  See FIM for current functional status  Therapy/Group: Individual Therapy  Rich BraveLanier, Rashan Patient Chappell 07/11/2014, 10:03 AM

## 2014-07-11 NOTE — Progress Notes (Signed)
Patient very lethargic this morning. Alert to self but otherwise confused. Deatra Inaan Angiulli PA notified of findings. Vitals done. Patient unable to void for specimen this morning so [facility]. Patient unable to tolerate cath procedure. Dan Angiulli notified. Now waiting for patient to void on own. New order for CT of head in. Continue to monitor.

## 2014-07-11 NOTE — Progress Notes (Signed)
CT of head done and UA collected. PA Dan Angiulli notified. No new orders received. Patient continue to be lethargic but able to awaken easily and answer some simple questions. Continue with plan of care.

## 2014-07-11 NOTE — Plan of Care (Signed)
Problem: RH Balance Goal: LTG Patient will maintain dynamic sitting balance (PT) LTG: Patient will maintain dynamic sitting balance with assistance during mobility activities (PT)  Downgraded due to safety concerns.   Problem: RH Wheelchair Mobility Goal: LTG Patient will propel w/c in controlled environment (PT) LTG: Patient will propel wheelchair in controlled environment, # of feet with assist (PT)  Downgraded due to safety concerns

## 2014-07-11 NOTE — Progress Notes (Signed)
Physical Therapy Session Note  Patient Details  Name: Marguarite ArbourClyde Christine MRN: [id number] Date of Birth: 04-02-1945  Today's Date: 07/11/2014 PT Individual Time: [id number] PT Individual Time Calculation (min): 61 min   Short Term Goals: Week 1:  PT Short Term Goal 1 (Week 1): patient will be able to perform supine to sit transfer with min A. PT Short Term Goal 2 (Week 1): Patient will be able to maintain sitting EOB with  min A and reduced R side push/lean. PT Short Term Goal 3 (Week 1): Patient will be able to perform sit to stand transfer with mod A and maintain standing with mod A for up to 1 min PT Short Term Goal 4 (Week 1): Patient will be able to perform transfer to w/c with mod A PT Short Term Goal 5 (Week 1): Patient will be able to ambulate with LRAD on a distance of 10 feet with mod A  Skilled Therapeutic Interventions/Progress Updates:   Pt received sitting in w/c at nursing station, very lethargic and restless.  Spoke with RN regarding changing w/c to tilt in space for improved positioning and safety.  Assisted pt to/from w/c room in order to gather new 18x18 tilt in space w/c with head rest and custom back.  Then assisted pt to therapy gym in order to work on alertness, attention, following single step commands, memory, standing balance, tolerance, WB through RLE, weight shifting to the L, and postural control with use of standing frame 10 mins x 2 reps with max verbal cues to open eyes during session and to follow commands. Also provided facilitation for posture at trunk and at hips for increased weight shift to the L to decrease pusher tendencies.  Requires HOH for LUE to reach for objects during session.  Transitioned to use of Stedy for increased WB in semi stand (perched position) as well as full standing while performing reaching task, however pt with increased pushing and inability to functionally use LUE during task.  Son present during last part of session to provide encouragement and  cues to pt as well.  Assisted pt back to w/c, tilted back for better positioning and safety.  Returned pt to room and left with quick release belt donned.  Education to son to notify nursing when he is leaving to assist pt to nursing station for increased supervision.  Son verbalized understanding and RN in room to check temperature.    Therapy Documentation Precautions:  Precautions Precautions: Fall Restrictions Weight Bearing Restrictions: No   Vital Signs: Therapy Vitals Temp: 100.3 F (37.9 C) Temp Source: Oral Pulse Rate: 89 Resp: 18 BP: (!) 142/89 mmHg Patient Position (if appropriate): Lying Oxygen Therapy SpO2: 100 % O2 Device: Not Delivered Pain: Pain Assessment Pain Assessment: Faces Faces Pain Scale: No hurt  See FIM for current functional status  Therapy/Group: Individual Therapy  Vista Deckarcell, Adlai Sinning Ann 07/11/2014, 8:20 AM

## 2014-07-11 NOTE — Progress Notes (Signed)
Speech Language Pathology Daily Session Note  Patient Details  Name: Michael Goodwin MRN: [id number] Date of Birth: Jul 11, 1945  Today's Date: 07/11/2014 SLP Individual Time: 0800-0900 SLP Individual Time Calculation (min): 60 min  Short Term Goals: Week 1: SLP Short Term Goal 1 (Week 1): Pt will tolerate dysphagia 2 with nectar thick liquids using swallow strategies with min A given verbal/visual/tactile cueing. SLP Short Term Goal 2 (Week 1): Pt will sustain attention to functional tasks for at least 5 minutes with mod A given multimodal cueing. SLP Short Term Goal 3 (Week 1): Pt will be oriented x4 with visual external aids with max A given multimodal cueing. SLP Short Term Goal 4 (Week 1): Pt will recall biographical information with mod A given multimodal cueing. SLP Short Term Goal 5 (Week 1): Pt will demonstrate basic problem solving in functional tasks with max A given multimodal cueing.  Skilled Therapeutic Interventions:  Pt was seen for skilled ST targeting goals for dysphagia and cognition.  Upon arrival, pt was asleep in bed, awakened to voice and light touch but only able to maintain alertness for 15-30 second intervals.  Pt was transferred to wheelchair with assistance from RN to maximize attention and arousal during structured therapeutic tasks.  Pt was slightly more alert when seated upright in the wheelchair but remained lethargic and confused throughout session.  Pt's speech was also highly unintelligible despite max cues from SLP to increase vocal intensity and slow rate.  SLP facilitated the session with hand over hand assist to facilitate self feeding of dys 2 textures and nectar thick liquids.  Pt also required max assist verbal, visual, and tactile cues to initiate a swallow response due to oral holding.  Pt was noted with significant oral residue of dys 2 textures post swallow due to poor awareness of bolus which appeared to improve with pureed consistencies.  As a result,  recommend that pt's diet be downgraded to dys 1 textures with continued nectar thick liquids.  Recommend that pt be out of bed for all meals and to hold PO when he  is fatigued.  Nurse tech and RN made aware.  Throughout meal pt also required max to total assist to attend to right side of his body and the environment.  He was total assist to reorient to place and situation.  Pt left upright in wheelchair with quick release belt donned for safety due to pushing tendencies and lethargy.  Pt was handed off to OT.  Continue per current plan of care.    FIM:  Comprehension Comprehension Mode: Auditory Comprehension: 2-Understands basic 25 - 49% of the time/requires cueing 51 - 75% of the time Expression Expression Mode: Verbal Expression: 2-Expresses basic 25 - 49% of the time/requires cueing 50 - 75% of the time. Uses single words/gestures. Social Interaction Social Interaction: 2-Interacts appropriately 25 - 49% of time - Needs frequent redirection. Problem Solving Problem Solving: 2-Solves basic 25 - 49% of the time - needs direction more than half the time to initiate, plan or complete simple activities Memory Memory: 1-Recognizes or recalls less than 25% of the time/requires cueing greater than 75% of the time FIM - Eating Eating Activity: 2: Hand over hand assist  Pain Pain Assessment Pain Assessment: No/denies pain  Therapy/Group: Individual Therapy  Gavin Telford, Melanee SpryNicole L 07/11/2014, 12:28 PM

## 2014-07-12 ENCOUNTER — Inpatient Hospital Stay (HOSPITAL_COMMUNITY): Payer: Medicare Other | Admitting: Speech Pathology

## 2014-07-12 ENCOUNTER — Inpatient Hospital Stay (HOSPITAL_COMMUNITY): Payer: Medicare Other | Admitting: Occupational Therapy

## 2014-07-12 ENCOUNTER — Inpatient Hospital Stay (HOSPITAL_COMMUNITY): Payer: Medicare Other | Admitting: Rehabilitation

## 2014-07-12 DIAGNOSIS — N3001 Acute cystitis with hematuria: Secondary | ICD-10-CM

## 2014-07-12 LAB — URINE CULTURE
Colony Count: NO GROWTH
Culture: NO GROWTH

## 2014-07-12 MED ORDER — MIRTAZAPINE 7.5 MG PO TABS
7.5000 mg | ORAL_TABLET | Freq: Every day | ORAL | Status: DC
  Administered 2014-07-12 – 2014-07-19 (×8): 7.5 mg via ORAL
  Filled 2014-07-12 (×9): qty 1

## 2014-07-12 NOTE — Progress Notes (Signed)
Physical Therapy Session Note  Patient Details  Name: Michael Goodwin MRN: [id number] Date of Birth: Jul 18, 1945  Today's Date: 07/12/2014 PT Individual Time: 0830-0930 PT Individual Time Calculation (min): 60 min   Short Term Goals: Week 1:  PT Short Term Goal 1 (Week 1): patient will be able to perform supine to sit transfer with min A. PT Short Term Goal 2 (Week 1): Patient will be able to maintain sitting EOB with  min A and reduced R side push/lean. PT Short Term Goal 3 (Week 1): Patient will be able to perform sit to stand transfer with mod A and maintain standing with mod A for up to 1 min PT Short Term Goal 4 (Week 1): Patient will be able to perform transfer to w/c with mod A PT Short Term Goal 5 (Week 1): Patient will be able to ambulate with LRAD on a distance of 10 feet with mod A  Skilled Therapeutic Interventions/Progress Updates:   Pt received lying in bed, per SLP continues to be very lethargic and requiring total A for bed mobility due to urinary incontinence.  Per RN is now receiving Cipro for UTI.  Assisted pt with bed mobility with HOB flat and without rails to better simulate home and enforce trunk and pelvic flexion and rotation during task.  Pt able to initiate movement much better today than previous session requiring mod A for bed [facility] for RLE across body.  Once at EOB, assisted pt with donning pants prior to leaving room.  Performed standing with total A while rehab tech assisted with pants.  Once pants pulled up, pt stating needing to urinate, however was unable to get urinal in time and pt incontinent of urine, therefore donned new brief and decided to leave pants off as they were soiled and urinary frequency.  Assisted pt into w/c with +2A with facilitation for increased forward weight shift, WB through LEs and hand placement.  Assisted pt to/from therapy gym via w/c total A.  Remainder of skilled session focused on dynamic sitting balance, postural  control, attention to the R, upright posture, and increased alertness.  Had pt perform several reps of reaching far to the L to decrease pusher tendencies as well as upward to facilitate posture and anterior pelvic tilt with trunk extension.  Requires max verbal cues to open eyes during session and tends to keep eyes closed approx 50-60% of session.  Assisted back to w/c and left at nursing station with quick release belt donned for increased S.  RN made aware.   Therapy Documentation Precautions:  Precautions Precautions: Fall Restrictions Weight Bearing Restrictions: No   Vital Signs: Therapy Vitals Temp: 98.2 F (36.8 C) Temp Source: Oral Pulse Rate: 65 Resp: 17 BP: (!) 155/87 mmHg Patient Position (if appropriate): Lying Oxygen Therapy SpO2: 100 % O2 Device: Not Delivered Pain: Pt with no c/o pain during session.  See FIM for current functional status  Therapy/Group: Individual Therapy  Vista Deckarcell, Arnesha Schiraldi Ann 07/12/2014, 8:07 AM

## 2014-07-12 NOTE — Progress Notes (Signed)
Speech Language Pathology Daily Session Note  Patient Details  Name: Michael Goodwin MRN: [id number] Date of Birth: December 22, 1945  Today's Date: 07/12/2014 SLP Individual Time: 0800-0830 SLP Individual Time Calculation (min): 30 min  Short Term Goals: Week 1: SLP Short Term Goal 1 (Week 1): Pt will tolerate dysphagia 2 with nectar thick liquids using swallow strategies with min A given verbal/visual/tactile cueing. SLP Short Term Goal 2 (Week 1): Pt will sustain attention to functional tasks for at least 5 minutes with mod A given multimodal cueing. SLP Short Term Goal 3 (Week 1): Pt will be oriented x4 with visual external aids with max A given multimodal cueing. SLP Short Term Goal 4 (Week 1): Pt will recall biographical information with mod A given multimodal cueing. SLP Short Term Goal 5 (Week 1): Pt will demonstrate basic problem solving in functional tasks with max A given multimodal cueing.  Skilled Therapeutic Interventions:  Pt was seen for skilled ST targeting cognitive goals.  Upon arrival, pt was asleep in bed, lights were off and curtains were drawn.  SLP turned lights on, pulled back curtains, and utilized sternal rub, cold compress, and loud voice to arouse pt.  With the abovementioned interventions, pt was able to maintain alertness for 5-10 second intervals at a time.  Pt was oriented to self only and required max assist multimodal cues to answer general questions appropriately.  Verbal output was characterized by language of confusion and was largely unintelligible.  Pt was also noted with occasional non productive cough; therefore, SLP provided total assist for oral care and repositioned head of bed to >30 degrees to assist in secretion management.  Due to lethargy, SLP instructed the nurse tech and RN to hold PO until he is more alert to safely consume PO.  RN and nurse tech are in agreement.  Continue per current plan of care.     FIM:  Comprehension Comprehension Mode:  Auditory Comprehension: 3-Understands basic 50 - 74% of the time/requires cueing 25 - 50%  of the time Expression Expression Mode: Verbal Expression: 2-Expresses basic 25 - 49% of the time/requires cueing 50 - 75% of the time. Uses single words/gestures. Social Interaction Social Interaction: 2-Interacts appropriately 25 - 49% of time - Needs frequent redirection. Problem Solving Problem Solving: 2-Solves basic 25 - 49% of the time - needs direction more than half the time to initiate, plan or complete simple activities Memory Memory: 1-Recognizes or recalls less than 25% of the time/requires cueing greater than 75% of the time  Pain Pain Assessment Pain Assessment: Faces Faces Pain Scale: No hurt  Therapy/Group: Individual Therapy  Craig Ionescu, Melanee SpryNicole L 07/12/2014, 4:36 PM

## 2014-07-12 NOTE — Progress Notes (Signed)
Occupational Therapy Session Note  Patient Details  Name: Marguarite ArbourClyde Cullimore MRN: [id number] Date of Birth: 08-30-1945  Today's Date: 07/12/2014 OT Individual Time: [id number] and 1345 - 1424 OT Individual Time Calculation (min): 58 min and 39 min    Short Term Goals: Week 1:  OT Short Term Goal 1 (Week 1): pt. will feed self with minimal assist with LUE OT Short Term Goal 2 (Week 1): Pt will maintain static sitting balance for 10 minutes unsupported OT Short Term Goal 3 (Week 1): Pt. will groom self with mod assist OT Short Term Goal 4 (Week 1): Pt. will bathe self with max assist OT Short Term Goal 5 (Week 1): [facility]  Skilled Therapeutic Interventions/Progress Updates:  Session 1: Pt received in tilt in space wheelchair at RN station. Pt with no c/o pain during session and requesting to "wash off". Pt oriented to self only and was able to state birthday correctly. Pt required max A verbal cues and choice of 2 for further orientation to location, time, and situation. Pt very lethargic during session and required constant verbal cues to remain alert. Pt seated in wheelchair for bathing tasks at sink side. Pt did wash face with max verbal cues but required total A for all other bathing and hand over hand assistance to utilize R UE in tasks. Pt required max - total A to stand from wheelchair with 2nd helper to wash buttocks and peri area. Total A to don pull over shirt and B socks on feet.Pt having heavy lean to L side in wheelchair during self care tasks with Max A to correct. Pt required max verbal cues for sequencing and initiation of self care tasks during session. R arm tray and QRB donned and pt seated in tilt in space at RN station for safety.   Session 2: Upon entering the room, pt supine in bed with family members present reporting he had BM in briefs. Supine >sit with Mod A to EOB. Squat pivot transfer with second helper for safety to drop arm commode chair. Pt  required Min - total A seated balance while on BSC chair as pt leaning heavily to the R and also backwards. Pt standing with total A of therapist while second helper performed hygiene and assisted in donning clean depend. Pt transferred +2 squat pivot into tilt in space wheelchair from drop arm commode chair. QRB and arm tray donned. Pt tilted back into safe position with family members reentering the room. Call bell and all needed items within reach upon exiting the room.   Therapy Documentation Precautions:  Precautions Precautions: Fall Restrictions Weight Bearing Restrictions: No  Pain: Pain Assessment Pain Assessment: Faces Faces Pain Scale: No hurt  See FIM for current functional status  Therapy/Group: Individual Therapy  Lowella Gripittman, Yul Diana L 07/12/2014, 12:38 PM

## 2014-07-12 NOTE — Progress Notes (Signed)
Subjective/Complaints: Patient seen in physical therapy. Requiring therapy assistance to maintain upright sitting posture. According to PT he is a little bit more awake today. Discussed the CT results with patient and his therapist. Also patient denies any bladder problems however his awareness is poor and he remains lethargic.  Review of Systems - limited due to mental status  Objective: Vital Signs: Blood pressure 155/87, pulse 65, temperature 98.2 F (36.8 C), temperature source Oral, resp. rate 17, weight 68.6 kg (151 lb 3.8 oz), SpO2 100 %. Ct Head Wo Contrast  07/11/2014   CLINICAL DATA:  Follow-up intra cerebral hemorrhage. Lethargic this morning.  EXAM: CT HEAD WITHOUT CONTRAST  TECHNIQUE: Contiguous axial images were obtained from the base of the skull through the vertex without intravenous contrast.  COMPARISON:  07/07/2014  FINDINGS: Again noted is a left thalamic intra cerebral hemorrhage measuring 3.5 x 2.3 cm compared with 3.2 x 2.2 cm previously. Mild surrounding the edema. No significant midline shift. No hydrocephalus. No new areas of hemorrhage. No acute calvarial abnormality.  IMPRESSION: Left thalamic intra cerebral hemorrhage measures slightly larger (3.5 cm compared with 3.2 cm previously) although visually this appears essentially stable. No new areas of hemorrhage.   Electronically Signed   By: Rolm Baptise M.D.   On: 07/11/2014 15:09   Results for orders placed or performed during the hospital encounter of 07/07/14 (from the past 72 hour(s))  CBC WITH DIFFERENTIAL     Status: Abnormal   Collection Time: 07/10/14  6:05 AM  Result Value Ref Range   WBC 7.5 4.0 - 10.5 K/uL   RBC 4.56 4.22 - 5.81 MIL/uL   Hemoglobin 13.7 13.0 - 17.0 g/dL   HCT 40.7 39.0 - 52.0 %   MCV 89.3 78.0 - 100.0 fL   MCH 30.0 26.0 - 34.0 pg   MCHC 33.7 30.0 - 36.0 g/dL   RDW 13.3 11.5 - 15.5 %   Platelets 168 150 - 400 K/uL   Neutrophils Relative % 80 (H) 43 - 77 %   Neutro Abs 6.0 1.7 - 7.7  K/uL   Lymphocytes Relative 16 12 - 46 %   Lymphs Abs 1.2 0.7 - 4.0 K/uL   Monocytes Relative 2 (L) 3 - 12 %   Monocytes Absolute 0.2 0.1 - 1.0 K/uL   Eosinophils Relative 2 0 - 5 %   Eosinophils Absolute 0.2 0.0 - 0.7 K/uL   Basophils Relative 0 0 - 1 %   Basophils Absolute 0.0 0.0 - 0.1 K/uL  Comprehensive metabolic panel     Status: Abnormal   Collection Time: 07/10/14  6:05 AM  Result Value Ref Range   Sodium 135 135 - 145 mmol/L   Potassium 3.5 3.5 - 5.1 mmol/L   Chloride 98 (L) 101 - 111 mmol/L   CO2 26 22 - 32 mmol/L   Glucose, Bld 93 65 - 99 mg/dL   BUN 10 6 - 20 mg/dL   Creatinine, Ser 1.11 0.61 - 1.24 mg/dL   Calcium 9.5 8.9 - 10.3 mg/dL   Total Protein 8.2 (H) 6.5 - 8.1 g/dL   Albumin 4.0 3.5 - 5.0 g/dL   AST 25 15 - 41 U/L   ALT 19 17 - 63 U/L   Alkaline Phosphatase 64 38 - 126 U/L   Total Bilirubin 0.8 0.3 - 1.2 mg/dL   GFR calc non Af Amer >60 >60 mL/min   GFR calc Af Amer >60 >60 mL/min    Comment: (NOTE) The eGFR has  been calculated using the CKD EPI equation. This calculation has not been validated in all clinical situations. eGFR's persistently <60 mL/min signify possible Chronic Kidney Disease.    Anion gap 11 5 - 15  Glucose, capillary     Status: None   Collection Time: 07/10/14  4:23 PM  Result Value Ref Range   Glucose-Capillary 89 65 - 99 mg/dL   Comment 1 Notify RN   Urinalysis, Routine w reflex microscopic (not at [facility])     Status: Abnormal   Collection Time: 07/11/14  2:43 PM  Result Value Ref Range   Color, Urine AMBER (A) YELLOW    Comment: BIOCHEMICALS MAY BE AFFECTED BY COLOR   APPearance CLOUDY (A) CLEAR   Specific Gravity, Urine 1.015 1.005 - 1.030   pH 5.0 5.0 - 8.0   Glucose, UA NEGATIVE NEGATIVE mg/dL   Hgb urine dipstick LARGE (A) NEGATIVE   Bilirubin Urine SMALL (A) NEGATIVE   Ketones, ur 15 (A) NEGATIVE mg/dL   Protein, ur NEGATIVE NEGATIVE mg/dL   Urobilinogen, UA 1.0 0.0 - 1.0 mg/dL   Nitrite NEGATIVE NEGATIVE    Leukocytes, UA MODERATE (A) NEGATIVE  Urine microscopic-add on     Status: Abnormal   Collection Time: 07/11/14  2:43 PM  Result Value Ref Range   Squamous Epithelial / LPF FEW (A) RARE   WBC, UA 11-20 <3 WBC/hpf   RBC / HPF TOO NUMEROUS TO COUNT <3 RBC/hpf   Bacteria, UA FEW (A) RARE     HEENT: normal Cardio: RRR and no murmur Resp: CTA B/L and unlabored GI: BS positive and NT, ND Extremity:  Pulses positive and No Edema Skin:   Intact Neuro: Lethargic, Confused, Abnormal Sensory reduced sensation to pinch in RUE and RLE, Abnormal Motor trace elbow ext on R UE, 2- Hip/knee ext synergy on R side and Dysarthric Musc/Skel:  Other no pain with AROM of limbs Gen NAD   Assessment/Plan: 1. 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 Physiatrist is providing close team supervision and 24 hour management of active medical problems listed below. Physiatrist and rehab team continue to assess barriers to discharge/monitor patient progress toward functional and medical goals.Team conference today please see physician documentation under team conference tab, met with team face-to-face to discuss problems,progress, and goals. Formulized individual treatment plan based on medical history, underlying problem and comorbidities.  Pt with persistent somnolence mainly due to [facility] location in thalamus affecting reticular activating system Some influence from UTI as well, may need to start neuro stimulant once UTI is treated FIM: FIM - Bathing Bathing: 1: Two helpers  FIM - Upper Body Dressing/Undressing Upper body dressing/undressing: 0: Wears gown/pajamas-no public clothing FIM - Lower Body Dressing/Undressing Lower body dressing/undressing: 0: Wears Interior and spatial designer  FIM -  Toileting Toileting: 0: No continent bowel/bladder events this shift  FIM - Air cabin crew Transfers: 0-Activity did not occur  FIM - Control and instrumentation engineer Devices: HOB elevated Bed/Chair Transfer: 0: Activity did not occur  FIM - Locomotion: Wheelchair Locomotion: Wheelchair: 0: Activity did not occur FIM - Locomotion: Ambulation Locomotion: Ambulation Assistive Devices: Other (comment) ("three musketeer style) Ambulation/Gait Assistance: 1: +2 Total assist Locomotion: Ambulation: 0: Activity did not occur  Comprehension Comprehension Mode: Auditory Comprehension: 3-Understands basic 50 - 74% of the time/requires cueing 25 - 50%  of  the time  Expression Expression Mode: Verbal Expression: 2-Expresses basic 25 - 49% of the time/requires cueing 50 - 75% of the time. Uses single words/gestures.  Social Interaction Social Interaction: 2-Interacts appropriately 25 - 49% of time - Needs frequent redirection.  Problem Solving Problem Solving: 2-Solves basic 25 - 49% of the time - needs direction more than half the time to initiate, plan or complete simple activities  Memory Memory: 1-Recognizes or recalls less than 25% of the time/requires cueing greater than 75% of the time   Medical Problem List and Plan: 1. 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 DVT Prophylaxis/Anticoagulation: Subcutaneous heparin for DVT prophylaxis initiated 07/06/2014 3. Pain Management: Tylenol as needed 4. Dysphagia. Dysphagia 3 thin liquids. Monitor for any signs of aspiration. Follow-up speech therapy.IVFs for hydration due to decreased nutritional storage and hydration 5. Neuropsych: This patient is capable of making decisions on his own behalf. 6. Skin/Wound Care: Routine skin checks 7. Fluids/Electrolytes/Nutrition: Routine I nose with follow-up chemistries 8. Hypertension. Lisinopril 20 mg daily.  Monitor with increased mobility 9. Tobacco abuse, counseling 10.  Elevated temp x1 UTI, + UA ,empiric antibiotics BBC x 2 from 5/27 pending , WBC 5/30 were normal, CXR 5/27 without infiltrate LOS (Days) 5 A FACE TO FACE EVALUATION WAS PERFORMED  KIRSTEINS,ANDREW E 07/12/2014, 9:02 AM

## 2014-07-13 ENCOUNTER — Inpatient Hospital Stay (HOSPITAL_COMMUNITY): Payer: Medicare Other | Admitting: Occupational Therapy

## 2014-07-13 ENCOUNTER — Inpatient Hospital Stay (HOSPITAL_COMMUNITY): Payer: Medicare Other

## 2014-07-13 ENCOUNTER — Inpatient Hospital Stay (HOSPITAL_COMMUNITY): Payer: Medicare Other | Admitting: Rehabilitation

## 2014-07-13 ENCOUNTER — Inpatient Hospital Stay (HOSPITAL_COMMUNITY): Payer: Medicare Other | Admitting: Speech Pathology

## 2014-07-13 ENCOUNTER — Encounter (HOSPITAL_COMMUNITY): Payer: Self-pay | Admitting: *Deleted

## 2014-07-13 DIAGNOSIS — R400 Somnolence: Secondary | ICD-10-CM

## 2014-07-13 MED ORDER — METHYLPHENIDATE HCL 5 MG PO TABS
5.0000 mg | ORAL_TABLET | Freq: Two times a day (BID) | ORAL | Status: DC
  Administered 2014-07-13 – 2014-07-19 (×13): 5 mg via ORAL
  Filled 2014-07-13 (×13): qty 1

## 2014-07-13 NOTE — Progress Notes (Signed)
Social Work Patient ID: Michael Goodwin, male   DOB: [birth year], 69 y.o.   MRN: [id number]   CSW met with pt briefly and talked to his dtr via telephone to update them on the team conference discussion.  CSW explained that pt's scheduled d/c date is 07-28-14.  She stated that she, her brother, and their family friend (who is like a son to pt) would be the ones to care for him at home.  They will rotate their schedules so that someone will be with pt 24/7.  CSW explained that pt would most likely use the w/c a lot and that he would require hands on assistance. She feels they can handle this at home.  CSW will continue to follow and assist pt/family as needed.

## 2014-07-13 NOTE — Progress Notes (Signed)
Occupational Therapy Session Note  Patient Details  Name: Michael ArbourClyde Goodwin MRN: [id number] Date of Birth: 1945-05-04  Today's Date: 07/13/2014 OT Individual Time: [id number] OT Individual Time Calculation (min): 30 min    Short Term Goals: Week 1:  OT Short Term Goal 1 (Week 1): pt. will feed self with minimal assist with LUE OT Short Term Goal 2 (Week 1): Pt will maintain static sitting balance for 10 minutes unsupported OT Short Term Goal 3 (Week 1): Pt. will groom self with mod assist OT Short Term Goal 4 (Week 1): Pt. will bathe self with max assist OT Short Term Goal 5 (Week 1): [facility]  Skilled Therapeutic Interventions/Progress Updates:    Skilled treatment with focus on unsupported sitting balance and attention to RUE.  Pt received in tilt-in space w/c.  Completed squat pivot transfer +2 with over the back technique to promote forward weight shift and decrease pushing tendencies.  Engaged in sitting balance on edge of mat with focus on upright attention and positioning to promote sustained sitting.  Transitioned to leaning on Lt elbow to decrease pushing to Rt in sitting.  Pt with eyes closed approx 50% of session requiring cues to remain attentive to task.  Squat pivot back to w/c with +2 with improved initiation transferring to Lt, cues to keep both arms in lap to decrease pushing.  Left tilted back in w/c in room with son present.  Therapy Documentation Precautions:  Precautions Precautions: Fall Restrictions Weight Bearing Restrictions: No General:   Vital Signs: Therapy Vitals Temp: 97.5 F (36.4 C) Temp Source: Oral Pulse Rate: 96 Resp: 18 BP: 112/78 mmHg Patient Position (if appropriate): Lying Oxygen Therapy SpO2: 98 % O2 Device: Not Delivered Pain:  Pt with no c/o pain  See FIM for current functional status  Therapy/Group: Individual Therapy  Rosalio LoudHOXIE, Austin Pongratz 07/13/2014, 3:13 PM

## 2014-07-13 NOTE — Progress Notes (Signed)
Occupational Therapy Session Note  Patient Details  Name: Marguarite ArbourClyde Micek MRN: [id number] Date of Birth: [birth year]  Today's Date: 07/13/2014 OT Individual Time: 1400-1430 OT Individual Time Calculation (min): 30 min    Short Term Goals: Week 1:  OT Short Term Goal 1 (Week 1): pt. will feed self with minimal assist with LUE OT Short Term Goal 2 (Week 1): Pt will maintain static sitting balance for 10 minutes unsupported OT Short Term Goal 3 (Week 1): Pt. will groom self with mod assist OT Short Term Goal 4 (Week 1): Pt. will bathe self with max assist OT Short Term Goal 5 (Week 1): [facility]  Skilled Therapeutic Interventions/Progress Updates: Therapeutic activity with focus on right attention, weight-shifting, static standing, transfers, sitting balance (static and dynamic), and bed mobility.   Pt received at RN station, alert and responsive to therapist although with slurred speech.   Pt was escorted to his room and completed sit<>stand, static standing, and stand-pivot transfer to sit at EOB with mod assist to stand (pt=60%), manual facilitation to weight-shift between right and left LE, and max assist to advance right and left legs.   Pt sat at EOB with lowering assist (pt=60%) and remained at EOB with steadying assist to maintain static sitting balance.   Pt reported prior interest of playing cards and was highly motivated to attend to cards dealt as simulating 5-card stud poker game.   With mod multi-modal cues, pt selected 1 of 5 cards to discard/replace appropriately and played 3-of-a-kind against dealer's 2 pair.   Pt could not recall rules to determine if had won during this session.    Pt maintained sitting balance with only intermittent steadying assist during game play, approximately 6 minutes.   Pt assisted to return to supine in bed with mod assist to lift right leg and max assist to reposition toward head of bed.     Therapy Documentation Precautions:   Precautions Precautions: Fall Restrictions Weight Bearing Restrictions: No  Vital Signs: Therapy Vitals Temp: 97.5 F (36.4 C) Temp Source: Oral Pulse Rate: 96 Resp: 18 BP: 112/78 mmHg Patient Position (if appropriate): Lying Oxygen Therapy SpO2: 98 % O2 Device: Not Delivered   Pain: No/denies pain  See FIM for current functional status  Therapy/Group: Individual Therapy  Rilea Arutyunyan 07/13/2014, 2:43 PM

## 2014-07-13 NOTE — Progress Notes (Signed)
Speech Language Pathology Daily Session Note  Patient Details  Name: Michael Goodwin MRN: [id number] Date of Birth: 1945-04-21  Today's Date: 07/13/2014 SLP Individual Time: 1500-1510 SLP Individual Time Calculation (min): 10 min and Today's Date: 07/13/2014 SLP Missed Time: 20 Minutes Missed Time Reason: Patient fatigue  Short Term Goals: Week 1: SLP Short Term Goal 1 (Week 1): Pt will tolerate dysphagia 2 with nectar thick liquids using swallow strategies with min A given verbal/visual/tactile cueing. SLP Short Term Goal 2 (Week 1): Pt will sustain attention to functional tasks for at least 5 minutes with mod A given multimodal cueing. SLP Short Term Goal 3 (Week 1): Pt will be oriented x4 with visual external aids with max A given multimodal cueing. SLP Short Term Goal 4 (Week 1): Pt will recall biographical information with mod A given multimodal cueing. SLP Short Term Goal 5 (Week 1): Pt will demonstrate basic problem solving in functional tasks with max A given multimodal cueing.  Skilled Therapeutic Interventions: Skilled treatment session focused on cognitive goals. Upon arrival, patient was asleep while supine in bed.  SLP utilized environment modifications such as turning the lights on, sternal rub, cold compress, repositioning, and loud voice to arouse pt.  With the abovementioned interventions, pt was able to maintain alertness for 5 second intervals at a time. RN made aware and reported patient had recently been transferred back to bed due to fatigue and lethargy. Patient was essentially nonverbal throughout the session and did not answer any basic question in regards to wants/needs despite Max A multimodal cues. Patient missed remaining 20 minutes of session due to inability to participate because of fatigue and lethargy. Patient left supine in bed with alarm on and all needs within reach. Continue with current plan of care.    FIM:  Comprehension Comprehension Mode:  Auditory Comprehension: 1-Understands basic less than 25% of the time/requires cueing 75% of the time Expression Expression Mode: Verbal Expression: 1-Expresses basis less than 25% of the time/requires cueing greater than 75% of the time. Social Interaction Social Interaction: 1-Interacts appropriately less than 25% of the time. May be withdrawn or combative. Problem Solving Problem Solving: 1-Solves basic less than 25% of the time - needs direction nearly all the time or does not effectively solve problems and may need a restraint for safety Memory Memory: 1-Recognizes or recalls less than 25% of the time/requires cueing greater than 75% of the time  Pain Pain Assessment Pain Assessment: No/denies pain  Therapy/Group: Individual Therapy  Michael Goodwin 07/13/2014, 3:42 PM

## 2014-07-13 NOTE — Progress Notes (Signed)
Social Work Patient ID: Michael Goodwin, male   DOB: 24-Nov-1945, 69 y.o.   MRN: [id number]   Nigel Sloop, LCSW Social Worker Signed  Patient Care Conference 07/13/2014  9:00 AM    Expand All Collapse All   Inpatient RehabilitationTeam Conference and Plan of Care Update Date: 07/12/2014   Time: 10:30 AM     Patient Name: Michael Goodwin       Medical Record Number: [id number]  Date of Birth: [birth year] Sex: Male         Room/Bed: 4W15C/4W15C-01 Payor Info: Payor: MEDICARE / Plan: MEDICARE PART A AND B / Product Type: *No Product type* /    Admitting Diagnosis: CVA  Admit Date/Time:  07/07/2014  5:08 PM Admission Comments: No comment available   Primary Diagnosis:  Nontraumatic thalamic hemorrhage Principal Problem: Nontraumatic thalamic hemorrhage    Patient Active Problem List     Diagnosis  Date Noted   .  Alterations of sensations following CVA (cerebrovascular accident)  07/10/2014   .  Acute respiratory failure with hypoxia  07/07/2014   .  Abnormal urinalysis  07/07/2014   .  Hypokalemia  07/07/2014   .  Dehydration  07/07/2014   .  Metabolic encephalopathy  07/07/2014   .  Aortic root dilatation (48 mm May 2016)  07/07/2014   .  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  ICH (intracerebral hemorrhage)  07/07/2014   .  Nontraumatic thalamic hemorrhage  07/06/2014   .  Hemiplegia affecting right dominant side  07/06/2014   .  Cognitive deficit due to recent stroke  07/06/2014   .  IVH (intraventricular hemorrhage)  07/06/2014   .  Essential hypertension  07/06/2014   .  Intracranial hemorrhage       Expected Discharge Date: Expected Discharge Date: 07/28/14  Team Members Present: Physician leading conference: Dr. Claudette Laws Social Worker Present: Staci Acosta, LCSW Nurse Present: Carmie End, RN PT Present: Edman Circle, Lillie Columbia, PT OT Present: Primitivo Gauze, Suszanne Conners, OT SLP Present: Jackalyn Lombard, SLP PPS Coordinator present : Tora Duck, RN, CRRN        Current Status/Progress  Goal  Weekly Team Focus   Medical     Severe somnolence,  workup suggested UTI however culture was negative, trial of methylphenidate  Crease level of alertness to allow full therapy participation  Medication trial   Bowel/Bladder     Incontinent of bowel and bladder. LBM 07/11/14   Mod assist  timed toileting q 2hr.   Swallow/Nutrition/ Hydration     Downgraded to dys 1 textures with nectar thick liquids due to lethargy   supervision for least restrictive diet; may need to be downgraded pending alertness   safety and alertness for PO intake     ADL's     total A overall for self care, pt requires 1 helper to assist with standing while 2nd helper performs LB hygiene and clothing management, very lethargic during session.  S- min overall  R NMR, attention, functional transfers, balance, pt/family education   Mobility     Pt currently total A for bed mobility, max to +2A for transfers pending level of arousal, max A for standing (+2 for more dynamic tasks) and +2A for gait.  Pt has been severely limited by fatigue and lethargy during sessions.   S to min A overall at w/c level  R NMR, alertness, attention, transfers, balance, pt/family education   Communication     severe  dysarthria further exacerbated by language of confusion    mod I, will likely need to be downgraded   orientation, slow rate, increased vocal intensity    Safety/Cognition/ Behavioral Observations    max to total assist   min assist for basic   alertness, attention to tasks, right attention, orientation   Pain     No current complaints of pain, 650mg  Tylenol q 4hr PRN for mild pain  <3 on a 0-10 scale or 4 on a FACES scale   assess pain q 4hr and medicate as needed    Skin     Abrasion to Rt. leg, OTA, Skin tear to Rt. arm with foam  No new skin breakdown or infection while on rehab   assess skin q shift     Rehab Goals Patient on target to meet rehab goals: Yes Rehab Goals  Revised: none - first conference *See Care Plan and progress notes for long and short-term goals.    Barriers to Discharge:  See above     Possible Resolutions to Barriers:   See above     Discharge Planning/Teaching Needs:   Pt to go to his home at d/c with dtr, son, and family friend taking turns being with pt.  The above caregivers to come in for family education closer to pt's d/c.    Team Discussion:    Pt with a large thalamic hemorrhage.  Pt is somnolent from the CVA.  Dr. Wynn BankerKirsteins is considering starting pt on Ritalin.  Pt seems to have his days and nights mixed up, talking with the nurses and up at night and then asleep and lethargic during the day.  Not eating well due to lethargy.  ST downgraded diet to D1 due to lethargy and keeping pt safe while eating.  ST to continue to monitor how pt is handling secretions.  Pt has poor sitting balance and is currently a +2 with PT.  Most of his goals are w/c level goals.     Revisions to Treatment Plan:    none    Continued Need for Acute Rehabilitation Level of Care: The patient requires daily medical management by a physician with specialized training in physical medicine and rehabilitation for the following conditions: Daily medical management of patient stability for increased activity during participation in an intensive rehabilitation regime.: Yes Daily analysis of laboratory values and/or radiology reports with any subsequent need for medication adjustment of medical intervention for : Neurological problems  Tynesha Free, Vista DeckJennifer Capps 07/13/2014, 12:04 PM

## 2014-07-13 NOTE — Progress Notes (Signed)
Occupational Therapy Session Note  Patient Details  Name: Marguarite ArbourClyde Hohn MRN: [id number] Date of Birth: 02/23/1945  Today's Date: 07/13/2014 OT Individual Time: [id number] OT Individual Time Calculation (min): 54 min    Short Term Goals: Week 1:  OT Short Term Goal 1 (Week 1): pt. will feed self with minimal assist with LUE OT Short Term Goal 2 (Week 1): Pt will maintain static sitting balance for 10 minutes unsupported OT Short Term Goal 3 (Week 1): Pt. will groom self with mod assist OT Short Term Goal 4 (Week 1): Pt. will bathe self with max assist OT Short Term Goal 5 (Week 1): [facility]  Skilled Therapeutic Interventions/Progress Updates:  Upon entering the room, pt supine in bed with no c/o pain this session. Pt oriented to self only and unable to orient self correctly when give 2 options.  Supine >sit with total A as pt performing less than 15%. Pt continues to be lethargic at times and needing verbal cues to open eyes during session and to answer questions. Stand pivot transfer with Max A of 1 and second helper for safety into roll in shower chair. Total A in shower for bathing task as pt only able to wash face without assistance. After shower, pt transferred into tilt in space wheelchair for dressing tasks. Pt required constant assistance for postural control with strong R lateral lean in wheelchair and in shower chair during session. Speech was difficult to understand throughout session. Therapist assisted pt with hand over hand grooming while at sink side. Pt requiring max verbal cues and HOH for initiation of tasks. Pt seated in wheelchair tilted with QRB donned and NT bring breakfast tray into room.   Therapy Documentation Precautions:  Precautions Precautions: Fall Restrictions Weight Bearing Restrictions: No General:   Vital Signs: Therapy Vitals Temp: 97.5 F (36.4 C) Temp Source: Oral Pulse Rate: 96 Resp: 18 BP: 112/78 mmHg Patient  Position (if appropriate): Lying Oxygen Therapy SpO2: 98 % O2 Device: Not Delivered Pain: Pain Assessment Pain Assessment: No/denies pain ADL:   Exercises:   Other Treatments:    See FIM for current functional status  Therapy/Group: Individual Therapy  Lowella Gripittman, Tsuneo Faison L 07/13/2014, 4:34 PM

## 2014-07-13 NOTE — Progress Notes (Signed)
Patient received his first dose of Remeron 7.5mg  last night, positive result noted. Patient slept in longer intervals than prior nights.

## 2014-07-13 NOTE — Progress Notes (Signed)
Physical Therapy Session Note  Patient Details  Name: Michael Goodwin MRN: [id number] Date of Birth: Nov 27, 1945  Today's Date: 07/13/2014 PT Individual Time: 1300-1400 PT Individual Time Calculation (min): 60 min   Short Term Goals: Week 1:  PT Short Term Goal 1 (Week 1): patient will be able to perform supine to sit transfer with min A. PT Short Term Goal 2 (Week 1): Patient will be able to maintain sitting EOB with  min A and reduced R side push/lean. PT Short Term Goal 3 (Week 1): Patient will be able to perform sit to stand transfer with mod A and maintain standing with mod A for up to 1 min PT Short Term Goal 4 (Week 1): Patient will be able to perform transfer to w/c with mod A PT Short Term Goal 5 (Week 1): Patient will be able to ambulate with LRAD on a distance of 10 feet with mod A  Skilled Therapeutic Interventions/Progress Updates:   Pt received sitting in w/c at nursing station, agreeable to therapy session.  Continue to note that pt with fatigue/lethargy and tending to keep eyes closed during session.  Skilled session focused on dynamic sitting and standing balance, postural control, alertness, attention, pelvic mobility/dissociation.  Performed squat pivot transfers during session to the L at max A level and +2A to the R due to increased pushing to the R through UE and LE.  Once on mat, utilized stedy for sit<>stand, perched sitting and full standing for WB through RLE, reaching to the L to decrease pusher tendencies, as well as reaching upward to facilitate upright trunk and head posture.  Requires +2 to total A to maintain upright posture with HOH assist to initiate movement in LUE, maintain RLE positioning inside of stedy and to maintain alertness.  Also transitioned to sitting on EOM with PT on physioball to facilitate anterior pelvic tilt, trunk extension, with BUEs over therapists legs to decrease pushing and support RUE to unweight and allow more upright posture.  Attempted several  times to keep pts attention with loud voice and touch, however continued to close eyes during session and demonstrate severely poor postural control.  Transferred back to w/c and assisted back to nursing station with quick release belt donned and all needs in reach. RN made aware.   Therapy Documentation Precautions:  Precautions Precautions: Fall Restrictions Weight Bearing Restrictions: No  Pain: Pt with slight s/s of pain in abdomen, RN made aware.    See FIM for current functional status  Therapy/Group: Individual Therapy  Vista Deckarcell, Tobias Avitabile Ann 07/13/2014, 12:33 PM

## 2014-07-13 NOTE — Progress Notes (Signed)
[facility]patient Rehabilitation Center Individual Statement of Services  Patient Name:  Michael ArbourClyde Goodwin  Date:  07/13/2014  Welcome to the [facility]patient Rehabilitation [facility].  Our goal is to provide you with an individualized program based on your diagnosis and situation, designed to meet your specific needs.  With this comprehensive rehabilitation program, you will be expected to participate in at least 3 hours of rehabilitation therapies Monday-Friday, with modified therapy programming on the weekends.  Your rehabilitation program will include the following services:  Physical Therapy (PT), Occupational Therapy (OT), Speech Therapy (ST), 24 hour per day rehabilitation nursing, Case Management (Social Worker), Rehabilitation Medicine, Nutrition Services and Pharmacy Services  Weekly team conferences will be held on Wednesdays to discuss your progress.  Your Social Worker will talk with you frequently to get your input and to update you on team discussions.  Team conferences with you and your family in attendance may also be held.  Expected length of stay:  3 weeks  Overall anticipated outcome:  Minimal Assistance; wheelchair use  Depending on your progress and recovery, your program may change. Your Social Worker will coordinate services and will keep you informed of any changes. Your Social Worker's name and contact numbers are listed  below.  The following services may also be recommended but are not provided by the Inpatient Rehabilitation Center:   Driving Evaluations  Home Health Rehabiltiation Services  Outpatient Rehabilitation Services   Arrangements will be made to provide these services after discharge if needed.  Arrangements include referral to agencies that provide these services.  Your insurance has been verified to be:  Medicare Your primary doctor is:  None, but [facility] per your daughter's request  Pertinent information will be shared with your  doctor and your insurance company.  Social Worker:  Staci AcostaJenny Kymoni Lesperance, LCSW  [phone] or ([phone]) [phone]  Information discussed with and copy given to patient by: Elvera LennoxPrevatt, Kwesi Sangha Capps, 07/13/2014, 1:55 PM

## 2014-07-13 NOTE — Patient Care Conference (Signed)
Inpatient RehabilitationTeam Conference and Plan of Care Update Date: 07/12/2014   Time: 10:30 AM    Patient Name: Michael Goodwin      Medical Record Number: [id number]  Date of Birth: 1945/12/31 Sex: Male         Room/Bed: 4W15C/4W15C-01 Payor Info: Payor: MEDICARE / Plan: MEDICARE PART A AND B / Product Type: *No Product type* /    Admitting Diagnosis: CVA  Admit Date/Time:  07/07/2014  5:08 PM Admission Comments: No comment available   Primary Diagnosis:  Nontraumatic thalamic hemorrhage Principal Problem: Nontraumatic thalamic hemorrhage  Patient Active Problem List   Diagnosis Date Noted  . Alterations of sensations following CVA (cerebrovascular accident) 07/10/2014  . Acute respiratory failure with hypoxia 07/07/2014  . Abnormal urinalysis 07/07/2014  . Hypokalemia 07/07/2014  . Dehydration 07/07/2014  . Metabolic encephalopathy 07/07/2014  . Aortic root dilatation (48 mm May 2016) 07/07/2014  . 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 ICH (intracerebral hemorrhage) 07/07/2014  . 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 Hemiplegia affecting right dominant side 07/06/2014  . Cognitive deficit due to recent stroke 07/06/2014  . IVH (intraventricular hemorrhage) 07/06/2014  . Essential hypertension 07/06/2014  . Intracranial hemorrhage     Expected Discharge Date: Expected Discharge Date: 07/28/14  Team Members Present: Physician leading conference: Dr. Claudette Laws Social Worker Present: Staci Acosta, LCSW Nurse Present: Carmie End, RN PT Present: Edman Circle, Lillie Columbia, PT OT Present: Primitivo Gauze, Suszanne Conners, OT SLP Present: Jackalyn Lombard, SLP PPS Coordinator present : Tora Duck, RN, CRRN     Current Status/Progress Goal Weekly Team Focus  Medical   Severe somnolence,  workup suggested UTI however culture was negative, trial of methylphenidate  Crease level of alertness to allow full therapy participation  Medication trial    Bowel/Bladder   Incontinent of bowel and bladder. LBM 07/11/14  Mod assist  timed toileting q 2hr.   Swallow/Nutrition/ Hydration   Downgraded to dys 1 textures with nectar thick liquids due to lethargy   supervision for least restrictive diet; may need to be downgraded pending alertness   safety and alertness for PO intake    ADL's   total A overall for self care, pt requires 1 helper to assist with standing while 2nd helper performs LB hygiene and clothing management, very lethargic during session.  S- min overall  R NMR, attention, functional transfers, balance, pt/family education   Mobility   Pt currently total A for bed mobility, max to +2A for transfers pending level of arousal, max A for standing (+2 for more dynamic tasks) and +2A for gait.  Pt has been severely limited by fatigue and lethargy during sessions.   S to min A overall at w/c level  R NMR, alertness, attention, transfers, balance, pt/family education   Communication   severe dysarthria further exacerbated by language of confusion   mod I, will likely need to be downgraded   orientation, slow rate, increased vocal intensity    Safety/Cognition/ Behavioral Observations  max to total assist   min assist for basic   alertness, attention to tasks, right attention, orientation   Pain   No current complaints of pain,  Tylenol q 4hr PRN for mild pain  <3 on a 0-10 scale or 4 on a FACES scale  assess pain q 4hr and medicate as needed   Skin   Abrasion to Rt. leg, OTA, Skin tear to Rt. arm with foam  No new skin breakdown or infection while on  rehab  assess skin q shift     Rehab Goals Patient on target to meet rehab goals: Yes Rehab Goals Revised: none - first conference *See Care Plan and progress notes for long and short-term goals.  Barriers to Discharge: See above    Possible Resolutions to Barriers:  See above    Discharge Planning/Teaching Needs:  Pt to go to his home at d/c with dtr, son, and family friend  taking turns being with pt.  The above caregivers to come in for family education closer to pt's d/c.   Team Discussion:  Pt with a large thalamic hemorrhage.  Pt is somnolent from the CVA.  Dr. Wynn BankerKirsteins is considering starting pt on Ritalin.  Pt seems to have his days and nights mixed up, talking with the nurses and up at night and then asleep and lethargic during the day.  Not eating well due to lethargy.  ST downgraded diet to D1 due to lethargy and keeping pt safe while eating.  ST to continue to monitor how pt is handling secretions.  Pt has poor sitting balance and is currently a +2 with PT.  Most of his goals are w/c level goals.    Revisions to Treatment Plan:  none   Continued Need for Acute Rehabilitation Level of Care: The patient requires daily medical management by a physician with specialized training in physical medicine and rehabilitation for the following conditions: Daily medical management of patient stability for increased activity during participation in an intensive rehabilitation regime.: Yes Daily analysis of laboratory values and/or radiology reports with any subsequent need for medication adjustment of medical intervention for : Neurological problems  Kylar Speelman, Vista DeckJennifer Capps 07/13/2014, 12:04 PM

## 2014-07-13 NOTE — Progress Notes (Signed)
CC unable to voice Subjective/Complaints: Pt somnolent this am, has not had breakfast  Review of Systems - limited due to mental status  Objective: Vital Signs: Blood pressure 136/98, pulse 70, temperature 98.6 F (37 C), temperature source Oral, resp. rate 18, weight 68.6 kg (151 lb 3.8 oz), SpO2 97 %. Ct Head Wo Contrast  07/11/2014   CLINICAL DATA:  Follow-up intra cerebral hemorrhage. Lethargic this morning.  EXAM: CT HEAD WITHOUT CONTRAST  TECHNIQUE: Contiguous axial images were obtained from the base of the skull through the vertex without intravenous contrast.  COMPARISON:  07/07/2014  FINDINGS: Again noted is a left thalamic intra cerebral hemorrhage measuring 3.5 x 2.3 cm compared with 3.2 x 2.2 cm previously. Mild surrounding the edema. No significant midline shift. No hydrocephalus. No new areas of hemorrhage. No acute calvarial abnormality.  IMPRESSION: Left thalamic intra cerebral hemorrhage measures slightly larger (3.5 cm compared with 3.2 cm previously) although visually this appears essentially stable. No new areas of hemorrhage.   Electronically Signed   By: Charlett NoseKevin  Dover M.D.   On: 07/11/2014 15:09   Results for orders placed or performed during the hospital encounter of 07/07/14 (from the past 72 hour(s))  Glucose, capillary     Status: None   Collection Time: 07/10/14  4:23 PM  Result Value Ref Range   Glucose-Capillary 89 65 - 99 mg/dL   Comment 1 Notify RN   Urine culture     Status: None   Collection Time: 07/11/14  2:38 PM  Result Value Ref Range   Specimen Description URINE, CLEAN CATCH    Special Requests NONE    Colony Count NO GROWTH [facility]formed at [facility]     Culture NO GROWTH Performed at [facility]     Report Status 07/12/2014 FINAL   Urinalysis, Routine w reflex microscopic (not [facility])     Status: Abnormal   Collection Time: 07/11/14  2:43 PM  Result Value Ref Range   Color, Urine AMBER (A) YELLOW    Comment: BIOCHEMICALS MAY  BE AFFECTED BY COLOR   APPearance CLOUDY (A) CLEAR   Specific Gravity, Urine 1.015 1.005 - 1.030   pH 5.0 5.0 - 8.0   Glucose, UA NEGATIVE NEGATIVE mg/dL   Hgb urine dipstick LARGE (A) NEGATIVE   Bilirubin Urine SMALL (A) NEGATIVE   Ketones, ur 15 (A) NEGATIVE mg/dL   Protein, ur NEGATIVE NEGATIVE mg/dL   Urobilinogen, UA 1.0 0.0 - 1.0 mg/dL   Nitrite NEGATIVE NEGATIVE   Leukocytes, UA MODERATE (A) NEGATIVE  Urine microscopic-add on     Status: Abnormal   Collection Time: 07/11/14  2:43 PM  Result Value Ref Range   Squamous Epithelial / LPF FEW (A) RARE   WBC, UA 11-20 <3 WBC/hpf   RBC / HPF TOO NUMEROUS TO COUNT <3 RBC/hpf   Bacteria, UA FEW (A) RARE     HEENT: normal Cardio: RRR and no murmur Resp: CTA B/L and unlabored GI: BS positive and NT, ND Extremity:  Pulses positive and No Edema Skin:   Intact Neuro: Lethargic,unable to do MMT this am, increased tone Right pec and finger flexorsMusc/Skel:  Other no pain with AROM of limbs Gen NAD   Assessment/Plan: 1. 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 Start methylphenidate FIM: FIM - Bathing Bathing: 1: Two helpers  FIM - Upper Body Dressing/Undressing Upper body dressing/undressing: 1: Total-Patient completed less than 25% of tasks FIM - Lower Body Dressing/Undressing Lower body dressing/undressing steps patient completed:  (socks only no other public clothing) Lower body dressing/undressing: 1: Total-Patient completed less than 25% of tasks  FIM - Toileting Toileting: 1: Two helpers  FIM - Diplomatic Services operational officer Devices: Psychiatrist Transfers: 1-Two helpers  FIM -  Banker Devices: HOB elevated Bed/Chair Transfer: 3: Supine > Sit: Mod A (lifting assist/Pt. 50-74%/lift 2 legs, 1: Two helpers  FIM - Locomotion: Wheelchair Distance: 50 Locomotion: Wheelchair: 1: Total Assistance/staff pushes wheelchair (Pt<25%) FIM - Locomotion: Ambulation Locomotion: Ambulation Assistive Devices: Other (comment) ("three musketeer style) Ambulation/Gait Assistance: 1: +2 Total assist Locomotion: Ambulation: 0: Activity did not occur  Comprehension Comprehension Mode: Auditory Comprehension: 3-Understands basic 50 - 74% of the time/requires cueing 25 - 50%  of the time  Expression Expression Mode: Verbal Expression: 2-Expresses basic 25 - 49% of the time/requires cueing 50 - 75% of the time. Uses single words/gestures.  Social Interaction Social Interaction: 2-Interacts appropriately 25 - 49% of time - Needs frequent redirection.  Problem Solving Problem Solving: 2-Solves basic 25 - 49% of the time - needs direction more than half the time to initiate, plan or complete simple activities  Memory Memory: 1-Recognizes or recalls less than 25% of the time/requires cueing greater than 75% of the time   Medical Problem List and Plan: 1. 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 DVT Prophylaxis/Anticoagulation: Subcutaneous heparin for DVT prophylaxis initiated 07/06/2014 3. Pain Management: Tylenol as needed 4. Dysphagia. Dysphagia 3 thin liquids. Monitor for any signs of aspiration. Follow-up speech therapy.IVFs for hydration due to decreased nutritional storage and hydration 5. Neuropsych: This patient is capable of making decisions on his own behalf. 6. Skin/Wound Care: Routine skin checks 7. Fluids/Electrolytes/Nutrition: Routine I nose with follow-up chemistries 8. Hypertension. Lisinopril 20 mg daily. Monitor with increased mobility 9. Tobacco abuse,  counseling 10.  Elevated temp x1 UTI, + UA but UCx - ,empiric antibiotics BBC x 2 from 5/27 pending , WBC 5/30 were normal, CXR 5/27 without infiltrate, no recurrence , monitor LOS (Days) 6 A FACE TO FACE EVALUATION WAS PERFORMED  Taren Dymek E 07/13/2014, 7:55 AM

## 2014-07-13 NOTE — Progress Notes (Signed)
Social Work Assessment and Plan  Patient Details  Name: Michael Goodwin MRN: [id number] Date of Birth: [birth year]  Today's Date: 07/13/2014  Problem List:  Patient Active Problem List   Diagnosis Date Noted  . Alterations of sensations following CVA (cerebrovascular accident) 07/10/2014  . Acute respiratory failure with hypoxia 07/07/2014  . Abnormal urinalysis 07/07/2014  . Hypokalemia 07/07/2014  . Dehydration 07/07/2014  . Metabolic encephalopathy 37/11/6267  . Aortic root dilatation (48 mm May 2016) 07/07/2014  . Ascending aorta dilatation (68m May 2016) 07/07/2014  . ICH (intracerebral hemorrhage) 07/07/2014  . 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 Hemiplegia affecting right dominant side 07/06/2014  . Cognitive deficit due to recent stroke 07/06/2014  . IVH (intraventricular hemorrhage) 07/06/2014  . Essential hypertension 07/06/2014  . Intracranial hemorrhage    Past Medical History: No past medical history on file. Past Surgical History: No past surgical history on file. Social History:  reports that he has been smoking.  He does not have any smokeless tobacco history on file. He reports that he drinks alcohol. His drug history is not on file.  Family / Support Systems Marital Status: Widow/Widower Patient Roles: Parent Children: KGrant Ruts- dtr - [phone]   Tarboris Love - son - [phone] Supports: family friend who is like a son to pt Anticipated Caregiver: daughter, son, and family friend Ability/Limitations of Caregiver: dtr and son work, but they can coordinate work schedules between the two of them and the family friend Caregiver Availability: 24/7 Family Dynamics: pt's children and family friend are committed to caring for pt at home  Social History Preferred language: English Religion: Non-Denominational Education: dropped out at 7th grade Read: No 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  CSW will continue to follow and hope to assess further once pt is able to express himself better with CSW. Recent Psychosocial Issues: none reported, but pt's dtr mentioned applying pt for Medicaid in hopes that he would qualify for CAP or PCS. Psychiatric History: none reported Substance Abuse History: none reported  Patient / Family Perceptions, Expectations & Goals Pt/Family understanding of illness & functional limitations: Pt's dtr feels she has a good understanding of pt's condition, but CSW made her aware of the PA being available to answer questions, as needed. Premorbid pt/family roles/activities: Pt was independent PTA.  He was caring for himself and his home.  He didn't dirve, but family would take him places or bring him things. Anticipated changes in roles/activities/participation: Pt will need someone with him for hands on assistance 24/7. Pt/family expectations/goals: Not able to determine at this time.  WIll continue to determine this from pt.  Dtr just wants him to take advantage of all of the therapy he can get.  Community Resources CExpress Scripts None Premorbid Home Care/DME Agencies: None Transportation available at discharge: family Resource referrals recommended: Neuropsychology, Support group (specify)  Discharge Planning Living Arrangements: Alone Support Systems: Children, Other relatives, Friends/neighbors Type of Residence: Private residence Insurance Resources: MUnited AutoResources: SRadio broadcast assistantScreen Referred:  No Money Management: Patient Does the patient have any problems obtaining your medications?: No Home Management: Pt was taking care of  this on his own.  Children will assist now. Patient/Family Preliminary Plans: Pt's children and family friend plans to take shifts in order to assure someone is with pt all the time. Barriers to Discharge: Steps (but also has ramp) Social Work Anticipated Follow Up Needs: HH/OP, Support Group Expected length of stay: 3 weeks  Clinical Impression CSW briefly met pt and then completed assessment with pt's dtr over the phone due to pt's somnolence.  Also told dtr of role of CSW.  Pt did not have a primary care MD and so she wants for him to go to [facility].  CSW will work on this.  Dtr explained that she, her brother, and their family friend will be taking turns caring for pt so that he has 24/7 care.  Dtr wondered about Medicaid application and CSW told her to finish filling it out and turn it in to DSS in [place].  Also explained to her that it can take time to get approved and that there is a waiting list for CAP services, which is what she was inquiring about.  Pt may get PCS sooner, but explained that Medicaid needs to be approved first.  She expressed understanding.  CSW will continue to follow pt and assist pt/family with d/c planning.  Consepcion Utt, Silvestre Mesi 07/13/2014, 2:19 PM

## 2014-07-14 ENCOUNTER — Inpatient Hospital Stay (HOSPITAL_COMMUNITY): Payer: Medicare Other

## 2014-07-14 ENCOUNTER — Inpatient Hospital Stay (HOSPITAL_COMMUNITY): Payer: Medicare Other | Admitting: Speech Pathology

## 2014-07-14 ENCOUNTER — Ambulatory Visit (HOSPITAL_COMMUNITY): Payer: Medicare Other | Admitting: Rehabilitation

## 2014-07-14 ENCOUNTER — Inpatient Hospital Stay (HOSPITAL_COMMUNITY): Payer: Medicare Other | Admitting: Occupational Therapy

## 2014-07-14 LAB — CULTURE, BLOOD (ROUTINE X 2)
Culture: NO GROWTH
Culture: NO GROWTH

## 2014-07-14 NOTE — Progress Notes (Signed)
Physical Therapy Session Note  Patient Details  Name: Michael Goodwin MRN: [id number] Date of Birth: 04/13/1945  Today's Date: 07/14/2014 PT Co-Treatment Time: 1330 (whole session was 1300-1400 w/ OT)-1400 PT Co-Treatment Time Calculation (min): 30 min  Short Term Goals: Week 1:  PT Short Term Goal 1 (Week 1): patient will be able to perform supine to sit transfer with min A. PT Short Term Goal 2 (Week 1): Patient will be able to maintain sitting EOB with  min A and reduced R side push/lean. PT Short Term Goal 3 (Week 1): Patient will be able to perform sit to stand transfer with mod A and maintain standing with mod A for up to 1 min PT Short Term Goal 4 (Week 1): Patient will be able to perform transfer to w/c with mod A PT Short Term Goal 5 (Week 1): Patient will be able to ambulate with LRAD on a distance of 10 feet with mod A  Skilled Therapeutic Interventions/Progress Updates:   Skilled co-treat with OT to focus on NMR for postural control, neck and trunk extension, body awareness, trunk alignment at midline, visual attention to midline and to the right of midline. Pt transferred stand pivot to mat with max A/total A +2. Transitioned into prone position attempting to promote head trunk/ back extension and to help break up tone and facilitate extension when standing. Pt continued to get into somewhat SL position, therefore transitioned to sidelying on his right side for weight bearing in proper alignment; progressed to functional reaching with left UE to promote trunk extension and visual attention up and to the right. Assessed carry over of trunk/ neck extension and visual attention at midline into functional ambulation. Pt required +2 for facilitation at trunk and right hip for extension and A for advancement of right LE, as well as forward weight shift over RLE during stance. Continued focus on initiating a weight shift towards the left in functional ambulation an automatic task. Pt with  difficulty advancing his left LE (good leg) due to poor awareness of weight acceptance on right LE and body alignment at midline. Transitioned to stepping onto 6 inch step with left LE to work on initiating weight shifting and activation of right glut and hip mm with facilitation at the hip and knee.  Continues to require heavy +2A to maintain trunk alignment and RLE alignment.  Pt assisted back to nursing station, RN made aware of need to use restroom.    Therapy Documentation Precautions:  Precautions Precautions: Fall Restrictions Weight Bearing Restrictions: No   Vital Signs: Therapy Vitals Temp: 98 F (36.7 C) Temp Source: Oral Pulse Rate: (!) 55 Resp: 18 BP: (!) 161/58 mmHg Patient Position (if appropriate): Lying Oxygen Therapy SpO2: 97 % O2 Device: Not Delivered Pain: Pt with no c/o pain during session.   See FIM for current functional status  Therapy/Group: Co-Treatment  Lakoda Mcanany, Meribeth MattesEmily Ann 07/14/2014, 8:18 AM

## 2014-07-14 NOTE — Progress Notes (Signed)
Speech Language Pathology Daily Session Note  Patient Details  Name: Michael Goodwin MRN: [id number] Date of Birth: Jun 26, 1945  Today's Date: 07/14/2014 SLP Individual Time: 1005-1100 SLP Individual Time Calculation (min): 55 min  Short Term Goals: Week 1: SLP Short Term Goal 1 (Week 1): Pt will tolerate dysphagia 2 with nectar thick liquids using swallow strategies with min A given verbal/visual/tactile cueing. SLP Short Term Goal 2 (Week 1): Pt will sustain attention to functional tasks for at least 5 minutes with mod A given multimodal cueing. SLP Short Term Goal 3 (Week 1): Pt will be oriented x4 with visual external aids with max A given multimodal cueing. SLP Short Term Goal 4 (Week 1): Pt will recall biographical information with mod A given multimodal cueing. SLP Short Term Goal 5 (Week 1): Pt will demonstrate basic problem solving in functional tasks with max A given multimodal cueing.  Skilled Therapeutic Interventions:  Pt was seen for skilled ST targeting goals for dysphagia and cognition in addition to family education.  Upon arrival, pt was seated upright in wheelchair, asleep, but briefly awakened to voice and sternal rub.   Pt's daughter was present and remained actively engaged throughout therapy session.  SLP provided hand over hand assist to complete oral care prior to pt consuming nectar thick liquids.  Pt consumed nectar thick liquids with minimal overt s/s of aspiration.  He was noted with intermittently wet vocal quality both with and without PO intake.  Pt required max assist cues for cough to clear secretions and correct vocal quality.  SLP provided skilled education regarding rationale behind pt's currently prescribed diet and recommended swallowing precautions.  SLP also provided education regarding pt's current goals and progress in therapy, including pt's decreased arousal and its impact on his progress in therapies.  Pt's daughter verbalized understanding of education.   Continue per current plan of care.    FIM:  Comprehension Comprehension Mode: Auditory Comprehension: 1-Understands basic less than 25% of the time/requires cueing 75% of the time Expression Expression Mode: Verbal Expression: 2-Expresses basic 25 - 49% of the time/requires cueing 50 - 75% of the time. Uses single words/gestures. Social Interaction Social Interaction: 2-Interacts appropriately 25 - 49% of time - Needs frequent redirection. Problem Solving Problem Solving: 1-Solves basic less than 25% of the time - needs direction nearly all the time or does not effectively solve problems and may need a restraint for safety Memory Memory: 1-Recognizes or recalls less than 25% of the time/requires cueing greater than 75% of the time FIM - Eating Eating Activity: 4: Help with managing cup/glass  Pain Pain Assessment Pain Assessment: No/denies pain  Therapy/Group: Individual Therapy  Yolunda Kloos, Melanee SpryNicole L 07/14/2014, 4:21 PM

## 2014-07-14 NOTE — Progress Notes (Addendum)
Occupational Therapy Session Note  Patient Details  Name: Michael Goodwin MRN: [id number] Date of Birth: May 07, 1945  Today's Date: 07/14/2014 OT Individual Time: 1400-1430  OT Individual Time Calculation (min): 30min    Short Term Goals: Week 1:  OT Short Term Goal 1 (Week 1): pt. will feed self with minimal assist with LUE OT Short Term Goal 2 (Week 1): Pt will maintain static sitting balance for 10 minutes unsupported OT Short Term Goal 3 (Week 1): Pt. will groom self with mod assist OT Short Term Goal 4 (Week 1): Pt. will bathe self with max assist OT Short Term Goal 5 (Week 1): [facility]. [facility]  Skilled Therapeutic Interventions/Progress Updates:    1:1 Co treat with physical therapy. NMR: focus on promoting postural control, neck and trunk extension, body awareness, trunk alignment at midline, visual attention to midline and to the right of midline. Pt transferred stand pivot to mat with max A +2. Transitioned into prone position attempting to promote head trunk/ back extension and to help break up tone. Then transitioned to sidelying on his right side for weight bearing in proper alignment; progressed to functional reaching with left UE to promote trunk extension and visual attention up and to the right. Assessed carry over of trunk/ neck extension and visual attention at midline into functional ambulation. Pt required +2 for facilitation at trunk and right hip for extension and A for advancement of right LE. Continued focus on initiating a weight shift towards the left in functional ambulation an automatic task. Pt with difficulty advancing his left LE (good leg) due to poor awareness of weight acceptance on right LE and body alignment at midline. Transitioned to stepping onto 6 inch step with left Le to work on initiating weight shifting and activation of right glut and hip mm with facilitation at the hip and knee.   Therapy Documentation Precautions:   Precautions Precautions: Fall Restrictions Weight Bearing Restrictions: No Pain: No c/o pain in session  See FIM for current functional status  Therapy/Group: Individual Therapy/ co treat  [name], [name] [facility] 07/14/2014, 4:04 PM

## 2014-07-14 NOTE — Progress Notes (Signed)
CC unable to voice Subjective/Complaints: Pt having breakfast with NA but she stepped away Pt lethargic but eyes are open , and pt engages briefly in conversation before closing eyes   Review of Systems - limited due to mental status Pt denies itching, rashes, + knee pain, no breathing issues Neurological ROS: positive for - confusion, impaired coordination/balance, memory loss, weakness and poor level of alertness  Objective: Vital Signs: Blood pressure 161/58, pulse 55, temperature 98 F (36.7 C), temperature source Oral, resp. rate 18, height 6' (1.829 m), weight 68.6 kg (151 lb 3.8 oz), SpO2 97 %. No results found. Results for orders placed or performed during the hospital encounter of 07/07/14 (from the past 72 hour(s))  Urine culture     Status: None   Collection Time: 07/11/14  2:38 PM  Result Value Ref Range   Specimen Description URINE, CLEAN CATCH    Special Requests NONE    Colony Count NO GROWTH Performed at [facility]     Culture NO GROWTH Performed at [facility]     Report Status 07/12/2014 FINAL   Urinalysis, Routine w reflex microscopic (not at [facility])     Status: Abnormal   Collection Time: 07/11/14  2:43 PM  Result Value Ref Range   Color, Urine AMBER (A) YELLOW    Comment: BIOCHEMICALS MAY BE AFFECTED BY COLOR   APPearance CLOUDY (A) CLEAR   Specific Gravity, Urine 1.015 1.005 - 1.030   pH 5.0 5.0 - 8.0   Glucose, UA NEGATIVE NEGATIVE mg/dL   Hgb urine dipstick LARGE (A) NEGATIVE   Bilirubin Urine SMALL (A) NEGATIVE   Ketones, ur 15 (A) NEGATIVE mg/dL   Protein, ur NEGATIVE NEGATIVE mg/dL   Urobilinogen, UA 1.0 0.0 - 1.0 mg/dL   Nitrite NEGATIVE NEGATIVE   Leukocytes, UA MODERATE (A) NEGATIVE  Urine microscopic-add on     Status: Abnormal   Collection Time: 07/11/14  2:43 PM  Result Value Ref Range   Squamous Epithelial / LPF FEW (A) RARE   WBC, UA 11-20 <3 WBC/hpf   RBC / HPF TOO NUMEROUS TO COUNT <3 RBC/hpf   Bacteria, UA FEW  (A) RARE     HEENT: normal Cardio: RRR and no murmur Resp: CTA B/L and unlabored GI: BS positive and NT, ND Extremity:  Pulses positive and No Edema Skin:   Intact Neuro: Lethargic,limitedMMT this am,2- Right knee ext, trace R biceps flexion, 4- in LUE and LLE   increased tone  and finger flexorsMusc/Skel:  Non knee joint welling or tenderness Other no pain with PROM of limbs Gen NAD   Assessment/Plan: 1. 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   Pt with persistent somnolence mainly due to [facility] location in thalamus affecting reticular activating system increase methylphenidate FIM: FIM - Bathing Bathing: 1: Total-Patient completes 0-2 of 10 parts or less than 25%  FIM - Upper Body Dressing/Undressing Upper body dressing/undressing: 1: Total-Patient completed less than 25% of tasks FIM - Lower Body Dressing/Undressing Lower body dressing/undressing steps patient completed:  (socks only no other public clothing) Lower body dressing/undressing: 1: Two helpers  FIM - Toileting Toileting: 1: Two helpers  FIM - Diplomatic Services operational officer Devices: Psychiatrist Transfers: 1-Two helpers  FIM - Banker Devices: HOB elevated Bed/Chair Transfer: 3: Supine >  Sit: Mod A (lifting assist/Pt. 50-74%/lift 2 legs, 1: Two helpers  FIM - Locomotion: Wheelchair Distance: 50 Locomotion: Wheelchair: 1: Total Assistance/staff pushes wheelchair (Pt<25%) FIM - Locomotion: Ambulation Locomotion: Ambulation Assistive Devices: Other (comment) ("three musketeer style) Ambulation/Gait Assistance: 1: +2 Total assist Locomotion: Ambulation: 0: Activity did not occur  Comprehension Comprehension Mode: Auditory Comprehension:  1-Understands basic less than 25% of the time/requires cueing 75% of the time  Expression Expression Mode: Verbal Expression: 1-Expresses basis less than 25% of the time/requires cueing greater than 75% of the time.  Social Interaction Social Interaction: 1-Interacts appropriately less than 25% of the time. May be withdrawn or combative.  Problem Solving Problem Solving: 1-Solves basic less than 25% of the time - needs direction nearly all the time or does not effectively solve problems and may need a restraint for safety  Memory Memory: 1-Recognizes or recalls less than 25% of the time/requires cueing greater than 75% of the time   Medical Problem List and Plan: 1. 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 DVT Prophylaxis/Anticoagulation: Subcutaneous heparin for DVT prophylaxis initiated 07/06/2014 3. Pain Management: Tylenol as needed 4. Dysphagia. Dysphagia 3 thin liquids. Monitor for any signs of aspiration. Follow-up speech therapy.IVFs for hydration due to decreased nutritional storage and hydration 5. Neuropsych: This patient is capable of making decisions on his own behalf. 6. Skin/Wound Care: Routine skin checks 7. Fluids/Electrolytes/Nutrition: Routine I nose with follow-up chemistries 8. Hypertension. Lisinopril 20 mg daily. Monitor with increased mobility 9. Tobacco abuse, counseling 10.  Elevated temp x1 no recurrence cx - LOS (Days) 7 A FACE TO FACE EVALUATION WAS PERFORMED  Deanza Upperman E 07/14/2014, 7:51 AM

## 2014-07-14 NOTE — Progress Notes (Signed)
Occupational Therapy Weekly Progress Note  Patient Details  Name: Michael Goodwin MRN: [id number] Date of Birth: 02/18/1945  Beginning of progress report period: Jul 08, 2014 End of progress report period: July 14, 2014  Today's Date: 07/14/2014 OT Individual Time: 0800-0900 and 1530-1559 OT Individual Time Calculation (min): 60 min and 27 min    Patient has met 0 of 5 short term goals. Pt has been making very slow progress towards OT goals this week. Pt has been very lethargic during sessions but appears to be more alert today secondary to change in medications. Pt currently required +2 for self care tasks as well as transfers secondary to safety. Pt will continue to benefit from OT services.   Patient continues to demonstrate the following deficits: decreased I in self care, decreased awareness, cognition, functional strength and ROM in R UE/LE, decrease balance, functional transfer/mobility decreased postural control and therefore will continue to benefit from skilled OT intervention to enhance overall performance with BADL.  Patient progressing toward long term goals.  Continue plan of care.  OT Short Term Goals Week 2:  OT Short Term Goal 1 (Week 2): Pt will perform bathing with max A at sink side or seated EOB.  OT Short Term Goal 2 (Week 2): Pt will perform toilet transfer with max A of 1 to [facility] in order to decrease level of assist for functional transfers. OT Short Term Goal 3 (Week 2): Pt will engage in 5 minutes of static sitting balance with mod A in order to increase balance for functional tasks. OT Short Term Goal 4 (Week 2): Pt will perform UB dressing with mod A in order to increase I in self care.   Skilled Therapeutic Interventions/Progress Updates:  Session 1: Upon entering the room, pt supine in bed with no c/o pain. LB dressing performed supine in bed with pt initiating roll to L and R for LB clothing management. Pt still requiring mod- max A for rolling in both directions.  Sit >supine with max A to EOB. Pt required max A for dynamic sitting balance while attempting to don pull over shirt. Pt able to thread L sleeve independently this session. Max A stand pivot transfer with second helper utilized to hold wheelchair for safety. Pt seated in wheelchair and requesting to finish eating breakfast. Therapist scooped magic cup contents onto spoon and placed in patient's L hand. He was able to initiate bring spoon to mouth to feed self. Pt did not drop spoon from L grip during task. RN arrived to give medications at end of session. Therapist exited the room with RN remaining present.   Session 2: Pt received at RN station seated in wheelchair. Pt with no c/o pain this session but appearing more alert. Pt propelled into room via total A from therapist. Pt washed food from clothing utilizing mirror for feedback to R side of face. Pt transferred back into bed via total A as second helper needed for safety. Pt impulsive and attempting to stand/throw self towards bed during transfer. Sit >supine with total A. RN arrived to check bandages. Therapist removed shoes and socks with total A. OT educated and demonstrated use of call bell but pt unable to press it himself even after max verbal cues. Bed alarm activated prior to exiting the room.   Therapy Documentation Precautions:  Precautions Precautions: Fall Restrictions Weight Bearing Restrictions: No Vital Signs: Therapy Vitals Temp: 98.4 F (36.9 C) Temp Source: Axillary Pulse Rate: 94 Resp: 18 BP: 125/83 mmHg Patient Position (  if appropriate): Sitting Oxygen Therapy SpO2: 94 % O2 Device: Not Delivered Pain: Pain Assessment Pain Assessment: No/denies pain  See FIM for current functional status  Therapy/Group: Individual Therapy  Phineas Semen 07/14/2014, 5:08 PM

## 2014-07-15 ENCOUNTER — Inpatient Hospital Stay (HOSPITAL_COMMUNITY): Payer: Medicare Other | Admitting: Occupational Therapy

## 2014-07-15 ENCOUNTER — Inpatient Hospital Stay (HOSPITAL_COMMUNITY): Payer: Medicare Other | Admitting: Speech Pathology

## 2014-07-15 DIAGNOSIS — I10 Essential (primary) hypertension: Secondary | ICD-10-CM

## 2014-07-15 NOTE — Plan of Care (Signed)
Problem: RH BLADDER ELIMINATION Goal: RH STG MANAGE BLADDER WITH ASSISTANCE STG Manage Bladder With min. Assistance  Outcome: Not Progressing Condom cath for incontinence

## 2014-07-15 NOTE — Progress Notes (Signed)
Occupational Therapy Session Note  Patient Details  Name: Marguarite ArbourClyde Espana MRN: [id number] Date of Birth: [birth year]  Today's Date: 07/15/2014 OT Individual Time: [id number] OT Individual Time Calculation (min): 55 min    Short Term Goals: Week 2:  OT Short Term Goal 1 (Week 2): Pt will perform bathing with max A at sink side or seated EOB.  OT Short Term Goal 2 (Week 2): Pt will perform [facility] to decrease level of assist for functional transfers. OT Short Term Goal 3 (Week 2): Pt will engage in 5 minutes of static sitting balance with mod A in order to increase balance for functional tasks. OT Short Term Goal 4 (Week 2): Pt will perform UB dressing with mod A in order to increase I in self care.   Skilled Therapeutic Interventions/Progress Updates:    Pt received seated in wheelchair at RN station. Pt with no c/o pain this session. Pt engaged in bathing and dressing with NT prior to this therapy session. Therapist provided total A to don B socks and shoes. PROM x 5 reps to R UE digits,wrist, elbow, and shoulder in all planes. Pt reporting he was hungry during session. Pt seated in wheelchair at table for feeding. Pt unable to scoop food into utensil and take from container. However, if spoon placed into L hand pt initiated bringing food to mouth until all food gone from spoon. Hand over hand assistance to pick cup up from table with L hand. Pt able to independently bring to mouth but requiring min verbal cues to take small sips of fluid. Pt then returned to room for oral care. Pt also initiated holding toothbrush to clean teeth but therapist assisted for thoroughness. Pt returned to RN station reclined in chair and with QRB donned.   Therapy Documentation Precautions:  Precautions Precautions: Fall Restrictions Weight Bearing Restrictions: No See FIM for current functional status  Therapy/Group: Individual Therapy  Lowella Gripittman, Daveion Robar L 07/15/2014, 9:58  AM

## 2014-07-15 NOTE — Progress Notes (Signed)
Speech Language Pathology Daily Session Note  Patient Details  Name: Marguarite ArbourClyde Basilio MRN: [id number] Date of Birth: 1945/07/20  Today's Date: 07/15/2014 SLP Individual Time: 1500-1525 SLP Individual Time Calculation (min): 25 min  Short Term Goals: Week 1: SLP Short Term Goal 1 (Week 1): Pt will tolerate dysphagia 2 with nectar thick liquids using swallow strategies with min A given verbal/visual/tactile cueing. SLP Short Term Goal 2 (Week 1): Pt will sustain attention to functional tasks for at least 5 minutes with mod A given multimodal cueing. SLP Short Term Goal 3 (Week 1): Pt will be oriented x4 with visual external aids with max A given multimodal cueing. SLP Short Term Goal 4 (Week 1): Pt will recall biographical information with mod A given multimodal cueing. SLP Short Term Goal 5 (Week 1): Pt will demonstrate basic problem solving in functional tasks with max A given multimodal cueing.  Skilled Therapeutic Interventions:  Pt was seen for skilled ST targeting cognitive goals.  Upon arrival, pt was asleep, reclined in bed.  Pt was arousable to voice and light touch and was able to maintain alertness for 30 second-1 minute intervals.  SLP repositioned pt to be sitting as upright as possible to maximize attention and alertness during therapeutic tasks.  SLP provided oral care to promote oral hygiene as pt has been intermittently noted with poor management of his secretions due to lethargy.  Pt was oriented to place and situation with choice of two and max assist verbal cues.  He responded to questions appropriately in <25% of opportunities and his verbal output remained mostly unintelligible.  Pt frequently requested to go back to bed and didn't appear to realize that he was in bed.  Pt was left in bed with bed alarm activated, call bell left within reach.      FIM:  Comprehension Comprehension Mode: Auditory Comprehension: 2-Understands basic 25 - 49% of the time/requires cueing 51 - 75% of  the time Expression Expression Mode: Verbal Expression: 2-Expresses basic 25 - 49% of the time/requires cueing 50 - 75% of the time. Uses single words/gestures. Social Interaction Social Interaction: 2-Interacts appropriately 25 - 49% of time - Needs frequent redirection. Problem Solving Problem Solving: 1-Solves basic less than 25% of the time - needs direction nearly all the time or does not effectively solve problems and may need a restraint for safety Memory Memory: 1-Recognizes or recalls less than 25% of the time/requires cueing greater than 75% of the time  Pain Pain Assessment Pain Assessment: No/denies pain  Therapy/Group: Individual Therapy  Jeris Roser, Melanee SpryNicole L 07/15/2014, 4:31 PM

## 2014-07-15 NOTE — Progress Notes (Signed)
Michael Goodwin is a 69 y.o. male June 06, 1945 [id number]  Subjective: No new complaints. No new problems. Slept OK.  Objective: Vital signs in last 24 hours: Temp:  [98.4 F (36.9 C)-98.9 F (37.2 C)] 98.9 F (37.2 C) (06/04 0554) Pulse Rate:  [75-94] 75 (06/04 0554) Resp:  [18] 18 (06/04 0554) BP: (125-162)/(83-85) 162/85 mmHg (06/04 0554) SpO2:  [94 %-99 %] 99 % (06/04 0554) Weight:  [150 lb 12.7 oz (68.4 kg)] 150 lb 12.7 oz (68.4 kg) (06/03 1546) Weight change:  Last BM Date: 07/12/14  Intake/Output from previous day: 06/03 0701 - 06/04 0700 In: 1141.3 [P.O.:240; I.V.:901.3] Out: 350 [Urine:350]  Physical Exam General: No apparent distress    Lungs: Normal effort. Lungs clear to auscultation, no crackles or wheezes. Cardiovascular: Regular rate and rhythm, no edema Neurological: No new neurological deficits  Lab Results: BMET    Component Value Date/Time   NA 135 07/10/2014 0605   K 3.5 07/10/2014 0605   CL 98* 07/10/2014 0605   CO2 26 07/10/2014 0605   GLUCOSE 93 07/10/2014 0605   BUN 10 07/10/2014 0605   CREATININE 1.11 07/10/2014 0605   CALCIUM 9.5 07/10/2014 0605   GFRNONAA >60 07/10/2014 0605   GFRAA >60 07/10/2014 0605   CBC    Component Value Date/Time   WBC 7.5 07/10/2014 0605   RBC 4.56 07/10/2014 0605   HGB 13.7 07/10/2014 0605   HCT 40.7 07/10/2014 0605   PLT 168 07/10/2014 0605   MCV 89.3 07/10/2014 0605   MCH 30.0 07/10/2014 0605   MCHC 33.7 07/10/2014 0605   RDW 13.3 07/10/2014 0605   LYMPHSABS 1.2 07/10/2014 0605   MONOABS 0.2 07/10/2014 0605   EOSABS 0.2 07/10/2014 0605   BASOSABS 0.0 07/10/2014 0605   CBG's (last 3):  No results for input(s): GLUCAP in the last 72 hours. LFT's Lab Results  Component Value Date   ALT 19 07/10/2014   AST 25 07/10/2014   ALKPHOS 64 07/10/2014   BILITOT 0.8 07/10/2014    Studies/Results: No results found.  Medications:  I have reviewed the patient's current medications. Scheduled  Medications: . amLODipine  5 mg Oral Daily  . heparin subcutaneous  5,000 Units Subcutaneous 3 times per day  . lisinopril  20 mg Oral BID  . methylphenidate  5 mg Oral BID WC  . mirtazapine  7.5 mg Oral QHS  . pantoprazole sodium  40 mg Oral Daily  . senna-docusate  1 tablet Oral BID   PRN Medications: acetaminophen, ondansetron **OR** ondansetron (ZOFRAN) IV, RESOURCE THICKENUP CLEAR, sorbitol  Assessment/Plan: Principal Problem:   Nontraumatic thalamic hemorrhage Active Problems:   Hemiplegia affecting right dominant side   Cognitive deficit due to recent stroke   Alterations of sensations following CVA (cerebrovascular accident)  1. 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 DVT Prophylaxis/Anticoagulation: Subcutaneous heparin for DVT prophylaxis initiated 07/06/2014 3. Pain Management: Tylenol as needed 4. Dysphagia. Dysphagia 3 thin liquids. Monitor for any signs of aspiration. Follow-up speech therapy. IVFs for hydration 5. Neuropsych: This patient is capable of making decisions on his own behalf. 6. Skin/Wound Care: Routine skin checks 7. Fluids/Electrolytes/Nutrition: Routine I &Os with follow-up chemistries prn 8. Hypertension. Lisinopril 20 mg daily. Monitor with increased mobility 9. Tobacco abuse, counseling  Length of stay, days: 8   Vy Badley A. Felicity CoyerLeschber, MD 07/15/2014, 11:51 AM

## 2014-07-16 ENCOUNTER — Inpatient Hospital Stay (HOSPITAL_COMMUNITY): Payer: Medicare Other | Admitting: Physical Therapy

## 2014-07-16 MED ORDER — CETYLPYRIDINIUM CHLORIDE 0.05 % MT LIQD
7.0000 mL | Freq: Two times a day (BID) | OROMUCOSAL | Status: DC
  Administered 2014-07-16 – 2014-08-01 (×31): 7 mL via OROMUCOSAL

## 2014-07-16 NOTE — Progress Notes (Signed)
Michael Goodwin is a 69 y.o. male [birth year] [id number]  Subjective: No new complaints. No new problems. Slept OK.  Objective: Vital signs in last 24 hours: Temp:  [98 F (36.7 C)] 98 F (36.7 C) (06/05 0605) Pulse Rate:  [66] 66 (06/05 0605) Resp:  [18] 18 (06/05 0605) BP: (140-160)/(60-92) 160/92 mmHg (06/05 0605) SpO2:  [96 %] 96 % (06/05 0605) Weight change:  Last BM Date: 07/12/14  Intake/Output from previous day: 06/04 0701 - 06/05 0700 In: 720 [P.O.:720] Out: 650 [Urine:650]  Physical Exam General: No apparent distress    Lungs: Normal effort. Lungs clear to auscultation, no crackles or wheezes. Cardiovascular: Regular rate and rhythm, no edema Neurological: No new neurological deficits  Lab Results: BMET    Component Value Date/Time   NA 135 07/10/2014 0605   K 3.5 07/10/2014 0605   CL 98* 07/10/2014 0605   CO2 26 07/10/2014 0605   GLUCOSE 93 07/10/2014 0605   BUN 10 07/10/2014 0605   CREATININE 1.11 07/10/2014 0605   CALCIUM 9.5 07/10/2014 0605   GFRNONAA >60 07/10/2014 0605   GFRAA >60 07/10/2014 0605   CBC    Component Value Date/Time   WBC 7.5 07/10/2014 0605   RBC 4.56 07/10/2014 0605   HGB 13.7 07/10/2014 0605   HCT 40.7 07/10/2014 0605   PLT 168 07/10/2014 0605   MCV 89.3 07/10/2014 0605   MCH 30.0 07/10/2014 0605   MCHC 33.7 07/10/2014 0605   RDW 13.3 07/10/2014 0605   LYMPHSABS 1.2 07/10/2014 0605   MONOABS 0.2 07/10/2014 0605   EOSABS 0.2 07/10/2014 0605   BASOSABS 0.0 07/10/2014 0605   CBG's (last 3):  No results for input(s): GLUCAP in the last 72 hours. LFT's Lab Results  Component Value Date   ALT 19 07/10/2014   AST 25 07/10/2014   ALKPHOS 64 07/10/2014   BILITOT 0.8 07/10/2014    Studies/Results: No results found.  Medications:  I have reviewed the patient's current medications. Scheduled Medications: . amLODipine  5 mg Oral Daily  . antiseptic oral rinse  7 mL Mouth Rinse BID  . heparin subcutaneous  5,000 Units  Subcutaneous 3 times per day  . lisinopril  20 mg Oral BID  . methylphenidate  5 mg Oral BID WC  . mirtazapine  7.5 mg Oral QHS  . pantoprazole sodium  40 mg Oral Daily  . senna-docusate  1 tablet Oral BID   PRN Medications: acetaminophen, ondansetron **OR** ondansetron (ZOFRAN) IV, RESOURCE THICKENUP CLEAR, sorbitol  Assessment/Plan: Principal Problem:   Nontraumatic thalamic hemorrhage Active Problems:   Hemiplegia affecting right dominant side   Cognitive deficit due to recent stroke   Alterations of sensations following CVA (cerebrovascular accident)  1. 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 DVT Prophylaxis/Anticoagulation: Subcutaneous heparin for DVT prophylaxis initiated 07/06/2014 3. Pain Management: Tylenol as needed 4. Dysphagia. Dysphagia 3 thin liquids. Monitor for any signs of aspiration. Follow-up speech therapy. IVFs for hydration 5. Neuropsych: This patient is capable of making decisions on his own behalf. 6. Skin/Wound Care: Routine skin checks 7. Fluids/Electrolytes/Nutrition: Routine I &Os with follow-up chemistries prn 8. Hypertension. Lisinopril 20 mg daily. Monitor with increased mobility 9. Tobacco abuse, counseling  Length of stay, days: 9   Valerie A. Felicity CoyerLeschber, MD 07/16/2014, 8:47 AM

## 2014-07-16 NOTE — Progress Notes (Signed)
Physical Therapy Session Note  Patient Details  Name: Michael ArbourClyde Brumbach MRN: [id number] Date of Birth: December 05, 1945  Today's Date: 07/16/2014 PT Individual Time: 1330-1400 PT Individual Time Calculation (min): 30 min   Short Term Goals: Week 1:  PT Short Term Goal 1 (Week 1): patient will be able to perform supine to sit transfer with min A. PT Short Term Goal 2 (Week 1): Patient will be able to maintain sitting EOB with  min A and reduced R side push/lean. PT Short Term Goal 3 (Week 1): Patient will be able to perform sit to stand transfer with mod A and maintain standing with mod A for up to 1 min PT Short Term Goal 4 (Week 1): Patient will be able to perform transfer to w/c with mod A PT Short Term Goal 5 (Week 1): Patient will be able to ambulate with LRAD on a distance of 10 feet with mod A  Skilled Therapeutic Interventions/Progress Updates:  Pt was seen bedside in the pm. Pt transported supine to edge of bed with side rail, head of bed elevated and max A. Pt tolerated edge of bed about 10 minutes with S to min A. Pt transferred edge of bed to supine with max A. Pt rolled R/L with side rail and max A. Pt transferred supine to edge of bed with max A. Pt transferred edge of bed to w/c with max A. Pt left sitting up in tilt in space w/c with quick release belt in place at nurses station.    Therapy Documentation Precautions:  Precautions Precautions: Fall Restrictions Weight Bearing Restrictions: No General:   Vital Signs:  Pain: No c/o pain.   See FIM for current functional status  Therapy/Group: Individual Therapy  Rayford HalstedMitchell, Kamaya Keckler G 07/16/2014, 2:11 PM

## 2014-07-17 ENCOUNTER — Inpatient Hospital Stay (HOSPITAL_COMMUNITY): Payer: Medicare Other | Admitting: Rehabilitation

## 2014-07-17 ENCOUNTER — Inpatient Hospital Stay (HOSPITAL_COMMUNITY): Payer: Medicare Other | Admitting: Occupational Therapy

## 2014-07-17 ENCOUNTER — Inpatient Hospital Stay (HOSPITAL_COMMUNITY): Payer: Medicare Other | Admitting: Speech Pathology

## 2014-07-17 MED ORDER — AMLODIPINE BESYLATE 10 MG PO TABS
10.0000 mg | ORAL_TABLET | Freq: Every day | ORAL | Status: DC
  Administered 2014-07-18 – 2014-07-22 (×5): 10 mg via ORAL
  Filled 2014-07-17 (×6): qty 1

## 2014-07-17 NOTE — Progress Notes (Signed)
Speech Language Pathology Weekly Progress and Session Note  Patient Details  Name: Michael Goodwin MRN: [id number] Date of Birth: 06/26/1945  Beginning of progress report period: May 30,2016 End of progress report period: July 17, 2014  Today's Date: 07/17/2014 SLP Individual Time: 0800-0900 SLP Individual Time Calculation (min): 60 min  Short Term Goals: Week 1: SLP Short Term Goal 1 (Week 1): Pt will tolerate dysphagia 2 with nectar thick liquids using swallow strategies with min A given verbal/visual/tactile cueing. SLP Short Term Goal 1 - Progress (Week 1): Revised due to lack of progress SLP Short Term Goal 2 (Week 1): Pt will sustain attention to functional tasks for at least 5 minutes with mod A given multimodal cueing. SLP Short Term Goal 2 - Progress (Week 1): Revised due to lack of progress SLP Short Term Goal 3 (Week 1): Pt will be oriented x4 with visual external aids with max A given multimodal cueing. SLP Short Term Goal 3 - Progress (Week 1): Progressing toward goal SLP Short Term Goal 4 (Week 1): Pt will recall biographical information with mod A given multimodal cueing. SLP Short Term Goal 4 - Progress (Week 1): Revised due to lack of progress SLP Short Term Goal 5 (Week 1): Pt will demonstrate basic problem solving in functional tasks with max A given multimodal cueing. SLP Short Term Goal 5 - Progress (Week 1): Progressing toward goal    New Short Term Goals: Week 2: SLP Short Term Goal 1 (Week 2): Pt will tolerate dysphagia 1 with nectar thick liquids using swallow strategies with max A  verbal/visual/tactile cueing. SLP Short Term Goal 2 (Week 2): Pt will focus attention to functional tasks for 30 seconds with max A  multimodal cueing. SLP Short Term Goal 3 (Week 2): Pt will be oriented x4 with visual external aids with max A multimodal cueing. SLP Short Term Goal 4 (Week 2): Pt will recall biographical information with max A multimodal cueing. SLP Short Term Goal 5  (Week 2): Pt will demonstrate basic problem solving in functional tasks with max A  multimodal cueing.  Weekly Progress Updates:  Pt made limited functional gains this reporting period and has met 0 out of 5 short term goals.  Pt continues to require max to total assist for basic, familiar cognitive tasks due to decreased alertness, poor orientation, poor initiation and sequencing, and decreased intellectual awareness of deficits.  Pt's diet was downgraded to dys 1 textures due to lethargy which resulted in difficulty masticating dys 2 solids.  Pt is consuming his currently prescribed diet with max assist verbal cues for use of swallowing precautions.  Pt would continue to benefit from skilled ST while inpatient in order to maximize functional independence and reduce burden of care prior to discharge.  Pt and family education is ongoing.  Anticipate that pt will need 24/7 supervision and ST follow up at next level of care at discharge.     Intensity: Minumum of 1-2 x/day, 30 to 90 minutes Frequency: 3 to 5 out of 7 days Duration/Length of Stay: 18-21 days Treatment/Interventions: Cognitive remediation/compensation;Cueing hierarchy;Dysphagia/aspiration precaution training;Functional tasks;Environmental controls;Internal/external aids;Medication managment;Multimodal communication approach;Patient/family education;Speech/Language facilitation;Oral motor exercises   Daily Session  Skilled Therapeutic Interventions: Pt was seen for skilled ST targeting goals for dysphagia and cognition.  Upon arrival, pt was partially reclined in bed, awake, confused, and required cues to focus attention on SLP during structured tasks.  SLP facilitated the session with max assist verbal cues to reorient to place, total assist to  reorient to situation.  Pt initiated and sequenced self feeding tasks with max assist multimodal cues due to decreased focused attention and fluctuating alertness.  Pt consumed dys 1 textures and  nectar thick liquids with mod-max assist multimodal cues for use of swallowing precautions.  Pt was noted with suspected delayed swallow which likely caused delayed cough x2 during the course of his breakfast meal.  Goals updated on this date to reflect current progress and plan of care.           FIM:  Comprehension Comprehension Mode: Auditory Comprehension: 2-Understands basic 25 - 49% of the time/requires cueing 51 - 75% of the time Expression Expression Mode: Verbal Expression: 2-Expresses basic 25 - 49% of the time/requires cueing 50 - 75% of the time. Uses single words/gestures. Social Interaction Social Interaction: 2-Interacts appropriately 25 - 49% of time - Needs frequent redirection. Problem Solving Problem Solving: 2-Solves basic 25 - 49% of the time - needs direction more than half the time to initiate, plan or complete simple activities Memory Memory: 1-Recognizes or recalls less than 25% of the time/requires cueing greater than 75% of the time FIM - Eating Eating Activity: 4: Help with picking up utensils;4: Helper occasionally scoops food on utensil;5: Needs verbal cues/supervision;4: Helper checks for pocketed food General    Pain Pain Assessment Pain Assessment: No/denies pain Pain Score: 0-No pain  Therapy/Group: Individual Therapy  Laelynn Blizzard, Selinda Orion 07/17/2014, 12:30 PM

## 2014-07-17 NOTE — Progress Notes (Signed)
Physical Therapy Weekly Progress Note  Patient Details  Name: Michael Goodwin MRN: [id number] Date of Birth: 1945-09-28  Beginning of progress report period: Jul 08, 2014 End of progress report period: July 17, 2014  Today's Date: 07/17/2014 PT Individual Time: 1300-1355 PT Individual Time Calculation (min): 55 min   Patient has met 0 of 5 short term goals.  Pt making very little gains towards mobility or cognition during this reporting period.  Continues to be very fatigued and lethargic during all sessions, cannot keep eyes open, and presents with severe cognitive deficits, limited mobility.  Have began to note increased flexor tone in RLE with standing as well as severe pushing to the R in sitting and standing.  Feel that pt will need a lot of hands on care at home and am unsure if home will be safe at time of D/C.   Patient continues to demonstrate the following deficits: decreased balance, decreased alertness, decreased awareness, pusher tendencies, decreased functional use of RUE/LE and therefore will continue to benefit from skilled PT intervention to enhance overall performance with activity tolerance, balance, postural control, ability to compensate for deficits, functional use of  right upper extremity and right lower extremity, attention, awareness, coordination and knowledge of precautions.  Patient progressing toward long term goals..  Continue plan of care. (may downgrade pending progress next period)  PT Short Term Goals Week 1:  PT Short Term Goal 1 (Week 1): patient will be able to perform supine to sit transfer with min A. PT Short Term Goal 1 - Progress (Week 1): Progressing toward goal PT Short Term Goal 2 (Week 1): Patient will be able to maintain sitting EOB with  min A and reduced R side push/lean. PT Short Term Goal 2 - Progress (Week 1): Progressing toward goal PT Short Term Goal 3 (Week 1): Patient will be able to perform sit to stand transfer with mod A and maintain  standing with mod A for up to 1 min PT Short Term Goal 3 - Progress (Week 1): Progressing toward goal PT Short Term Goal 4 (Week 1): Patient will be able to perform transfer to w/c with mod A PT Short Term Goal 4 - Progress (Week 1): Progressing toward goal PT Short Term Goal 5 (Week 1): Patient will be able to ambulate with LRAD on a distance of 10 feet with mod A PT Short Term Goal 5 - Progress (Week 1): Progressing toward goal Week 2:  PT Short Term Goal 1 (Week 2): Pt will perform bed mobility with mod A with 75% cues to attend to RUE/LE PT Short Term Goal 2 (Week 2): Pt will perform functional transfers R and L at mod A level with 75% cues for technique PT Short Term Goal 3 (Week 2): Pt will perform sit<>stand with mod A with 75% cues to attend to L environment PT Short Term Goal 4 (Week 2): Pt will perform dynamic standing tasks at max A level for 3 mins to increase independence with ADLs.  PT Short Term Goal 5 (Week 2): Pt will ambulate x 10' with LRAD and +2A for safety with pt assist approx 30%.   Skilled Therapeutic Interventions/Progress Updates:   Pt received sitting in w/c in room, nurse tech assisting with lunch.  Pt stating "yes" when asked if wanted to finish, therefore assisted into therapy gym via w/c at total A level.  Transferred to/from therapy mat via squat pivot to the R each time with +2A with heavy assist for forward weight  shift and WB through LEs with max A to prevent LUE from pushing.  While seated on EOM, addressed alertness, attention, midline orientation, scanning R environment and unsupported sitting balance with eating lunch.  Pt able to initiate less than 25% of time during task with max verbal and total HOH cues for initiating with LUE.  Pt then stating "no" when asked if he wanted more food.  Remainder of session continued to focus on deficits mentioned above with PT asking orientation questions, however pt either not able to state clearly enough to be understood or  did not know answer.  He is very unaware of deficits and when told he had a stroke, pt states "no."  Progressed to reaching tasks to the L and forward/upward to address pusher tendencies as well as upright trunk with extension while B ischial tuberosities on wedge to facilitate anterior pelvic tilt.  Requires heavy max to total A to prevent LOB to the R, however does have some VERY delayed protective response, esp when LOB posteriorly.  Assisted back to w/c as above.  Left in w/c with quick release belt donned at nursing station for increased supervision.  RN aware.   Therapy Documentation Precautions:  Precautions Precautions: Fall Restrictions Weight Bearing Restrictions: No  Pain: Pain Assessment Pain Assessment: No/denies pain Pain Score: 0-No pain    See FIM for current functional status  Therapy/Group: Individual Therapy  Denice Bors 07/17/2014, 12:49 PM

## 2014-07-17 NOTE — Progress Notes (Signed)
Occupational Therapy Session Note  Patient Details  Name: Michael Goodwin MRN: [id number] Date of Birth: 09-14-1945  Today's Date: 07/17/2014 OT Individual Time: 1501-1530 OT Individual Time Calculation (min): 29 min    Skilled Therapeutic Interventions/Progress Updates:    Pt transitioned to the therapy gym via wheelchair and then to the therapy mat with max assist squat pivot.  Pt still with frequent eyes closing when working requiring max instructional cueing to keep them open.  In sitting on therapy mat had pt work on maintaining static sitting balance, using mirror for feedback.  Pt needed mod facilitation to maintain anterior pelvic tilt and cervical extension.  Pt was asked simple questions during task such as what color was the therapist's shirt.  He was unable to answer using the mirror for feedback, with therapist sitting on the pt's left side.  He was however able to correctly use the mirror to identify the color of his shirt.  Transitioned to standing with pt demonstrating pushing to the right side.  Unable to weightshift over the LLE in standing.  Max assist needed to maintain standing balance as well.    Therapy Documentation Precautions:  Precautions Precautions: Fall Restrictions Weight Bearing Restrictions: No  Pain: Pain Assessment Pain Assessment: No/denies pain ADL: See FIM for current functional status  Therapy/Group: Individual Therapy  Michael Goodwin OTR/L 07/17/2014, 4:21 PM

## 2014-07-17 NOTE — Progress Notes (Signed)
Occupational Therapy Session Note  Patient Details  Name: Marguarite ArbourClyde Hudson MRN: [id number] Date of Birth: 12/11/1945  Today's Date: 07/17/2014 OT Individual Time: 1004-1104 OT Individual Time Calculation (min): 60 min    Short Term Goals: Week 2:  OT Short Term Goal 1 (Week 2): Pt will perform bathing with max A at sink side or seated EOB.  OT Short Term Goal 2 (Week 2): Pt will perform toilet transfer with [facility] to decrease level of assist for functional transfers. OT Short Term Goal 3 (Week 2): Pt will engage in 5 minutes of static sitting balance with mod A in order to increase balance for functional tasks. OT Short Term Goal 4 (Week 2): Pt will perform UB dressing with mod A in order to increase I in self care.   Skilled Therapeutic Interventions/Progress Updates:    Pt difficulty to arouse this session as he was laying in bed with his eyes closed.  He needed max instructional cueing to initiate transfer from supine to sit EOB.  Once sitting increased forward trunk flexion and cervical flexion noted.  Max assist for transfer from bed to tilt in space wheelchair.  Worked on bathing and dressing sit to stand at the sink.  Max demonstrational cueing for pt to initiate and complete any bathing tasks.  Therapist had to initially place washcloth in his hand at a times provided hand over hand on the part that needed washing, and then he would take over.  Progressed to being able to squeeze the washcloth out at times but inconsistent.  Pt placed in tilted supported position as he continues to fall forward with continued trunk and cervical flexion if chair is placed in the upright position.  Therapist took tilt out of wheelchair as pt became more aroused.  He was able to wash his left upper leg and therapist provided hand over hand with the RUE to wash the left arm and right leg.  Increased flexor and internal rotation tone noted in the RUE.  Pt needed total assist for standing after  therapist assisted with threading pants.  Pt positioned at the nurses station with waist belt in place and chair tilted.    Therapy Documentation Precautions:  Precautions Precautions: Fall Restrictions Weight Bearing Restrictions: No  Pain: Pain Assessment Pain Assessment: No/denies pain Pain Score: 0-No pain ADL: See FIM for current functional status  Therapy/Group: Individual Therapy  Kelly Ranieri OTR/L 07/17/2014, 12:46 PM

## 2014-07-17 NOTE — Progress Notes (Addendum)
CC Pt denies Subjective/Complaints: Just finished with SLP  Review of Systems - limited due to mental status Pt denies itching, rashes, + knee pain, no breathing issues Neurological ZOX:WRUEA side weak, pt unaware of deficits Objective: Vital Signs: Blood pressure 175/82, pulse 66, temperature 98.3 F (36.8 C), temperature source Oral, resp. rate 18, height 6' (1.829 m), weight 68.4 kg (150 lb 12.7 oz), SpO2 95 %. No results found. No results found for this or any previous visit (from the past 72 hour(s)).   HEENT: normal Cardio: RRR and no murmur Resp: CTA B/L and unlabored GI: BS positive and NT, ND Extremity:  Pulses positive and No Edema Skin:   Intact Neuro: Lethargic,limitedMMT this am,2- Right knee ext, trace R biceps flexion, 4- in LUE and LLE   increased tone  and finger flexorsMusc/Skel:  Non knee joint welling or tenderness Other no pain with PROM of limbs Gen NAD   Assessment/Plan: 1. 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   Pt with persistent somnolence mainly due to [facility] location in thalamus affecting reticular activating system increased methylphenidate seems to be helping with level of alertness FIM: FIM - Bathing Bathing Steps Patient Completed: Front perineal area Bathing: 1: Total-Patient completes 0-2 of 10 parts or less than 25%  FIM - Upper Body Dressing/Undressing Upper body dressing/undressing steps patient completed: Thread/unthread left sleeve of pullover shirt/dress Upper body dressing/undressing: 2: Max-Patient completed 25-49% of tasks FIM - Lower Body Dressing/Undressing Lower body dressing/undressing steps patient completed:  (socks only no other public clothing) Lower body dressing/undressing: 1: Total-Patient completed less than 25%  of tasks  FIM - Toileting Toileting: 1: Two helpers  FIM - Diplomatic Services operational officer Devices: Psychiatrist Transfers: 6-More than reasonable amt of time  FIM - Banker Devices: HOB elevated, Bed rails Bed/Chair Transfer: 2: Supine > Sit: Max A (lifting assist/Pt. 25-49%), 2: Sit > Supine: Max A (lifting assist/Pt. 25-49%), 2: Bed > Chair or W/C: Max A (lift and lower assist)  FIM - Locomotion: Wheelchair Distance: 50 Locomotion: Wheelchair: 1: Total Assistance/staff pushes wheelchair (Pt<25%) FIM - Locomotion: Ambulation Locomotion: Ambulation Assistive Devices: Other (comment) (three muskateer style) Ambulation/Gait Assistance: 1: +2 Total assist Locomotion: Ambulation: 1: Two helpers  Comprehension Comprehension Mode: Auditory Comprehension: 2-Understands basic 25 - 49% of the time/requires cueing 51 - 75% of the time  Expression Expression Mode: Verbal Expression: 2-Expresses basic 25 - 49% of the time/requires cueing 50 - 75% of the time. Uses single words/gestures.  Social Interaction Social Interaction: 2-Interacts appropriately 25 - 49% of time - Needs frequent redirection.  Problem Solving Problem Solving: 1-Solves basic less than 25% of the time - needs direction nearly all the time or does not effectively solve problems and may need a restraint for safety  Memory Memory: 1-Recognizes or recalls less than 25% of the time/requires cueing greater than 75% of the time   Medical Problem List and Plan: 1. 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 DVT Prophylaxis/Anticoagulation: Subcutaneous heparin for DVT prophylaxis initiated 07/06/2014 3. Pain Management: Tylenol as needed 4. Dysphagia. Dysphagia 3 thin liquids. Monitor for any signs of aspiration. Follow-up speech therapy.IVFs for hydration due to decreased nutritional storage and  hydration 5. Neuropsych: This patient is capable of making decisions on his  own behalf. 6. Skin/Wound Care: Routine skin checks 7. Fluids/Electrolytes/Nutrition: Routine I/Os with follow-up chemistries 8. Hypertension. Lisinopril 20 mg daily.Uncontrolled on amlodipine as well may need to increase to 10mg  9. Tobacco abuse, counseling  LOS (Days) 10 A FACE TO FACE EVALUATION WAS PERFORMED  Marketia Stallsmith E 07/17/2014, 9:01 AM

## 2014-07-18 ENCOUNTER — Inpatient Hospital Stay (HOSPITAL_COMMUNITY): Payer: Medicare Other | Admitting: Rehabilitation

## 2014-07-18 ENCOUNTER — Inpatient Hospital Stay (HOSPITAL_COMMUNITY): Payer: Medicare Other | Admitting: Occupational Therapy

## 2014-07-18 ENCOUNTER — Inpatient Hospital Stay (HOSPITAL_COMMUNITY): Payer: Medicare Other | Admitting: Speech Pathology

## 2014-07-18 NOTE — Progress Notes (Signed)
CC cannot verbalize Subjective/Complaints: Somnolent this am Awakens to physical stim but not to voice  Review of Systems - limited due to mental status  Neurological ZOX:WRUEA side weak, pt unaware of deficits Objective: Vital Signs: Blood pressure 153/92, pulse 89, temperature 97.8 F (36.6 C), temperature source Oral, resp. rate 17, height 6' (1.829 m), weight 68.4 kg (150 lb 12.7 oz), SpO2 94 %. No results found. No results found for this or any previous visit (from the past 72 hour(s)).   HEENT: normal Cardio: RRR and no murmur Resp: CTA B/L and unlabored GI: BS positive and NT, ND Extremity:  Pulses positive and No Edema Skin:   Intact Neuro: Lethargic,limitedMMT this am,2- Right knee ext, trace R biceps flexion, 4- in LUE and LLE   increased tone  and finger flexorsMusc/Skel:  Non knee joint welling or tenderness Other no pain with PROM of limbs Gen NAD   Assessment/Plan: 1. 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   Pt with persistent somnolence mainly due to [facility] location in thalamus affecting reticular activating system Question about sleep at night  will ask for sleep /wake chart to clarify FIM: FIM - Bathing Bathing Steps Patient Completed: Chest, Left Arm, Abdomen, Left lower leg (including foot), Left upper leg Bathing: 3: Mod-Patient completes 5-7 67f 10 parts or 50-74%  FIM - Upper Body Dressing/Undressing Upper body dressing/undressing steps patient completed: Thread/unthread left sleeve of pullover shirt/dress Upper body dressing/undressing: 2: Max-Patient completed 25-49% of tasks FIM - Lower Body Dressing/Undressing Lower body dressing/undressing steps patient completed:  (socks only no other public clothing) Lower body dressing/undressing: 1:  Total-Patient completed less than 25% of tasks  FIM - Toileting Toileting: 1: Two helpers  FIM - Diplomatic Services operational officer Devices: Psychiatrist Transfers: 6-More than reasonable amt of time  FIM - Banker Devices: HOB elevated, Bed rails Bed/Chair Transfer: 0: Activity did not occur  FIM - Locomotion: Wheelchair Distance: 55 Locomotion: Wheelchair: 1: Total Assistance/staff pushes wheelchair (Pt<25%) FIM - Locomotion: Ambulation Locomotion: Ambulation Assistive Devices: Other (comment) (three muskateer style) Ambulation/Gait Assistance: 1: +2 Total assist Locomotion: Ambulation: 0: Activity did not occur  Comprehension Comprehension Mode: Auditory Comprehension: 2-Understands basic 25 - 49% of the time/requires cueing 51 - 75% of the time  Expression Expression Mode: Verbal Expression: 2-Expresses basic 25 - 49% of the time/requires cueing 50 - 75% of the time. Uses single words/gestures.  Social Interaction Social Interaction: 2-Interacts appropriately 25 - 49% of time - Needs frequent redirection.  Problem Solving Problem Solving: 2-Solves basic 25 - 49% of the time - needs direction more than half the time to initiate, plan or complete simple activities  Memory Memory: 1-Recognizes or recalls less than 25% of the time/requires cueing greater than 75% of the time   Medical Problem List and Plan: 1. 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 DVT Prophylaxis/Anticoagulation: Subcutaneous heparin for DVT prophylaxis initiated 07/06/2014 3. Pain Management: Tylenol as needed 4. Dysphagia. Dysphagia 3 thin liquids. Monitor for any signs of aspiration. Follow-up speech therapy.IVFs for hydration due to decreased nutritional storage and hydration 5. Neuropsych: This patient is capable of making decisions on his own behalf. 6. Skin/Wound Care:  Routine skin checks 7. Fluids/Electrolytes/Nutrition: Routine I/Os with follow-up chemistries 8. Hypertension. Lisinopril 20  mg daily.Uncontrolled on amlodipine as well may need to increase to 10mg  9. Tobacco abuse, counseling  LOS (Days) 11 A FACE TO FACE EVALUATION WAS PERFORMED  Michael Goodwin 07/18/2014, 8:37 AM

## 2014-07-18 NOTE — Progress Notes (Signed)
Occupational Therapy Session Note  Patient Details  Name: Michael ArbourClyde Musson MRN: [id number] Date of Birth: December 03, 1945  Today's Date: 07/18/2014 OT Individual Time: 0900-1000 OT Individual Time Calculation (min): 60 min    Short Term Goals: Week 2:  OT Short Term Goal 1 (Week 2): Pt will perform bathing with max A at sink side or seated EOB.  OT Short Term Goal 2 (Week 2): Pt will perform toilet transfer with max [facility] in order to decrease level of assist for functional transfers. OT Short Term Goal 3 (Week 2): Pt will engage in 5 minutes of static sitting balance with mod A in order to increase balance for functional tasks. OT Short Term Goal 4 (Week 2): Pt will perform UB dressing with mod A in order to increase I in self care.   Skilled Therapeutic Interventions/Progress Updates:    Pt transitioned to sitting EOB with max assist in order to work on bathing task.  Increased cervical and thoracic flexion noted during session.  Pt needing max instructional cueing to maintain sustained attention to task as well as max demonstrational cueing to sequence through task.  Hand over hand 50% of the time to initiate bathing various body parts, then he would take over.  Pt attempting to spontaneously use the RUE for bathing and he was able to utilize it but needs max hand over hand secondary to increased flexor tone in the digits and elbow as well as increased tone in internal rotation.  Pt with moderate difficulty visually locating his LUE as well when therapist would help him place the washcloth in his hand.  Decreased left/right discrimination noted.  Total assist +2 for sit to stand when washing peri area and for donning new brief.  Increased pushing noted to the right side in standing.  Total assist for stand pivot to the right side to wheelchair.  Pt left with SLP for feeding at end of session.  LUE place in arm trough on wheelchair for positioning.   Therapy Documentation Precautions:   Precautions Precautions: Fall Restrictions Weight Bearing Restrictions: No  Pain: Pain Assessment Pain Assessment: No/denies pain ADL: See FIM for current functional status  Therapy/Group: Individual Therapy  Jimmye Wisnieski OTR/L 07/18/2014, 12:27 PM

## 2014-07-18 NOTE — Progress Notes (Signed)
Speech Language Pathology Daily Session Note  Patient Details  Name: Marguarite ArbourClyde Stege MRN: [id number] Date of Birth: 1945-11-11  Today's Date: 07/18/2014 SLP Individual Time: 1000-1056 SLP Individual Time Calculation (min): 56 min  Short Term Goals: Week 2: SLP Short Term Goal 1 (Week 2): Pt will tolerate dysphagia 1 with nectar thick liquids using swallow strategies with max A  verbal/visual/tactile cueing. SLP Short Term Goal 2 (Week 2): Pt will focus attention to functional tasks for 30 seconds with max A  multimodal cueing. SLP Short Term Goal 3 (Week 2): Pt will be oriented x4 with visual external aids with max A multimodal cueing. SLP Short Term Goal 4 (Week 2): Pt will recall biographical information with max A multimodal cueing. SLP Short Term Goal 5 (Week 2): Pt will demonstrate basic problem solving in functional tasks with max A  multimodal cueing.  Skilled Therapeutic Interventions:  Pt was seen for skilled ST targeting goals for dysphagia and cognition.  Pt was received from OT therapy session, upright in wheelchair, awake, lethargic, but agreeable to participate in ST.  Pt did not recognize SLP from previous therapy sessions.  Pt required max to total assist to reorient to place and situation.  Pt required anywhere from max to hand over hand total assist to initiate and complete self feeding tasks during presentations of his currently prescribed diet.  Pt consumed dys 1 textures with nectar thick liquids with max to total assist for portion control and correction of anterior loss of materials resulting from decreased labial seal.  Pt with soft, delayed cough throughout meal that did not appear to be related to PO intake as he presented with clear vocal quality immediately following the swallow.  Suspect pt's cough to be related to decreased management of secretions in the setting of increased lethargy.  Therefore, SLP provided hand over hand assist to facilitate completion of oral care after  meal.  Pt was left up at nursing station,quick release belt donned.  Continue per current plan of care.    FIM:  Comprehension Comprehension Mode: Auditory Comprehension: 2-Understands basic 25 - 49% of the time/requires cueing 51 - 75% of the time Expression Expression Mode: Verbal Expression: 2-Expresses basic 25 - 49% of the time/requires cueing 50 - 75% of the time. Uses single words/gestures. Social Interaction Social Interaction: 2-Interacts appropriately 25 - 49% of time - Needs frequent redirection. Problem Solving Problem Solving: 1-Solves basic less than 25% of the time - needs direction nearly all the time or does not effectively solve problems and may need a restraint for safety Memory Memory: 1-Recognizes or recalls less than 25% of the time/requires cueing greater than 75% of the time FIM - Eating Eating Activity: 4: Helper checks for pocketed food;4: Help with picking up utensils;4: Helper occasionally scoops food on utensil;4: Help with managing cup/glass  Pain Pain Assessment Pain Assessment: No/denies pain  Therapy/Group: Individual Therapy  Jacora Hopkins, Melanee SpryNicole L 07/18/2014, 4:04 PM

## 2014-07-18 NOTE — Progress Notes (Signed)
Physical Therapy Session Note  Patient Details  Name: Michael Goodwin MRN: [id number] Date of Birth: 1945-09-10  Today's Date: 07/18/2014 PT Individual Time: 1300-1400 PT Individual Time Calculation (min): 60 min   Short Term Goals: Week 2:  PT Short Term Goal 1 (Week 2): Pt will perform bed mobility with mod A with 75% cues to attend to RUE/LE PT Short Term Goal 2 (Week 2): Pt will perform functional transfers R and L at mod A level with 75% cues for technique PT Short Term Goal 3 (Week 2): Pt will perform sit<>stand with mod A with 75% cues to attend to L environment PT Short Term Goal 4 (Week 2): Pt will perform dynamic standing tasks at max A level for 3 mins to increase independence with ADLs.  PT Short Term Goal 5 (Week 2): Pt will ambulate x 10' with LRAD and +2A for safety with pt assist approx 30%.   Skilled Therapeutic Interventions/Progress Updates:   pt received sitting in w/c in room, RN providing shot to stomach.  Assisted with donning shoes prior to leaving room.  Skilled session focused on standing with use of standing frame to address midline orientation, increased weight shift to the L to decrease pusher tendencies with use of small block under RLE, sustained attention and alertness.  Performed standing x 7 mins x 1 rep and another 10 mins x 1 rep while playing game of War.  Requires increased facilitation for posture, increased R rotation and max HOH A to complete task.  Pt did engage in naming number on card and was able to state which was one larger, however was unable to sequence game together for full round, including recalling name of game.   Continues to close eyes throughout session, however did seem to engage more with rehab tech to play card game.  Pt progressed to being able to state who won therefore had pt reach upwards to facilitate increased hip extension and trunk extension during task.  Ended session with seated task reaching forward for target to facilitate increased  forward weight shift and hip flex to carry over to transfers.  Requires HOH assist due to heavy reliance of LUE on L leg when reaching.  Assisted pt back to nursing station with quick release belt donned and RN aware.   Therapy Documentation Precautions:  Precautions Precautions: Fall Restrictions Weight Bearing Restrictions: No  Pain: Pain Assessment Pain Assessment: No/denies pain  See FIM for current functional status  Therapy/Group: Individual Therapy  Vista Deckarcell, Alfred Harrel Ann 07/18/2014, 12:36 PM

## 2014-07-18 NOTE — Progress Notes (Signed)
Occupational Therapy Session Note  Patient Details  Name: Michael Goodwin MRN: [id number] Date of Birth: Mar 28, 1945  Today's Date: 07/18/2014 OT Individual Time: 1502-1530 OT Individual Time Calculation (min): 28 min    Skilled Therapeutic Interventions/Progress Updates:    Pt with decreased sustained attention maintaining forward trunk and cervical flexion while sitting at the sink.  Worked on sit to stand and standing in order to change soiled brief and clean up pt from BM.  He needed max instructional cueing to maintain his eyes open with mod to max facilitation from therapist to maintain cervical extension to neutral.  Total assist +2 (pt 25%) for sit to stand and standing during task.  Pt with increased pushing to the right side with decreased ability to maintain active right knee or hip extension.  Max facilitation from therapist for stability at these joints.  Once finished pt transferred back to bed with total assist stand pivot.    Therapy Documentation Precautions:  Precautions Precautions: Fall Precaution Comments: decreased attention, pusher to the right side, decreased trunk control and posture Restrictions Weight Bearing Restrictions: No  Pain: Pain Assessment Pain Assessment: No/denies pain ADL: See FIM for current functional status  Therapy/Group: Individual Therapy  Giannah Zavadil OTR/L 07/18/2014, 3:49 PM

## 2014-07-19 ENCOUNTER — Inpatient Hospital Stay (HOSPITAL_COMMUNITY): Payer: Medicare Other | Admitting: Rehabilitation

## 2014-07-19 ENCOUNTER — Inpatient Hospital Stay (HOSPITAL_COMMUNITY): Payer: Medicare Other | Admitting: Occupational Therapy

## 2014-07-19 ENCOUNTER — Inpatient Hospital Stay (HOSPITAL_COMMUNITY): Payer: Medicare Other | Admitting: Speech Pathology

## 2014-07-19 DIAGNOSIS — G8191 Hemiplegia, unspecified affecting right dominant side: Secondary | ICD-10-CM

## 2014-07-19 DIAGNOSIS — I69898 Other sequelae of other cerebrovascular disease: Secondary | ICD-10-CM

## 2014-07-19 DIAGNOSIS — I6981 Cognitive deficits following other cerebrovascular disease: Secondary | ICD-10-CM

## 2014-07-19 DIAGNOSIS — R208 Other disturbances of skin sensation: Secondary | ICD-10-CM

## 2014-07-19 DIAGNOSIS — I619 Nontraumatic intracerebral hemorrhage, unspecified: Secondary | ICD-10-CM

## 2014-07-19 LAB — BASIC METABOLIC PANEL
Anion gap: 7 (ref 5–15)
BUN: 12 mg/dL (ref 6–20)
CALCIUM: 9.5 mg/dL (ref 8.9–10.3)
CHLORIDE: 102 mmol/L (ref 101–111)
CO2: 27 mmol/L (ref 22–32)
Creatinine, Ser: 1.05 mg/dL (ref 0.61–1.24)
GFR calc Af Amer: >60 mL/min (ref >60)
GFR calc non Af Amer: >60 mL/min (ref >60)
Glucose, Bld: 85 mg/dL (ref 65–99)
Potassium: 4.1 mmol/L (ref 3.5–5.1)
Sodium: 136 mmol/L (ref 135–145)

## 2014-07-19 MED ORDER — METHYLPHENIDATE HCL 5 MG PO TABS
10.0000 mg | ORAL_TABLET | Freq: Two times a day (BID) | ORAL | Status: DC
  Administered 2014-07-19 – 2014-08-01 (×27): 10 mg via ORAL
  Filled 2014-07-19 (×27): qty 2

## 2014-07-19 NOTE — Patient Care Conference (Signed)
Inpatient RehabilitationTeam Conference and Plan of Care Update Date: 07/19/2014   Time: 11;25 AM    Patient Name: Michael Goodwin      Medical Record Number: [id number]  Date of Birth: May 13, 1945 Sex: Male         Room/Bed: 4W15C/4W15C-01 Payor Info: Payor: MEDICARE / Plan: MEDICARE PART A AND B / Product Type: *No Product type* /    Admitting Diagnosis: CVA  Admit Date/Time:  07/07/2014  5:08 PM Admission Comments: No comment available   Primary Diagnosis:  Nontraumatic thalamic hemorrhage Principal Problem: Nontraumatic thalamic hemorrhage  Patient Active Problem List   Diagnosis Date Noted  . Alterations of sensations following CVA (cerebrovascular accident) 07/10/2014  . Acute respiratory failure with hypoxia 07/07/2014  . Abnormal urinalysis 07/07/2014  . Hypokalemia 07/07/2014  . Dehydration 07/07/2014  . Metabolic encephalopathy 07/07/2014  . Aortic root dilatation (48 mm May 2016) 07/07/2014  . 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 ICH (intracerebral hemorrhage) 07/07/2014  . 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 Hemiplegia affecting right dominant side 07/06/2014  . Cognitive deficit due to recent stroke 07/06/2014  . IVH (intraventricular hemorrhage) 07/06/2014  . Essential hypertension 07/06/2014  . Intracranial hemorrhage     Expected Discharge Date: Expected Discharge Date: 07/28/14  Team Members Present: Physician leading conference: Dr. Claudette LawsAndrew Kirsteins Social Worker Present: Dossie DerBecky Renleigh Ouellet, LCSW Nurse Present: Carmie EndAngie Joyce, RN PT Present: Edman CircleAudra Hall, PT;Emily Parcell, PT OT Present: Callie FieldingKatie Pittman, OT SLP Present: Jackalyn LombardNicole Page, SLP PPS Coordinator present : Tora DuckMarie Noel, RN, CRRN     Current Status/Progress Goal Weekly Team Focus  Medical   some increased attention, sleep is poor     increase remeron, and ritalin   Bowel/Bladder   Incont of bowel and bladder; LBM 6/5  level 5 assist for bladder and level 4 for bowel  timed toilet  [facility]   Swallow/Nutrition/ Hydration   Dys 1, nectar thick liquids; full supervision for use of swallowing precautions  min assist, downgraded due to persistent lethargy  toleration of currnet diet, trials of advanced consistencies pending alertness   ADL's   Max assist for bathing, total assist for dressing, max to total assist for transfers.  Decreased sustained attention, closing eyes frequently during session,  Brunnstum stage II - III movement in the LUE with increased tone in the fingers, elbow, and shoulder.  supervision to min assist level  neuromuscular re-education, selfcare retraining, cognitive re-trainiing, pt/family education, LUE exercise   Mobility   Pt currently total A for bed mobility, max to +2A for transfers pending level of arousal, max A for standing (+2 for more dynamic tasks) and +2A for gait. Pt has been severely limited by fatigue and lethargy during sessions  S to min A overall at w/c level  R NMR, alertness, attention, transfers, sitting balance, pt/family education   Communication   severe dysarthria, improved language of confusion   min assist, downgraded   slow rate, increased vocal intensity    Safety/Cognition/ Behavioral Observations  Max to total   min assist   orientation, right inattention, focused attention to tasks, alertness    Pain   n/a  n/a  n/a   Skin   skin tear rt elbow with foam dressing  no new breakdown while hospitalized  reinforce skin inspection and attention to rt side to avoid injury    Rehab Goals Patient on target to meet rehab goals: No Rehab Goals Revised: goals may need to be downgraded *See  Care Plan and progress notes for long and short-term goals.  Barriers to Discharge: poor motor recovery    Possible Resolutions to Barriers:  see above , family training    Discharge Planning/Teaching Needs:  Pt to go to his home at d/c with dtr, son, and family friend taking turns being with pt.  Therapy team is  concerned pt's care needs may be greater than what family can provide at home.  The above caregivers to come in for family education closer to pt's d/c.  Some family education has already begun.   Team Discussion:  Slow progress in therapies-increasing ritalin and sleep medicine. Still awake at night, sleeping during the day. Limited by fatigue. Dys 1 nectar thick liquids. Tone in fingers, elbow and shoulder. R-inattention. Incont B & B  Revisions to Treatment Plan:  Question if family can provide the care pt needs   Continued Need for Acute Rehabilitation Level of Care: The patient requires daily medical management by a physician with specialized training in physical medicine and rehabilitation for the following conditions: Daily direction of a multidisciplinary physical rehabilitation program to ensure safe treatment while eliciting the highest outcome that is of practical value to the patient.: Yes Daily medical management of patient stability for increased activity during participation in an intensive rehabilitation regime.: Yes Daily analysis of laboratory values and/or radiology reports with any subsequent need for medication adjustment of medical intervention for : Neurological problems  Kasheem Toner, Lemar Livings 07/19/2014, 2:07 PM

## 2014-07-19 NOTE — Progress Notes (Signed)
Speech Language Pathology Daily Session Note  Patient Details  Name: Michael ArbourClyde Huq MRN: [id number] Date of Birth: Apr 27, 1945  Today's Date: 07/19/2014 SLP Individual Time:  -     Short Term Goals: Week 2: SLP Short Term Goal 1 (Week 2): Pt will tolerate dysphagia 1 with nectar thick liquids using swallow strategies with max A  verbal/visual/tactile cueing. SLP Short Term Goal 1 - Progress (Week 2): Progressing toward goal SLP Short Term Goal 2 (Week 2): Pt will focus attention to functional tasks for 30 seconds with max A  multimodal cueing. SLP Short Term Goal 2 - Progress (Week 2): Progressing toward goal SLP Short Term Goal 3 (Week 2): Pt will be oriented x4 with visual external aids with max A multimodal cueing. SLP Short Term Goal 3 - Progress (Week 2): Progressing toward goal SLP Short Term Goal 4 (Week 2): Pt will recall biographical information with max A multimodal cueing. SLP Short Term Goal 4 - Progress (Week 2): Progressing toward goal SLP Short Term Goal 5 (Week 2): Pt will demonstrate basic problem solving in functional tasks with max A  multimodal cueing. SLP Short Term Goal 5 - Progress (Week 2): Progressing toward goal  Skilled Therapeutic Interventions: Skilled treatment session focused on dysphagia goals. Pt was seated in wheel chair at RN station, and was taken to his room by SLP. Pt tolerated small bites of ice cream, with minA verbal or tactile cues to redirect attention to task. No overt s/s aspiration observed during trials. Pt oriented to person and place, not time or situation. Pt speech is difficult to understand at sentence level, however, intelligibility improves significantly when pt responds to ModA cues to decrease rate and speak one word at a time.  Pt was returned to the RN station for safety, quick release belt donned, due to impulsive behavior and poor awareness of deficits.  Continue with current plan of care.   FIM:     Pain Pain Assessment Pain  Assessment: No/denies pain Pain Score: 0-No pain  Therapy/Group: Individual Therapy   Lakeya Mulka B. AguadaBueche, [facility] Rehabilitation HospitalMSP, CCC-SLP [phone]  Leigh AuroraBueche, Aribelle Mccosh Brown 07/19/2014, 3:26 PM

## 2014-07-19 NOTE — Progress Notes (Signed)
CC Tired Subjective/Complaints: Poor sleep at night  Review of Systems - limited due to mental status  Neurological ZYS:AYTKZ side weak, pt unaware of deficits Objective: Vital Signs: Blood pressure 164/64, pulse 60, temperature 97.6 F (36.4 C), temperature source Oral, resp. rate 18, height 6' (1.829 m), weight 68.4 kg (150 lb 12.7 oz), SpO2 98 %. No results found. No results found for this or any previous visit (from the past 72 hour(s)).   HEENT: normal Cardio: RRR and no murmur Resp: CTA B/L and unlabored GI: BS positive and NT, ND Extremity:  Pulses positive and No Edema Skin:   Intact Neuro: Lethargic,limitedMMT this am,2- Right knee ext, trace R biceps flexion, 4- in LUE and LLE   increased tone  and finger flexorsMusc/Skel:  Non knee joint welling or tenderness Other no pain with PROM of limbs Gen NAD   Assessment/Plan: 1. 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  Team conference today please see physician documentation under team conference tab, met with team face-to-face to discuss problems,progress, and goals. Formulized individual treatment plan based on medical history, underlying problem and comorbidities. Pt with persistent somnolence mainly due to [facility] location in thalamus affecting reticular activating system Question about sleep at night  will ask for sleep /wake chart to clarify FIM: FIM - Bathing Bathing Steps Patient Completed: Chest, Left Arm, Abdomen, Front perineal area Bathing: 2: Max-Patient completes 3-4 [id number] parts or 25-49%  FIM - Upper Body Dressing/Undressing Upper body dressing/undressing steps patient completed: Thread/unthread left sleeve of pullover shirt/dress Upper body dressing/undressing: 2: Max-Patient completed 25-49% of  tasks FIM - Lower Body Dressing/Undressing Lower body dressing/undressing steps patient completed:  (socks only no other public clothing) Lower body dressing/undressing: 1: Two helpers  FIM - Toileting Toileting: 1: Two helpers  FIM - TRadio producerDevices: BRecruitment consultantTransfers: 6-More than reasonable amt of time  FIM - BControl and instrumentation engineerDevices: HOB elevated, Bed rails Bed/Chair Transfer: 1: Two helpers, 2: Supine > Sit: Max A (lifting assist/Pt. 25-49%)  FIM - Locomotion: Wheelchair Distance: 55 Locomotion: Wheelchair: 1: Total Assistance/staff pushes wheelchair (Pt<25%) FIM - Locomotion: Ambulation Locomotion: Ambulation Assistive Devices: Other (comment) (three muskateer style) Ambulation/Gait Assistance: 1: +2 Total assist Locomotion: Ambulation: 0: Activity did not occur  Comprehension Comprehension Mode: Auditory Comprehension: 2-Understands basic 25 - 49% of the time/requires cueing 51 - 75% of the time  Expression Expression Mode: Verbal Expression: 2-Expresses basic 25 - 49% of the time/requires cueing 50 - 75% of the time. Uses single words/gestures.  Social Interaction Social Interaction: 2-Interacts appropriately 25 - 49% of time - Needs frequent redirection.  Problem Solving Problem Solving: 2-Solves basic 25 - 49% of the time - needs direction more than half the time to initiate, plan or complete simple activities  Memory Memory: 1-Recognizes or recalls less than 25% of the time/requires cueing greater than 75% of the time   Medical Problem List and Plan: 1. 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 DVT Prophylaxis/Anticoagulation: Subcutaneous heparin for DVT prophylaxis initiated 07/06/2014 3. Pain Management: Tylenol as needed 4. Dysphagia. Dysphagia 3 thin liquids. Monitor for any signs of aspiration. Follow-up  speech therapy.IVFs for hydration due to decreased nutritional storage and hydration 5. Neuropsych: This patient is capable of making decisions on  his own behalf. 6. Skin/Wound Care: Routine skin checks 7. Fluids/Electrolytes/Nutrition: Routine I/Os with follow-up chemistries 8. Hypertension. Lisinopril 20 mg daily.Uncontrolled on amlodipine as well may need to increase to 53m 9. Tobacco abuse, counseling  LOS (Days) 12 A FACE TO FACE EVALUATION WAS PERFORMED  KIRSTEINS,ANDREW E 07/19/2014, 9:41 AM

## 2014-07-19 NOTE — Progress Notes (Signed)
Social Work Patient ID: Michael Goodwin, male   DOB: 1945-10-24, 69 y.o.   MRN: [id number] Met with pt and his family member to discuss team conference progress toward his goals and also left message for his daughter-katie to give same information. Will need to begin family education next week in anticipation of discharge next Friday. Will await return call to discuss if family can provide 24 hr physical care.

## 2014-07-19 NOTE — Progress Notes (Signed)
Physical Therapy Session Note  Patient Details  Name: [name] MRN: [id number] Date of Birth: 06-27-1945  Today's Date: 07/19/2014 PT Individual Time: 1300-1358 PT Individual Time Calculation (min): 58 min   Short Term Goals: Week 2:  PT Short Term Goal 1 (Week 2): Pt will perform bed mobility with mod A with 75% cues to attend to RUE/LE PT Short Term Goal 2 (Week 2): Pt will perform functional transfers R and L at mod A level with 75% cues for technique PT Short Term Goal 3 (Week 2): Pt will perform sit<>stand with mod A with 75% cues to attend to L environment PT Short Term Goal 4 (Week 2): Pt will perform dynamic standing tasks at max A level for 3 mins to increase independence with ADLs.  PT Short Term Goal 5 (Week 2): Pt will ambulate x 10' with LRAD and +2A for safety with pt assist approx 30%.   Skilled Therapeutic Interventions/Progress Updates:   Pt received sitting in w/c in room, RN getting to ready to assist pt with voiding.  Pt able to stand with max A while rehab tech assisted with brief.  Upon assisting with brief, note that pt already incontinent of bladder, therefore assisted with removal of brief, peri care and donning new brief and pants while standing in front of mirror for increased visual feedback.  Note pts standing did improve, however the longer he stood the more fatigued he became with max verbal cues to attend to mirror for midline orientation.  Also noted that pushing tends to increase with increased standing.  Assisted pt back to w/c and to therapy gym.  Skilled session focused on functional transfers w/c<>mat and dynamic sitting balance without LE support to decrease pushing, address pusher tendencies, as well as increased attention to the R.  Note marked improvement in alertness during session today and engaged much more with therapist.  Performed squat pivot transfer w/c>mat to the R with pt holding onto arm rest of chair that PT was sitting in for increased forward  weight shift at mod A level.  Transferred back to w/c at end of session at mod A level with good initiation of reaching for arm rest and forward weight shift.  While on mat, performing reaching with mat elevated to address deficits listed above.  Tolerated well with min to mod A for occasional LOB to the R and posteriorly however continues to demonstrate improved protective responses in sitting.  Assisted back to w/c and to nursing station with quick release belt donned.  RN aware.   Therapy Documentation Precautions:  Precautions Precautions: Fall Precaution Comments: decreased attention, pusher to the right side, decreased trunk control and posture Restrictions Weight Bearing Restrictions: No  Pain: Pt with no s/s of pain during session.   See FIM for current functional status  Therapy/Group: Individual Therapy  Vista Deckarcell, Kimyah Frein Ann 07/19/2014, 12:20 PM

## 2014-07-19 NOTE — Progress Notes (Signed)
Occupational Therapy Session Note  Patient Details  Name: Michael Goodwin MRN: [id number] Date of Birth: [birth year]  Today's Date: 07/19/2014 OT Individual Time: 2951-8841 and 1500- 1557 OT Individual Time Calculation (min): 58 min and 57 min    Short Term Goals: Week 2:  OT Short Term Goal 1 (Week 2): Pt will perform bathing with max A at sink side or seated EOB.  OT Short Term Goal 2 (Week 2): Pt will perform toilet transfer with max A of 1 to [facility] in order to decrease level of assist for functional transfers. OT Short Term Goal 3 (Week 2): Pt will engage in 5 minutes of static sitting balance with mod A in order to increase balance for functional tasks. OT Short Term Goal 4 (Week 2): Pt will perform UB dressing with mod A in order to increase I in self care.   Skilled Therapeutic Interventions/Progress Updates:  Session 1: Upon entering the room, pt supine in bed with no c/o pain this session. Pt lethargic during beginning of session and requiring multiple verbal and tactile cues to open eyes. LB bathing completed while in supine. Pt initiated roll to R with min A to protect hemiplegic side. Pt requiring mod A to roll to L during LB clothing management. Pt able to wash 4/10 parts during session including peri area. Supine >sit with max A to EOB. Pt requiring +2 assist from stand pivot transfer bed >wheelchair secondary to increased pushing. Once seated in tilt in space. Pt threaded pull over shirt onto L UE and required further assist with following steps. Pt requesting to eat breakfast once dressed but declined food on tray. Pt seated in wheelchair and able to bring thickened liquid in cup to mouth with L UE when cup is placed into hand. Pt required min verbal cues for small sips of liquid. Pt ate 25% of magic cup this session with therapist scooping food onto spoon and placing into hand. Once in hand, pt able to bring to mouth. Quick release belt and R UE arm tray placed on wheelchair. OT pushed pt  to RN station where he remained for supervision for safety.  Session 2: Pt received at RN station and with no c/o,signs, or symptoms during session. OT propelled wheelchair to room via total A. Pt performed stand pivot transfer to drop arm commode chair with +2 assist for safety. Pt pushing with L LE towards the right during transfer. Pt reported he did not need to use bathroom at this time. OT assisted pt in leaning to the R total A in order to push pants down and pull up on L side. Pt returned to tilt in space in same manner. Pt required mod verbal cues to look to R in order to identify wheelchair prior to transfer. Pt seated in wheelchair at sink side and able to initiate oral care with min verbal cues to brush teeth with L hand. Pt able to state birthday and location. Pt not oriented to situation or time without max verbal cues and choice of 2 offered to pt. He engaged in seated table task for upright posture, initiation, awareness, and problem solving. Pt engaged in [employer] with therapist. He did not need verbal cues for turn taking and even stating, "It's my turn" after OT placing card down. Pt able to verbalize color and number of cards when asked with 100% accuracy. However, he was unable to look at cards and problem solve strategy of what card to play without assistance. Pt  would slide cards on table with L UE with increased time. Pt returned to room for stand pivot wheelchair >bed transfer of total A ( pt 20%). Sit >supine with total A as well. Pt supine in bed with call bell and all needed items within reach.  Bed alarm activated as RN entering to give medications.   Therapy Documentation Precautions:  Precautions Precautions: Fall Precaution Comments: decreased attention, pusher to the right side, decreased trunk control and posture Restrictions Weight Bearing Restrictions: No Vital Signs: Therapy Vitals Temp: 97.6 F (36.4 C) Temp Source: Oral Pulse Rate: 60 Resp: 18 BP: (!)  164/64 mmHg Patient Position (if appropriate): Lying Oxygen Therapy SpO2: 98 % O2 Device: Not Delivered  See FIM for current functional status  Therapy/Group: Individual Therapy  Lowella Gripittman, Haidy Kackley L 07/19/2014, 9:58 AM

## 2014-07-19 NOTE — Progress Notes (Signed)
Social Work Lucy Chrisebecca G Ulas Zuercher, LCSW Social Worker Signed  Patient Care Conference 07/19/2014  2:07 PM    Expand All Collapse All   Inpatient RehabilitationTeam Conference and Plan of Care Update Date: 07/19/2014   Time: 11;25 AM     Patient Name: Michael Goodwin       Medical Record Number: [id number]  Date of Birth: 06-02-1945 Sex: Male         Room/Bed: 4W15C/4W15C-01 Payor Info: Payor: MEDICARE / Plan: MEDICARE PART A AND B / Product Type: *No Product type* /    Admitting Diagnosis: CVA  Admit Date/Time:  07/07/2014  5:08 PM Admission Comments: No comment available   Primary Diagnosis:  Nontraumatic thalamic hemorrhage Principal Problem: Nontraumatic thalamic hemorrhage    Patient Active Problem List     Diagnosis  Date Noted   .  Alterations of sensations following CVA (cerebrovascular accident)  07/10/2014   .  Acute respiratory failure with hypoxia  07/07/2014   .  Abnormal urinalysis  07/07/2014   .  Hypokalemia  07/07/2014   .  Dehydration  07/07/2014   .  Metabolic encephalopathy  07/07/2014   .  Aortic root dilatation (48 mm May 2016)  07/07/2014   .  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  ICH (intracerebral hemorrhage)  07/07/2014   .  Nontraumatic thalamic hemorrhage  07/06/2014   .  Hemiplegia affecting right dominant side  07/06/2014   .  Cognitive deficit due to recent stroke  07/06/2014   .  IVH (intraventricular hemorrhage)  07/06/2014   .  Essential hypertension  07/06/2014   .  Intracranial hemorrhage       Expected Discharge Date: Expected Discharge Date: 07/28/14  Team Members Present: Physician leading conference: Dr. Claudette LawsAndrew Kirsteins Social Worker Present: Dossie DerBecky Ha Shannahan, LCSW Nurse Present: Carmie EndAngie Joyce, RN PT Present: Edman CircleAudra Hall, PT;Emily Parcell, PT OT Present: Callie FieldingKatie Pittman, OT SLP Present: Jackalyn LombardNicole Page, SLP PPS Coordinator present : Tora DuckMarie Noel, RN, CRRN        Current Status/Progress  Goal  Weekly Team Focus   Medical     some  increased attention, sleep is poor      increase remeron, and ritalin   Bowel/Bladder     Incont of bowel and bladder; LBM 6/5   level 5 assist for bladder and level 4 for bowel   timed toilet [facility]    Swallow/Nutrition/ Hydration     Dys 1, nectar thick liquids; full supervision for use of swallowing precautions  min assist, downgraded due to persistent lethargy   toleration of currnet diet, trials of advanced consistencies pending alertness   ADL's     Max assist for bathing, total assist for dressing, max to total assist for transfers.  Decreased sustained attention, closing eyes frequently during session,  Brunnstum stage II - III movement in the LUE with increased tone in the fingers, elbow, and shoulder.  supervision to min assist level  neuromuscular re-education, selfcare retraining, cognitive re-trainiing, pt/family education, LUE exercise   Mobility     Pt currently total A for bed mobility, max to +2A for transfers pending level of arousal, max A for standing (+2 for more dynamic tasks) and +2A for gait. Pt has been severely limited by fatigue and lethargy during sessions  S to min A overall at w/c level  R NMR, alertness, attention, transfers, sitting balance, pt/family education   Communication     severe dysarthria, improved language  of confusion   min assist, downgraded   slow rate, increased vocal intensity     Safety/Cognition/ Behavioral Observations    Max to total   min assist   orientation, right inattention, focused attention to tasks, alertness    Pain     n/a  n/a  n/a   Skin     skin tear rt elbow with foam dressing   no new breakdown while hospitalized   reinforce skin inspection and attention to rt side to avoid injury    Rehab Goals Patient on target to meet rehab goals: No Rehab Goals Revised: goals may need to be downgraded *See Care Plan and progress notes for long and short-term goals.    Barriers to Discharge:  poor motor recovery      Possible Resolutions to Barriers:   see above , family training     Discharge Planning/Teaching Needs:   Pt to go to his home at d/c with dtr, son, and family friend taking turns being with pt.  Therapy team is concerned pt's care needs may be greater than what family can provide at home.  The above caregivers to come in for family education closer to pt's d/c.  Some family education has already begun.    Team Discussion:    Slow progress in therapies-increasing ritalin and sleep medicine. Still awake at night, sleeping during the day. Limited by fatigue. Dys 1 nectar thick liquids. Tone in fingers, elbow and shoulder. R-inattention. Incont B & B   Revisions to Treatment Plan:    Question if family can provide the care pt needs    Continued Need for Acute Rehabilitation Level of Care: The patient requires daily medical management by a physician with specialized training in physical medicine and rehabilitation for the following conditions: Daily direction of a multidisciplinary physical rehabilitation program to ensure safe treatment while eliciting the highest outcome that is of practical value to the patient.: Yes Daily medical management of patient stability for increased activity during participation in an intensive rehabilitation regime.: Yes Daily analysis of laboratory values and/or radiology reports with any subsequent need for medication adjustment of medical intervention for : Neurological problems  Lucy Chris 07/19/2014, 2:07 PM                  Patient ID: Michael Arbour, male   DOB: 1945-09-25, 69 y.o.   MRN: [id number]

## 2014-07-20 ENCOUNTER — Inpatient Hospital Stay (HOSPITAL_COMMUNITY): Payer: Medicare Other | Admitting: Speech Pathology

## 2014-07-20 ENCOUNTER — Inpatient Hospital Stay (HOSPITAL_COMMUNITY): Payer: Medicare Other | Admitting: Occupational Therapy

## 2014-07-20 ENCOUNTER — Inpatient Hospital Stay (HOSPITAL_COMMUNITY): Payer: Medicare Other

## 2014-07-20 MED ORDER — MIRTAZAPINE 15 MG PO TABS
15.0000 mg | ORAL_TABLET | Freq: Every day | ORAL | Status: DC
  Administered 2014-07-20 – 2014-07-31 (×11): 15 mg via ORAL
  Filled 2014-07-20 (×13): qty 1

## 2014-07-20 NOTE — Progress Notes (Signed)
Social Work Patient ID: Michael Goodwin, male   DOB: 12/25/1945, 69 y.o.   MRN: [id number] Spoke with Katie-daughter to discuss team conference progression toward his goals and discharge date 6/17.  According to [facility] the plan is to provide 24 hr care at home for pt. She is aware of the other option NHP since she currently works in one in [place].  She and her brother are planning to come in Wed at 9;00 am for family education.  They are aware it may take two  Days of training depending upon how Wed goes.  Asked questions regarding follow up therapies-will have home health but not as intensive.  Unsure if Florentina Addison realizes the amount of care pt will need and The difficulty trying to work and provide this care at home. Boneta Lucks to return on Tuesday she will follow up with.

## 2014-07-20 NOTE — Progress Notes (Signed)
Occupational Therapy Session Note  Patient Details  Name: Michael Goodwin MRN: [id number] Date of Birth: 12/09/1945  Today's Date: 07/20/2014 OT Individual Time: [phone] and 1430-1500 OT Individual Time Calculation (min): 59 min and 30 min   Short Term Goals: Week 2:  OT Short Term Goal 1 (Week 2): Pt will perform bathing with max A at sink side or seated EOB.  OT Short Term Goal 2 (Week 2): Pt will perform toilet transfer with max A of 1 to [facility] in order to decrease level of assist for functional transfers. OT Short Term Goal 3 (Week 2): Pt will engage in 5 minutes of static sitting balance with mod A in order to increase balance for functional tasks. OT Short Term Goal 4 (Week 2): Pt will perform UB dressing with mod A in order to increase I in self care.   Skilled Therapeutic Interventions/Progress Updates:  Session 1: Upon entering the room, pt supine in bed with no c/o pain. OT having difficulty waking pt from sleep and pr requiring coaxing for participation. LB bathing performed supine in bed. OT also assisting pt to donn compression socks, B shoes, and depends with total A . Pt rolling with Mod A to the L and R during session to assist. Stand pivot transfer to wheelchair from bed with total A as second helper needed for safety. Pt oriented to self and location only. He required max verbal cues and choice of 2 in order to be oriented to situation and time. Pt seated in wheelchair and engaging in feeding self. OT assisting pt by scooping food onto spoon and placing into L hand. Pt bringing food to mouth with minimal spillage noted. Pt verbalized when he did not want further food. R UE arm tray donned as well as QRB. Pt seated in wheelchair at RN station for safety.   Session 2: Pt received at RN station. Pt with no c/o pain during session and requesting, "I'm hungry." OT propelled wheelchair via total A to pt room in order to decrease external distractions for feeding. Pt scoops food onto utensil  with L hand and hand over hand assist. If spoon placed into L hand, pt will bring spoon to mouth and take multiple appropriate sized  Bites. Pt also taking sips from cup when cup placed into L hand he will bring to mouth. Pt engaging in functional conversation regarding grandchildren but unable to report their names or ages to therapist. Pt returned to RN with QRB donned and reclined back in wheelchair.   Therapy Documentation Precautions:  Precautions Precautions: Fall Precaution Comments: decreased attention, pusher to the right side, decreased trunk control and posture Restrictions Weight Bearing Restrictions: No Pain: Pain Assessment Pain Assessment: No/denies pain Pain Score: 0-No pain  See FIM for current functional status  Therapy/Group: Individual Therapy  Lowella Grip 07/20/2014, 12:40 PM

## 2014-07-20 NOTE — Progress Notes (Signed)
CC weak Subjective/Complaints: Has had poor sleep intermittently at night, pt not aware of his sleep cycle due to cognitive issues  Review of Systems - limited due to mental status  Neurological EFE:OFHQR side weak, pt unaware of deficits Objective: Vital Signs: Blood pressure 127/74, pulse 71, temperature 98.2 F (36.8 C), temperature source Oral, resp. rate 18, height 6' (1.829 m), weight 70.1 kg (154 lb 8.7 oz), SpO2 98 %. No results found. Results for orders placed or performed during the hospital encounter of 07/07/14 (from the past 72 hour(s))  Basic metabolic panel     Status: None   Collection Time: 07/19/14 12:32 PM  Result Value Ref Range   Sodium 136 135 - 145 mmol/L   Potassium 4.1 3.5 - 5.1 mmol/L   Chloride 102 101 - 111 mmol/L   CO2 27 22 - 32 mmol/L   Glucose, Bld 85 65 - 99 mg/dL   BUN 12 6 - 20 mg/dL   Creatinine, Ser 1.05 0.61 - 1.24 mg/dL   Calcium 9.5 8.9 - 10.3 mg/dL   GFR calc non Af Amer >60 >60 mL/min   GFR calc Af Amer >60 >60 mL/min    Comment: (NOTE) The eGFR has been calculated using the CKD EPI equation. This calculation has not been validated in all clinical situations. eGFR's persistently <60 mL/min signify possible Chronic Kidney Disease.    Anion gap 7 5 - 15     HEENT: normal Cardio: RRR and no murmur Resp: CTA B/L and unlabored GI: BS positive and NT, ND Extremity:  Pulses positive and No Edema Skin:   Intact Neuro: Lethargic,limitedMMT this am,2- Right knee ext, trace R biceps flexion, 4- in LUE and LLE   increased tone  and finger flexorsMusc/Skel:  Non knee joint welling or tenderness Other no pain with PROM of limbs Gen NAD   Assessment/Plan: 1. 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  Further titration of  methylphenidate and remeron to adjust sleep / wake cycle  FIM: FIM - Bathing Bathing Steps Patient Completed: Chest, Left Arm, Abdomen, Front perineal area Bathing: 2: Max-Patient completes 3-4 14f10 parts or 25-49%  FIM - Upper Body Dressing/Undressing Upper body dressing/undressing steps patient completed: Thread/unthread left sleeve of pullover shirt/dress Upper body dressing/undressing: 2: Max-Patient completed 25-49% of tasks FIM - Lower Body Dressing/Undressing Lower body dressing/undressing steps patient completed:  (socks only no other public clothing) Lower body dressing/undressing: 1: Two helpers  FIM - Toileting Toileting: 1: Two helpers  FIM - TRadio producerDevices: BRecruitment consultantTransfers: 1-Two helpers  FIM - BControl and instrumentation engineerDevices: HOB elevated, Bed rails Bed/Chair Transfer: 1: Chair or W/C > Bed: Total A (helper does all/Pt. < 25%), 1: Sit > Supine: Total A (helper does all/Pt. < 25%)  FIM - Locomotion: Wheelchair Distance: 55 Locomotion: Wheelchair: 1: Total Assistance/staff pushes wheelchair (Pt<25%) FIM - Locomotion: Ambulation Locomotion: Ambulation Assistive Devices: Other (comment) (three muskateer style) Ambulation/Gait Assistance: 1: +2 Total assist Locomotion: Ambulation: 0: Activity did not occur  Comprehension Comprehension Mode: Auditory Comprehension: 2-Understands basic 25 - 49% of the time/requires cueing 51 - 75% of the time  Expression Expression Mode: Verbal Expression: 2-Expresses basic 25 - 49% of the time/requires cueing 50 - 75% of the time. Uses single words/gestures.  Social Interaction Social Interaction:  2-Interacts appropriately 25 - 49% of time - Needs frequent redirection.  Problem Solving Problem Solving: 2-Solves basic 25 - 49% of the time - needs direction more than half the time to initiate, plan or complete simple activities  Memory Memory:  1-Recognizes or recalls less than 25% of the time/requires cueing greater than 75% of the time   Medical Problem List and Plan: 1. 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 DVT Prophylaxis/Anticoagulation: Subcutaneous heparin for DVT prophylaxis initiated 07/06/2014 3. Pain Management: Tylenol as needed 4. Dysphagia. Dysphagia 3 thin liquids. Monitor for any signs of aspiration. Follow-up speech therapy.IVFs for hydration due to decreased nutritional storage and hydration 5. Neuropsych: This patient is capable of making decisions on his own behalf. 6. Skin/Wound Care: Routine skin checks 7. Fluids/Electrolytes/Nutrition: Routine I/Os with follow-up chemistries 8. Hypertension. Lisinopril 20 mg daily.Uncontrolled on amlodipine as well may need to increase to 80m 9. Tobacco abuse, counseling  LOS (Days) 13 A FACE TO FACE EVALUATION WAS PERFORMED  Michael Goodwin,Michael Goodwin 07/20/2014, 9:20 AM

## 2014-07-20 NOTE — Progress Notes (Signed)
Speech Language Pathology Daily Session Note  Patient Details  Name: Michael Goodwin MRN: [id number] Date of Birth: 01/28/1946  Today's Date: 07/20/2014 SLP Individual Time: 1105-1205 SLP Individual Time Calculation (min): 60 min  Short Term Goals: Week 2: SLP Short Term Goal 1 (Week 2): Pt will tolerate dysphagia 1 with nectar thick liquids using swallow strategies with max A  verbal/visual/tactile cueing. SLP Short Term Goal 1 - Progress (Week 2): Progressing toward goal SLP Short Term Goal 2 (Week 2): Pt will focus attention to functional tasks for 30 seconds with max A  multimodal cueing. SLP Short Term Goal 2 - Progress (Week 2): Progressing toward goal SLP Short Term Goal 3 (Week 2): Pt will be oriented x4 with visual external aids with max A multimodal cueing. SLP Short Term Goal 3 - Progress (Week 2): Progressing toward goal SLP Short Term Goal 4 (Week 2): Pt will recall biographical information with max A multimodal cueing. SLP Short Term Goal 4 - Progress (Week 2): Progressing toward goal SLP Short Term Goal 5 (Week 2): Pt will demonstrate basic problem solving in functional tasks with max A  multimodal cueing. SLP Short Term Goal 5 - Progress (Week 2): Progressing toward goal  Skilled Therapeutic Interventions:  Pt was seen for skilled ST targeting goals for dysphagia and cognition.  Pt was received upright in wheelchair from the nursing station, awake, alert, and agreeable to participate in ST.   Pt was oriented to place, date, and situation with max assist multimodal cuing and intermittent choice of two.  SLP facilitated the session with a structured sorting task targeting sustained attention and visual scanning to the right.  Pt sorted colored blocks into two groups with max assist verbal, visual, and tactile cues. He sustained his attention to task for ~30 seconds before requiring mod-max multimodal cues for redirection.  He required total/hand over hand assist to recreate a pattern  when given a visual model.   Pt's sister was present for ~half of today's session and was seated behind pt so as not to distract him.  However, when sister came to tell pt good bye, pt required max assist verbal and visual cues to attend to her on his right side.  SLP facilitated the session with a trial meal tray of dys 2 textures to continue working towards diet advancement.  Pt required max to total assist to feed himself and maintain alertness during trials.  Pt was able to effectively masticate dys 2 textures with extra time; however, given fluctuating alertness do not recommend diet advancement at this time.  RN made aware. Continue per current plan of care.    FIM:  Comprehension Comprehension Mode: Auditory Comprehension: 2-Understands basic 25 - 49% of the time/requires cueing 51 - 75% of the time Expression Expression Mode: Verbal Expression: 2-Expresses basic 25 - 49% of the time/requires cueing 50 - 75% of the time. Uses single words/gestures. Social Interaction Social Interaction: 2-Interacts appropriately 25 - 49% of time - Needs frequent redirection. Problem Solving Problem Solving: 2-Solves basic 25 - 49% of the time - needs direction more than half the time to initiate, plan or complete simple activities Memory Memory: 1-Recognizes or recalls less than 25% of the time/requires cueing greater than 75% of the time FIM - Eating Eating Activity: 4: Helper checks for pocketed food;4: Help with picking up utensils;4: Helper occasionally scoops food on utensil;4: Help with managing cup/glass  Pain Pain Assessment Pain Assessment: No/denies pain  Therapy/Group: Individual Therapy  Addisson Frate, Melanee Spry  07/20/2014, 4:12 PM

## 2014-07-20 NOTE — Progress Notes (Signed)
Physical Therapy Session Note  Patient Details  Name: Michael Goodwin MRN: [id number] Date of Birth: 10/11/1945  Today's Date: 07/20/2014 PT Individual Time: 1006-1100 PT Individual Time Calculation (min): 54 min   Short Term Goals: Week 2:  PT Short Term Goal 1 (Week 2): Pt will perform bed mobility with mod A with 75% cues to attend to RUE/LE PT Short Term Goal 2 (Week 2): Pt will perform functional transfers R and L at mod A level with 75% cues for technique PT Short Term Goal 3 (Week 2): Pt will perform sit<>stand with mod A with 75% cues to attend to L environment PT Short Term Goal 4 (Week 2): Pt will perform dynamic standing tasks at max A level for 3 mins to increase independence with ADLs.  PT Short Term Goal 5 (Week 2): Pt will ambulate x 10' with LRAD and +2A for safety with pt assist approx 30%.   Skilled Therapeutic Interventions/Progress Updates:    Patient in wheelchair.  Total assist to set up transfer to mat.  Patient transferred to mat via stand pivot mod assist with max cues and set up for right LE placement.  Seated on mat for reaching and tossing activity working on dynamic balance, right side attention and awareness, right UE reaching with hand over hand assist and trunk flexion and elongation. Patient required supervision and cues about 70% for regaining balance, min assist 30%.  Patient sit to stand mod assist after right foot placement.  Gait 3 muskateer's technique x 75' with one 180 degree turn +2 total assist pt about 30% (assist for right swing and stance stability, facilitation through pelvis for left weight shift and cues for slowing down, sequence.)  In hallway, gait with left hand on wall rail x 20' max assist with one to follow with w/c as previous with cues/assist/facilitation.  Patient left up in tilt recline chair with quick release belt at nurses station.  Therapy Documentation Precautions:  Precautions Precautions: Fall Precaution Comments: decreased  attention, pusher to the right side, decreased trunk control and posture Restrictions Weight Bearing Restrictions: No Pain: Pain Assessment Pain Assessment: No/denies pain Pain Score: 0-No pain Locomotion : Ambulation Ambulation/Gait Assistance: 1: +2 Total assist   See FIM for current functional status  Therapy/Group: Individual Therapy  Rudi Coco [place], [place] [phone] 07/20/2014  07/20/2014, 12:34 PM

## 2014-07-21 ENCOUNTER — Inpatient Hospital Stay (HOSPITAL_COMMUNITY): Payer: Medicare Other | Admitting: Rehabilitation

## 2014-07-21 ENCOUNTER — Inpatient Hospital Stay (HOSPITAL_COMMUNITY): Payer: Medicare Other | Admitting: Occupational Therapy

## 2014-07-21 ENCOUNTER — Inpatient Hospital Stay (HOSPITAL_COMMUNITY): Payer: Medicare Other

## 2014-07-21 MED ORDER — ENSURE ENLIVE PO LIQD
237.0000 mL | Freq: Two times a day (BID) | ORAL | Status: DC
  Administered 2014-07-21 – 2014-07-31 (×18): 237 mL via ORAL

## 2014-07-21 NOTE — Progress Notes (Signed)
Initial Nutrition Assessment  DOCUMENTATION CODES:  Non-severe (moderate) malnutrition in context of chronic illness   Pt meets criteria for MODERATE MALNUTRITION in the context of chronic illness as evidenced by moderate to severe fat mass loss and moderate muscle mass loss.  INTERVENTION:  Ensure Enlive (each supplement provides 350kcal and 20 grams of protein) (BID) thicken to nectar thick.  Encourage adequate PO intake.   NUTRITION DIAGNOSIS:  Increased nutrient needs related to  (therapy) as evidenced by estimated needs.  GOAL:  Patient will meet greater than or equal to 90% of their needs  MONITOR:  PO intake, Supplement acceptance, Weight trends, Labs, I & O's, Diet advancement  REASON FOR ASSESSMENT:  Malnutrition Screening Tool    ASSESSMENT: Pt with history of tobacco abuse, hypertension. Presented 07/04/2014 with right-sided weakness and altered mental status/ code stroke. CT the head showed left ICH with intraventricular extension.  Pt is currently on a dysphagia 1 diet with nectar thick liquids. Pt reports having a good appetite currently and PTA at home with no other difficulties. Current meal completion has been 50-100%. Pt reports usual weight to be ~154 lbs. Pt is agreeable to Ensure to aid in caloric and protein needs. RD to order. Pt was encouraged to eat his food at meals and to consume his supplements.   Nutrition-Focused physical exam completed. Findings are moderate to severe fat depletion, moderate muscle depletion, and no edema.   Labs and medication reviewed.   Height:  Ht Readings from Last 1 Encounters:  07/13/14 6' (1.829 m)    Weight:  Wt Readings from Last 1 Encounters:  07/19/14 154 lb 8.7 oz (70.1 kg)    Ideal Body Weight:  81 kg  Wt Readings from Last 10 Encounters:  07/19/14 154 lb 8.7 oz (70.1 kg)  07/04/14 142 lb 12.8 oz (64.774 kg)    BMI:  Body mass index is 20.96 kg/(m^2).  Estimated Nutritional Needs:  Kcal:   1850-2100  Protein:  90-100 grams  Fluid:  1.9 - 2.1 L/day  Skin:  Reviewed, no issues  Diet Order:  DIET - DYS 1 Room service appropriate?: Yes; Fluid consistency:: Nectar Thick  EDUCATION NEEDS:  No education needs identified at this time   Intake/Output Summary (Last 24 hours) at 07/21/14 1355 Last data filed at 07/21/14 1259  Gross per 24 hour  Intake    720 ml  Output    600 ml  Net    120 ml    Last BM:  6/7  Roslyn Smiling, MS, RD, LDN Pager # [phone] After hours/ weekend pager # [phone]

## 2014-07-21 NOTE — Progress Notes (Signed)
Occupational Therapy Weekly Progress Note  Patient Details  Name: Michael Goodwin MRN: [id number] Date of Birth: [birth year]  Beginning of progress report period: July 14, 2014 End of progress report period: July 21, 2014  Today's Date: 07/21/2014 OT Individual Time:0900  - 1000   60 min   Patient has met 1 of 4 short term goals. Pt making slow progress this week towards OT goals. Pt has become more alert during sessions secondary to medication changes. Pt initiating transfers but continues to require total A for functional transfer. Pt utilizing roll in shower chair for bathing or washing LB supine in bed with mod - max A balance seated EOB for UB dressing. Pt will continue to benefit from OT services.   Patient continues to demonstrate the following deficits: decreased I in self care, decreased awareness, cognition, functional strength and ROM in R UE/LE, decrease balance, functional transfer/mobility decreased postural control and therefore will continue to benefit from skilled OT intervention to enhance overall performance with BADL.  Patient progressing toward long term goals..  Continue plan of care.  OT Short Term Goals Week 2:  OT Short Term Goal 1 (Week 2): Pt will perform bathing with max A at sink side or seated EOB.  OT Short Term Goal 1 - Progress (Week 2): Not met OT Short Term Goal 2 (Week 2): Pt will perform toilet transfer with max A of 1 to [facility] in order to decrease level of assist for functional transfers. OT Short Term Goal 2 - Progress (Week 2): Not met OT Short Term Goal 3 (Week 2): Pt will engage in 5 minutes of static sitting balance with mod A in order to increase balance for functional tasks. OT Short Term Goal 3 - Progress (Week 2): Met OT Short Term Goal 4 (Week 2): Pt will perform UB dressing with mod A in order to increase I in self care.  OT Short Term Goal 4 - Progress (Week 2): Progressing toward goal Week 3:  OT Short Term Goal 1 (Week 3): STGs= LTGs secondary  to planned upcoming discharge  Skilled Therapeutic Interventions/Progress Updates:  Upon entering the room, pt supine in bed with no c/o pain. Pt preformed supine >sit with Max A. Total A transfer from bed >roll in shower chair. Pt initiated movement but continues to push during transfer. Pt assisted via roll in shower chair with total A for bathing. Pt transferred into tilt in space wheelchair with max A . STS from wheelchair with mod A progressing to total A during LB clothing management from second helper. Pt seated in wheelchair with QRB donned and tilted in wheelchair. Pt moved to sit at RN station for safety.    Therapy Documentation Precautions:  Precautions Precautions: Fall Precaution Comments: decreased attention, pusher to the right side, decreased trunk control and posture Restrictions Weight Bearing Restrictions: No  See FIM for current functional status  Therapy/Group: Individual Therapy  Phineas Semen 07/21/2014, 12:21 PM

## 2014-07-21 NOTE — Progress Notes (Signed)
Physical Therapy Session Note  Patient Details  Name: Michael Goodwin MRN: [id number] Date of Birth: [birth year]  Today's Date: 07/21/2014 PT Individual Time: 1618-1700 PT Individual Time Calculation (min): 42 min   Short Term Goals: Week 3:     Skilled Therapeutic Interventions/Progress Updates:   Pt received lying in bed asleep, needed max verbal and tactile cues to arouse.  Performed bed mobility with max A and total A cues for sequencing and technique.  Once at EOB, assisted with donning shoes with single UE support at min A level.  Skilled session focused on functional transfers, attention, alertness, dynamic sitting balance and gait.  Performed bed>w/c and w/c<>mat at mod A level with therapist in chair anterior to pt and allowing pt to use LUE on arm rest to facilitate forward weight shift and trunk lean.  While on mat, utilized music to engage with pt and attempted to have pt name song or name of song.  Pt very fatigued and kept eyes closed approx 60% of session and also with decreased intelligible language.  Pt then stating needed to use restroom and pulling at pants, therefore assisted back to w/c and back to room.  Pt then stating he did not have to void.  Ended session with short distance gait x 30' with "three muskateer style" with PT assisting at pelvic for upright posture, trunk and hips for weight shifting and at R knee to stabilize during stance.  Pt requires max verbal cues to take increased L step length for increased WB through RLE.  Continue to note tone in RLE making placement very difficult.  Pt left in w/c with quick release belt donned and at nursing station for increased supervision.   Therapy Documentation Precautions:  Precautions Precautions: Fall Precaution Comments: decreased attention, pusher to the right side, decreased trunk control and posture Restrictions Weight Bearing Restrictions: No   Vital Signs: Therapy Vitals Temp: 97.4 F (36.3 C) Temp Source:  Oral Pulse Rate: 92 Resp: 18 BP: (!) 88/62 mmHg Patient Position (if appropriate): Sitting Oxygen Therapy SpO2: 92 % O2 Device: Not Delivered Pain: Pain Assessment Pain Assessment: No/denies pain   Locomotion : Ambulation Ambulation/Gait Assistance: 1: +2 Total assist   See FIM for current functional status  Therapy/Group: Individual Therapy  Vista Deck 07/21/2014, 5:54 PM

## 2014-07-21 NOTE — Progress Notes (Signed)
Physical Therapy Session Note  Patient Details  Name: Michael Goodwin MRN: [id number] Date of Birth: 09/15/1945  Today's Date: 07/21/2014 PT Individual Time: 1130-1200 PT Individual Time Calculation (min): 30 min   Short Term Goals: Week 2:  PT Short Term Goal 1 (Week 2): Pt will perform bed mobility with mod A with 75% cues to attend to RUE/LE PT Short Term Goal 2 (Week 2): Pt will perform functional transfers R and L at mod A level with 75% cues for technique PT Short Term Goal 3 (Week 2): Pt will perform sit<>stand with mod A with 75% cues to attend to L environment PT Short Term Goal 4 (Week 2): Pt will perform dynamic standing tasks at max A level for 3 mins to increase independence with ADLs.  PT Short Term Goal 5 (Week 2): Pt will ambulate x 10' with LRAD and +2A for safety with pt assist approx 30%.   Skilled Therapeutic Interventions/Progress Updates:   Pt received sitting in w/c in room with family, agreeable to therapy session.  Assisted to/from therapy gym via w/c at total A level due to being in tilt in space chair.  Once in therapy gym performed squat pivot transfers w/c<>therapy mat at mod A level with LUE placed on arm rest of chair placed in front of him (where therapist was seated) for increased facilitation of forward weight shift.  Continues to require assist and facilitation for forward trunk lean as well as placement of LEs, however continues to demonstrate improved initiation during transfers.  Once seated at [facility], focused on dynamic sit<>mini stands for increased forward weight shift, increased L lateral weight shift to decrease pusher tendencies, as well as for increased WB through RLE.  Requires +2A to assist for forward weight shift, stabilization of R and LLE as well as assist initially to extend and attempt WB through RUE.  Did not tolerate WB through arm, therefore discontinued task and assisted at hips and pelvis for increased ant pelvic tilt prior to forward trunk lean.   Tolerated well with c/o fatigue at end of session.  Transferred back to w/c and left at nursing station with quick release belt donned for safety.    Therapy Documentation Precautions:  Precautions Precautions: Fall Precaution Comments: decreased attention, pusher to the right side, decreased trunk control and posture Restrictions Weight Bearing Restrictions: No  Pain: Pt with no reports of pain during session.    Locomotion : Wheelchair Mobility Distance: 60   See FIM for current functional status  Therapy/Group: Individual Therapy  Vista Deck 07/21/2014, 12:30 PM

## 2014-07-21 NOTE — Progress Notes (Signed)
Speech Language Pathology Daily Session Note  Patient Details  Name: Michael Goodwin MRN: [id number] Date of Birth: [birth year]  Today's Date: 07/21/2014 SLP Individual Time: 1300-1400 SLP Individual Time Calculation (min): 60 min  Short Term Goals: Week 2: SLP Short Term Goal 1 (Week 2): Pt will tolerate dysphagia 1 with nectar thick liquids using swallow strategies with max A  verbal/visual/tactile cueing. SLP Short Term Goal 1 - Progress (Week 2): Progressing toward goal SLP Short Term Goal 2 (Week 2): Pt will focus attention to functional tasks for 30 seconds with max A  multimodal cueing. SLP Short Term Goal 2 - Progress (Week 2): Progressing toward goal SLP Short Term Goal 3 (Week 2): Pt will be oriented x4 with visual external aids with max A multimodal cueing. SLP Short Term Goal 3 - Progress (Week 2): Progressing toward goal SLP Short Term Goal 4 (Week 2): Pt will recall biographical information with max A multimodal cueing. SLP Short Term Goal 4 - Progress (Week 2): Progressing toward goal SLP Short Term Goal 5 (Week 2): Pt will demonstrate basic problem solving in functional tasks with max A  multimodal cueing. SLP Short Term Goal 5 - Progress (Week 2): Progressing toward goal  Skilled Therapeutic Interventions: Pt was seen for skilled ST targeting goals for dysphagia and cognition.  Sister and friend present for session.  Pt required max assist to sustain attention and organize information for card sorting and sequencing task.  Max verbal cues were necessary to increase vocal intensity and improve pacing for speech clarity.   Hand-over-hand assist needed to open containers; max assist for self-feeding and safety with consumption of nectar-thick liquids.  Continue plan of care.    FIM:   (text will not populate despite multiple attempts)  Pain Pain Assessment Pain Assessment: No/denies pain Pain Score: 3  (Reassessment)  Therapy/Group: Individual Therapy  Blenda Mounts  Laurice 07/21/2014, 5:28 PM

## 2014-07-21 NOTE — Progress Notes (Signed)
Subjective/Complaints: Still slow to arouse. Slept some per staff  Review of Systems - remains very limited due to mental status  Neurological LDJ:TTSVX side weak, pt unaware of deficits Objective: Vital Signs: Blood pressure 120/79, pulse 56, temperature 98.3 F (36.8 C), temperature source Oral, resp. rate 18, height 6' (1.829 m), weight 70.1 kg (154 lb 8.7 oz), SpO2 99 %. No results found. Results for orders placed or performed during the hospital encounter of 07/07/14 (from the past 72 hour(s))  Basic metabolic panel     Status: None   Collection Time: 07/19/14 12:32 PM  Result Value Ref Range   Sodium 136 135 - 145 mmol/L   Potassium 4.1 3.5 - 5.1 mmol/L   Chloride 102 101 - 111 mmol/L   CO2 27 22 - 32 mmol/L   Glucose, Bld 85 65 - 99 mg/dL   BUN 12 6 - 20 mg/dL   Creatinine, Ser 1.05 0.61 - 1.24 mg/dL   Calcium 9.5 8.9 - 10.3 mg/dL   GFR calc non Af Amer >60 >60 mL/min   GFR calc Af Amer >60 >60 mL/min    Comment: (NOTE) The eGFR has been calculated using the CKD EPI equation. This calculation has not been validated in all clinical situations. eGFR's persistently <60 mL/min signify possible Chronic Kidney Disease.    Anion gap 7 5 - 15     HEENT: normal Cardio: RRR and no murmur Resp: CTA B/L and unlabored GI: BS positive and NT, ND Extremity:  Pulses positive and No Edema Skin:   Intact Neuro: Lethargic,limitedMMT this am,2- to 2/5 Right knee ext, trace R biceps flexion, 4- in LUE and LLE   increased tone  and finger flexorsMusc/Skel:  Non knee joint welling or tenderness Other no pain with PROM of limbs Gen NAD   Assessment/Plan: 1. 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     FIM: FIM - Bathing Bathing Steps Patient Completed: Chest, Left Arm,  Abdomen, Front perineal area Bathing: 2: Max-Patient completes 3-4 [id number] parts or 25-49%  FIM - Upper Body Dressing/Undressing Upper body dressing/undressing steps patient completed: Thread/unthread left sleeve of pullover shirt/dress Upper body dressing/undressing: 0: Wears gown/pajamas-no public clothing FIM - Lower Body Dressing/Undressing Lower body dressing/undressing steps patient completed:  (socks only no other public clothing) Lower body dressing/undressing: 0: Wears gInterior and spatial designer FIM - TMusicianDevices: Grab bar or rail for support Toileting: 1: Two helpers  FIM - TRadio producerDevices: GProduct managerTransfers: 3-From toilet/BSC: Mod A (lift or lower assist), 3-To toilet/BSC: Mod A (lift or lower assist)  FIM - BControl and instrumentation engineerDevices: HOB elevated, Bed rails Bed/Chair Transfer: 3: Bed > Chair or W/C: Mod A (lift or lower assist), 3: Chair or W/C > Bed: Mod A (lift or lower assist)  FIM - Locomotion: Wheelchair Distance: 55 Locomotion: Wheelchair: 1: Total Assistance/staff pushes wheelchair (Pt<25%) FIM - Locomotion: Ambulation Locomotion: Ambulation Assistive Devices: Other (comment) (three muskateers versus wall rail) Ambulation/Gait Assistance: 1: +2 Total assist Locomotion: Ambulation: 1: Travels less than 50 ft with total assistance/helper does all (Pt.<25%)  Comprehension Comprehension Mode: Auditory Comprehension: 2-Understands basic 25 - 49% of the time/requires cueing 51 - 75% of the time  Expression Expression Mode: Verbal Expression: 2-Expresses basic 25 - 49% of  the time/requires cueing 50 - 75% of the time. Uses single words/gestures.  Social Interaction Social Interaction: 2-Interacts appropriately 25 - 49% of time - Needs frequent redirection.  Problem Solving Problem Solving: 2-Solves basic 25 - 49% of the time - needs direction more than half  the time to initiate, plan or complete simple activities  Memory Memory: 1-Recognizes or recalls less than 25% of the time/requires cueing greater than 75% of the time   Medical Problem List and Plan: 1. 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 DVT Prophylaxis/Anticoagulation: Subcutaneous heparin for DVT prophylaxis initiated 07/06/2014 3. Pain Management: Tylenol as needed 4. Dysphagia. Dysphagia 3 thin liquids. Monitor for any signs of aspiration. Follow-up speech therapy. IVFs for hydration due to decreased nutritional storage and hydration 5. Neuropsych: This patient is not capable of making decisions on his own behalf. 6. Skin/Wound Care: Routine skin checks 7. Fluids/Electrolytes/Nutrition:  Intake inconsistent. Checking labs again on Monday. ivf 8. Hypertension. Lisinopril 20 mg daily, norvasc: improved control. 9. Tobacco abuse, counseling 10. Sleep-wake: ritalin and remeron for wake/sleep  -sleep chart  LOS (Days) 14 A FACE TO FACE EVALUATION WAS PERFORMED  Michael Goodwin T 07/21/2014, 10:28 AM

## 2014-07-22 ENCOUNTER — Inpatient Hospital Stay (HOSPITAL_COMMUNITY): Payer: Medicare Other | Admitting: Rehabilitation

## 2014-07-22 MED ORDER — AMLODIPINE BESYLATE 5 MG PO TABS
5.0000 mg | ORAL_TABLET | Freq: Every day | ORAL | Status: DC
  Administered 2014-07-23 – 2014-08-01 (×10): 5 mg via ORAL
  Filled 2014-07-22 (×12): qty 1

## 2014-07-22 MED ORDER — LISINOPRIL 10 MG PO TABS
10.0000 mg | ORAL_TABLET | Freq: Two times a day (BID) | ORAL | Status: DC
  Administered 2014-07-22 – 2014-07-24 (×5): 10 mg via ORAL
  Filled 2014-07-22 (×8): qty 1

## 2014-07-22 NOTE — Plan of Care (Signed)
Problem: RH Eating Goal: LTG Patient will perform eating w/assist, cues/equip (OT) LTG: Patient will perform eating with assist, with/without cues using equipment (OT)  Downgraded due to lack of progress  Problem: RH Grooming Goal: LTG Patient will perform grooming w/assist,cues/equip (OT) LTG: Patient will perform grooming with assist, with/without cues using equipment (OT)  Downgraded due to lack of progress  Problem: RH Dressing Goal: LTG Patient will perform upper body dressing (OT) LTG Patient will perform upper body dressing with assist, with/without cues (OT).  Downgraded due to lack of progress Goal: LTG Patient will perform lower body dressing w/assist (OT) LTG: Patient will perform lower body dressing with assist, with/without cues in positioning using equipment (OT)  Downgraded due to lack of progress  Problem: RH Toileting Goal: LTG Patient will perform toileting w/assist, cues/equip (OT) LTG: Patient will perform toiletiing (clothes management/hygiene) with assist, with/without cues using equipment (OT)  Downgraded due to lack of progress  Problem: RH Toilet Transfers Goal: LTG Patient will perform toilet transfers w/assist (OT) LTG: Patient will perform toilet transfers with assist, with/without cues using equipment (OT)  Downgraded due to lack of progress  Problem: RH Tub/Shower Transfers Goal: LTG Patient will perform tub/shower transfers w/assist (OT) LTG: Patient will perform tub/shower transfers with assist, with/without cues using equipment (OT)  Downgraded due to lack of progress

## 2014-07-22 NOTE — Progress Notes (Addendum)
Subjective/Complaints: bp's lower last night. Pt hard to arouse overnight per RN---arrived and patient was sitting eob. Trying to get OOB on his own Review of Systems - remains very limited due to mental status    Objective: Vital Signs: Blood pressure 123/76, pulse 99, temperature 97.4 F (36.3 C), temperature source Oral, resp. rate 18, height 6' (1.829 m), weight 70.1 kg (154 lb 8.7 oz), SpO2 99 %. No results found. Results for orders placed or performed during the hospital encounter of 07/07/14 (from the past 72 hour(s))  Basic metabolic panel     Status: None   Collection Time: 07/19/14 12:32 PM  Result Value Ref Range   Sodium 136 135 - 145 mmol/L   Potassium 4.1 3.5 - 5.1 mmol/L   Chloride 102 101 - 111 mmol/L   CO2 27 22 - 32 mmol/L   Glucose, Bld 85 65 - 99 mg/dL   BUN 12 6 - 20 mg/dL   Creatinine, Ser 1.05 0.61 - 1.24 mg/dL   Calcium 9.5 8.9 - 10.3 mg/dL   GFR calc non Af Amer >60 >60 mL/min   GFR calc Af Amer >60 >60 mL/min    Comment: (NOTE) The eGFR has been calculated using the CKD EPI equation. This calculation has not been validated in all clinical situations. eGFR's persistently <60 mL/min signify possible Chronic Kidney Disease.    Anion gap 7 5 - 15     HEENT: normal Cardio: RRR and no murmur Resp: CTA B/L and unlabored GI: BS positive and NT, ND Extremity:  Pulses positive and No Edema Skin:   Intact Neuro:more alert. Minimal insight and awareness. Knew he was in hospital when given choices. Distracted. Fair sitting balance. 2- to 2/5 Right knee ext, trace R biceps flexion, 4- in LUE and LLE   increased tone  and finger flexors Musc/Skel:  No pain with PROM of limbs today Gen NAD. More alert   Assessment/Plan: 1. 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     FIM: FIM - Bathing Bathing Steps Patient Completed: Chest, Left Arm, Abdomen, Front perineal area Bathing: 2: Max-Patient completes 3-4 29f10 parts or 25-49%  FIM - Upper Body Dressing/Undressing Upper body dressing/undressing steps patient completed: Thread/unthread left sleeve of pullover shirt/dress Upper body dressing/undressing: 2: Max-Patient completed 25-49% of tasks FIM - Lower Body Dressing/Undressing Lower body dressing/undressing steps patient completed:  (socks only no other public clothing) Lower body dressing/undressing: 1: Two helpers  FIM - TMusicianDevices: Grab bar or rail for support Toileting: 1: Two helpers  FIM - TRadio producerDevices: GProduct managerTransfers: 3-From toilet/BSC: Mod A (lift or lower assist), 3-To toilet/BSC: Mod A (lift or lower assist)  FIM - BControl and instrumentation engineerDevices: HOB elevated, Bed rails Bed/Chair Transfer: 3: Sit > Supine: Mod A (lifting assist/Pt. 50-74%/lift 2 legs), 3: Bed > Chair or W/C: Mod A (lift or lower assist) (w/c<>therapy mat)  FIM - Locomotion: Wheelchair Distance: 60 Locomotion: Wheelchair: 1: Total Assistance/staff pushes wheelchair (Pt<25%) FIM - Locomotion: Ambulation Locomotion: Ambulation Assistive Devices: Other (comment) (three muskateer) Ambulation/Gait Assistance: 1: +2 Total assist Locomotion: Ambulation: 1: Two helpers  Comprehension Comprehension Mode: Asleep Comprehension: 2-Understands basic 25 - 49% of the time/requires cueing 51 - 75% of the time  Expression Expression Mode:  Asleep Expression: 2-Expresses basic 25 - 49% of the time/requires cueing 50 - 75% of the time. Uses single words/gestures.  Social Interaction Social Interaction Mode: Asleep Social Interaction: 2-Interacts appropriately 25 - 49% of time - Needs frequent redirection.  Problem Solving Problem Solving Mode: Asleep Problem  Solving: 2-Solves basic 25 - 49% of the time - needs direction more than half the time to initiate, plan or complete simple activities  Memory Memory Mode: Asleep Memory: 1-Recognizes or recalls less than 25% of the time/requires cueing greater than 75% of the time   Medical Problem List and Plan: 1. 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 DVT Prophylaxis/Anticoagulation: Subcutaneous heparin for DVT prophylaxis initiated 07/06/2014 3. Pain Management: Tylenol as needed 4. Dysphagia. Dysphagia 3 thin liquids. Monitor for any signs of aspiration. Follow-up speech therapy.  5. Neuropsych: This patient is not capable of making decisions on his own behalf. 6. Skin/Wound Care: Routine skin checks 7. Fluids/Electrolytes/Nutrition:  Intake inconsistent. Check labs again on Monday.   -no IVF due to cognition 8. Hypertension. Lisinopril 20 mg daily, norvasc 25m. Reduce given hypotension. 9. Tobacco abuse, counseling when appropriate 10. Sleep-wake: ritalin and remeron for wake/sleep  -sleep chart  -more alert today----continue with current plan---no further work up indicated at present  LOS (Days) 15 A FACE TO FACE EVALUATION WAS PERFORMED  Michael Goodwin T 07/22/2014, 9:12 AM

## 2014-07-22 NOTE — Progress Notes (Signed)
Physical Therapy Session Note  Patient Details  Name: Michael Goodwin MRN: [id number] Date of Birth: 07-09-1945  Today's Date: 07/22/2014 PT Individual Time: 1100-1155 PT Individual Time Calculation (min): 55 min   Short Term Goals: Week 2:  PT Short Term Goal 1 (Week 2): Pt will perform bed mobility with mod A with 75% cues to attend to RUE/LE PT Short Term Goal 2 (Week 2): Pt will perform functional transfers R and L at mod A level with 75% cues for technique PT Short Term Goal 3 (Week 2): Pt will perform sit<>stand with mod A with 75% cues to attend to L environment PT Short Term Goal 4 (Week 2): Pt will perform dynamic standing tasks at max A level for 3 mins to increase independence with ADLs.  PT Short Term Goal 5 (Week 2): Pt will ambulate x 10' with LRAD and +2A for safety with pt assist approx 30%.   Skilled Therapeutic Interventions/Progress Updates:   Pt received lying in bed, disoriented and confused, therefore provided total A cues for orientation and deficits, however pt continues to state "I want to go home."  Performed bed mobility at mod A level with cues for lowering LEs out of bed and assisting to elevate trunk with LUE.  Once at EOB, assisted with placement of LUE on arm rest in front of him to facilitate forward weight shift and increase WB through LEs.  Assisted to/from therapy gym via w/c at total A level for time management and energy conservation.  Skilled session focused on standing via standing frame for activity tolerance, increased sustained attention, alertness, midline orientation, postural control, weight shifting to the L to decrease pusher tendencies and scanning R environment.  Allowed sling to be somewhat loosened during session to increase pts activation in glutes/quads for upright posture and placed objects to retrieve or place to the far L.  Note very good activation of RLE and L weight shift when object placed far enough to the L.  Performed 3 bouts of standing x  8 mins with seated rest breaks in between and facilitation at hips and chest for postural alignment and increased anterior pelvic tilt.  Pt also able to scan R and identify color horse shoe called out by therapist with min cues 100% of time.  Assisted pt back to w/c and left at nursing station with quick release belt donned for increased safety.  RN aware.   Therapy Documentation Precautions:  Precautions Precautions: Fall Precaution Comments: decreased attention, pusher to the right side, decreased trunk control and posture Restrictions Weight Bearing Restrictions: No Vital Signs: Therapy Vitals Temp: 97.4 F (36.3 C) Temp Source: Oral Pulse Rate: 99 Resp: 18 BP: 114/82 mmHg Patient Position (if appropriate): Lying Oxygen Therapy SpO2: 99 % O2 Device: Not Delivered Pain: Pt with mild c/o pain in side, however no complaints during reaching or standing tasks.   See FIM for current functional status  Therapy/Group: Individual Therapy  Vista Deck 07/22/2014, 7:43 AM

## 2014-07-22 NOTE — Plan of Care (Signed)
Problem: RH Balance Goal: LTG Patient will maintain dynamic sitting balance (PT) LTG: Patient will maintain dynamic sitting balance with assistance during mobility activities (PT)  Downgraded due to lack of progress  Problem: RH Bed Mobility Goal: LTG Patient will perform bed mobility with assist (PT) LTG: Patient will perform bed mobility with assistance, with/without cues (PT).  Downgraded due to lack of progress.   Problem: RH Bed to Chair Transfers Goal: LTG Patient will perform bed/chair transfers w/assist (PT) LTG: Patient will perform bed/chair transfers with assistance, with/without cues (PT).  Downgraded due to lack of progress  Problem: RH Ambulation Goal: LTG Patient will ambulate in controlled environment (PT) LTG: Patient will ambulate in a controlled environment, # of feet with assistance (PT).  Downgraded due to lack of progress.   Problem: RH Wheelchair Mobility Goal: LTG Patient will propel w/c in controlled environment (PT) LTG: Patient will propel wheelchair in controlled environment, # of feet with assist (PT)  Downgraded due to lack of progress.

## 2014-07-22 NOTE — Progress Notes (Signed)
Lethargic, able to arouse, but immediately falls back asleep. Unable to give scheduled meds. Vitals-88/52 (manual), 98% RA, HR-70, Temp. 97.9 (AX), resp 16. No PO intake thus far on this shift. Paged Dr. Riley Kill in relation to above info, ok to hold meds. Condom cath in place. Michael Goodwin

## 2014-07-23 ENCOUNTER — Inpatient Hospital Stay (HOSPITAL_COMMUNITY): Payer: Medicare Other | Admitting: Physical Therapy

## 2014-07-23 NOTE — Progress Notes (Signed)
Physical Therapy Session Note  Patient Details  Name: Michael Goodwin MRN: [id number] Date of Birth: May 09, 1945  Today's Date: 07/23/2014 PT Individual Time: 1030-1100 PT Individual Time Calculation (min): 30 min   Short Term Goals: Week 1:  PT Short Term Goal 1 (Week 1): patient will be able to perform supine to sit transfer with min A. PT Short Term Goal 1 - Progress (Week 1): Progressing toward goal PT Short Term Goal 2 (Week 1): Patient will be able to maintain sitting EOB with  min A and reduced R side push/lean. PT Short Term Goal 2 - Progress (Week 1): Progressing toward goal PT Short Term Goal 3 (Week 1): Patient will be able to perform sit to stand transfer with mod A and maintain standing with mod A for up to 1 min PT Short Term Goal 3 - Progress (Week 1): Progressing toward goal PT Short Term Goal 4 (Week 1): Patient will be able to perform transfer to w/c with mod A PT Short Term Goal 4 - Progress (Week 1): Progressing toward goal PT Short Term Goal 5 (Week 1): Patient will be able to ambulate with LRAD on a distance of 10 feet with mod A PT Short Term Goal 5 - Progress (Week 1): Progressing toward goal Week 2:  PT Short Term Goal 1 (Week 2): Pt will perform bed mobility with mod A with 75% cues to attend to RUE/LE PT Short Term Goal 2 (Week 2): Pt will perform functional transfers R and L at mod A level with 75% cues for technique PT Short Term Goal 3 (Week 2): Pt will perform sit<>stand with mod A with 75% cues to attend to L environment PT Short Term Goal 4 (Week 2): Pt will perform dynamic standing tasks at max A level for 3 mins to increase independence with ADLs.  PT Short Term Goal 5 (Week 2): Pt will ambulate x 10' with LRAD and +2A for safety with pt assist approx 30%.   Skilled Therapeutic Interventions/Progress Updates:    Pt able to largely maintain midline in sitting tasks without back support, but more limited performing in standing activities. Best work done in  standing when reaching L and anterior for W/C. Pt would continue to benefit from skilled PT services to increase functional mobility.  Therapy Documentation Precautions:  Precautions Precautions: Fall Precaution Comments: decreased attention, pusher to the right side, decreased trunk control and posture Restrictions Weight Bearing Restrictions: No General:   Vital Signs: Therapy Vitals Temp: 98.4 F (36.9 C) Temp Source: Oral Pulse Rate: 96 Resp: 18 BP: 107/71 mmHg Patient Position (if appropriate): Sitting Oxygen Therapy SpO2: 98 % O2 Device: Not Delivered Pain: Pain Assessment Pain Assessment: No/denies pain Mobility:   Pt performs transfers with Mod A with cues for weight shift, LE placement, sequencing, and safety Other Treatments:  Pt performs unsupported sitting as active rest. Pt educated on safety in mobility, rehab plan, and attending to tasks. Pt performs anterior weight shifts AAROM, reaching laterally, reaching laterally with LUE, reaching superiorly, and B/L weight shifting in standing. Static standing 1'x5 including 5 trials reaching for wheelchair to L and anterior. Transfers x5 in session. Behavior management includes frequent cog breaks, low stim environment, and redirection.  See FIM for current functional status  Therapy/Group: Individual Therapy  Christia Reading 07/23/2014, 3:45 PM

## 2014-07-23 NOTE — Progress Notes (Signed)
Subjective/Complaints: No new issues. Night was unremarkable.  Review of Systems - no sob, cough, n/v/d/pain.     Objective: Vital Signs: Blood pressure 124/69, pulse 63, temperature 98.4 F (36.9 C), temperature source Oral, resp. rate 17, height 6' (1.829 m), weight 70.1 kg (154 lb 8.7 oz), SpO2 98 %. No results found. No results found for this or any previous visit (from the past 72 hour(s)).   HEENT: normal Cardio: RRR and no murmur Resp: CTA B/L and unlabored GI: BS positive and NT, ND Extremity:  Pulses positive and No Edema Skin:   Intact Neuro: MUCHmore alert. Better eye contact and attention. knew he was in hospital only when given choices. Realizes that he's "not himself".  2- to 2/5 Right knee ext, trace R biceps flexion, 4- in LUE and LLE  Musc/Skel:  No pain with PROM of limbs today Gen NAD.     Assessment/Plan: 1. 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     FIM: FIM - Bathing Bathing Steps Patient Completed: Chest, Left Arm, Abdomen, Front perineal area Bathing: 2: Max-Patient completes 3-4 48f 10 parts or 25-49%  FIM - Upper Body Dressing/Undressing Upper body dressing/undressing steps patient completed: Thread/unthread left sleeve of pullover shirt/dress Upper body dressing/undressing: 2: Max-Patient completed 25-49% of tasks FIM - Lower Body Dressing/Undressing Lower body dressing/undressing steps patient completed:  (socks only no other public clothing) Lower body dressing/undressing: 1: Two helpers  FIM - Hotel manager Devices: Grab bar or rail for support Toileting: 1: Two helpers  FIM - Diplomatic Services operational officer Devices: Therapist, music Transfers: 3-From toilet/BSC: Mod A (lift or lower assist), 3-To  toilet/BSC: Mod A (lift or lower assist)  FIM - Banker Devices: Arm rests Bed/Chair Transfer: 3: Supine > Sit: Mod A (lifting assist/Pt. 50-74%/lift 2 legs, 3: Bed > Chair or W/C: Mod A (lift or lower assist)  FIM - Locomotion: Wheelchair Distance: 60 Locomotion: Wheelchair: 1: Total Assistance/staff pushes wheelchair (Pt<25%) FIM - Locomotion: Ambulation Locomotion: Ambulation Assistive Devices: Other (comment) (three muskateer) Ambulation/Gait Assistance: 1: +2 Total assist Locomotion: Ambulation: 0: Activity did not occur  Comprehension Comprehension Mode: Auditory Comprehension: 2-Understands basic 25 - 49% of the time/requires cueing 51 - 75% of the time  Expression Expression Mode: Verbal Expression: 2-Expresses basic 25 - 49% of the time/requires cueing 50 - 75% of the time. Uses single words/gestures.  Social Interaction Social Interaction Mode: Asleep Social Interaction: 2-Interacts appropriately 25 - 49% of time - Needs frequent redirection.  Problem Solving Problem Solving Mode: Asleep Problem Solving: 2-Solves basic 25 - 49% of the time - needs direction more than half the time to initiate, plan or complete simple activities  Memory Memory Mode: Asleep Memory: 1-Recognizes or recalls less than 25% of the time/requires cueing greater than 75% of the time   Medical Problem List and Plan: 1. 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 DVT Prophylaxis/Anticoagulation: Subcutaneous heparin for DVT prophylaxis initiated 07/06/2014 3. Pain Management: Tylenol as needed 4. Dysphagia. Dysphagia 3 thin liquids. Monitor for any signs of aspiration. Follow-up speech therapy.  5. Neuropsych: This patient is not capable of making decisions on his own behalf. 6. Skin/Wound Care: Routine skin checks 7. Fluids/Electrolytes/Nutrition:  Intake inconsistent. Check labs again on  Monday.   -  no IVF due to cognition 8. Hypertension. Lisinopril 20 mg daily, norvasc 10mg . Reduce given hypotension. 9. Tobacco abuse, counseling when appropriate 10. Sleep-wake: ritalin and remeron for wake/sleep  -sleep chart  -arousal has steadily improved over the last 48+ hours.  LOS (Days) 16 A FACE TO FACE EVALUATION WAS PERFORMED  Terissa Haffey T 07/23/2014, 9:39 AM

## 2014-07-24 ENCOUNTER — Inpatient Hospital Stay (HOSPITAL_COMMUNITY): Payer: Medicare Other | Admitting: Physical Therapy

## 2014-07-24 ENCOUNTER — Inpatient Hospital Stay (HOSPITAL_COMMUNITY): Payer: Medicare Other | Admitting: Speech Pathology

## 2014-07-24 ENCOUNTER — Inpatient Hospital Stay (HOSPITAL_COMMUNITY): Payer: Medicare Other | Admitting: Occupational Therapy

## 2014-07-24 LAB — CBC
HCT: 36.7 % — ABNORMAL LOW (ref 39.0–52.0)
Hemoglobin: 12 g/dL — ABNORMAL LOW (ref 13.0–17.0)
MCH: 29.3 pg (ref 26.0–34.0)
MCHC: 32.7 g/dL (ref 30.0–36.0)
MCV: 89.5 fL (ref 78.0–100.0)
PLATELETS: 261 10*3/uL (ref 150–400)
RBC: 4.1 MIL/uL — ABNORMAL LOW (ref 4.22–5.81)
RDW: 13.2 % (ref 11.5–15.5)
WBC: 4.9 10*3/uL (ref 4.0–10.5)

## 2014-07-24 LAB — BASIC METABOLIC PANEL
Anion gap: 6 (ref 5–15)
BUN: 17 mg/dL (ref 6–20)
CALCIUM: 9.8 mg/dL (ref 8.9–10.3)
CO2: 31 mmol/L (ref 22–32)
CREATININE: 1.33 mg/dL — AB (ref 0.61–1.24)
Chloride: 102 mmol/L (ref 101–111)
GFR calc Af Amer: >60 mL/min (ref >60)
GFR calc non Af Amer: 53 mL/min — ABNORMAL LOW (ref >60)
Glucose, Bld: 105 mg/dL — ABNORMAL HIGH (ref 65–99)
Potassium: 4.4 mmol/L (ref 3.5–5.1)
SODIUM: 139 mmol/L (ref 135–145)

## 2014-07-24 NOTE — Progress Notes (Signed)
Physical Therapy Session Note  Patient Details  Name: Michael Goodwin MRN: [id number] Date of Birth: Nov 05, 1945  Today's Date: 07/24/2014 PT Individual Time: 1400-1445 PT Individual Time Calculation (min): 45 min   Short Term Goals: Week 2:  PT Short Term Goal 1 (Week 2): Pt will perform bed mobility with mod A with 75% cues to attend to RUE/LE PT Short Term Goal 2 (Week 2): Pt will perform functional transfers R and L at mod A level with 75% cues for technique PT Short Term Goal 3 (Week 2): Pt will perform sit<>stand with mod A with 75% cues to attend to L environment PT Short Term Goal 4 (Week 2): Pt will perform dynamic standing tasks at max A level for 3 mins to increase independence with ADLs.  PT Short Term Goal 5 (Week 2): Pt will ambulate x 10' with LRAD and +2A for safety with pt assist approx 30%.   Skilled Therapeutic Interventions/Progress Updates:   Pt received in w/c after OT session.  Pt fatigued but willing to participate.  Performed NMR in hallway with use of wall and wall rail + mirror as visual feedback for vertical and trunk position; see below.  Pt left tilted in w/c at RN station with quick release belt in place.    Therapy Documentation Precautions:  Precautions Precautions: Fall Precaution Comments: decreased attention, pusher to the right side, decreased trunk control and posture Restrictions Weight Bearing Restrictions: No Vital Signs: Therapy Vitals Temp: 98.5 F (36.9 C) Temp Source: Oral Pulse Rate: 80 Resp: 18 BP: 125/67 mmHg Patient Position (if appropriate): Sitting Oxygen Therapy SpO2: 98 % O2 Device: Not Delivered Pain: Pain Assessment Pain Assessment: No/denies pain Locomotion : Ambulation Ambulation/Gait Assistance: 1: +2 Total assist  Other Treatments: Treatments Neuromuscular Facilitation: Right;Left;Lower Extremity;Forced use;Activity to increase coordination;Activity to increase motor control;Activity to increase timing and  sequencing;Activity to increase grading;Activity to increase sustained activation;Activity to increase lateral weight shifting;Activity to increase anterior-posterior weight shifting during gait x 25' with UE support on wall rail on L and use of mirror as visual vertical and positioning of trunk (but pt continues to display poor attention and awareness even with use of visual feedback) x 25' with +2 for safety and max-total A of one person for facilitation of L lateral weight shifting, trunk position and RLE advancement; pt demonstrated sufficient activation in RLE during stance to maintain stabilization.  Continued NMR during sit > stand with use of LUE sliding up door frame and mod-max A of one therapist to facilitate continuous L lateral and forwards weight shift.  Pt continued to push to R off of wall.  Transitioned to seated lateral and anterior weight shifts with pt reaching over therapist's shoulder to back of chair forwards and to the L with therapist supporting RUE on shoulder for improved trunk positioning while pt performed 3 sit>squat and maintaining during attempted weight shifts to L for increased activation of RLE.  Pt consistently attempted to come to full stand and unable to maintain trunk elongation or head erect causing pt to collapse on therapist in chair.  Pt returned to sitting secondary to fatigue and positioned in tilt to rest.    See FIM for current functional status  Therapy/Group: Individual Therapy  Edman Circle [facility] 07/24/2014, 4:27 PM

## 2014-07-24 NOTE — Progress Notes (Signed)
Speech Language Pathology Weekly Progress and Session Note  Patient Details  Name: Michael Goodwin MRN: [id number] Date of Birth: 1945/04/20  Beginning of progress report period: July 17, 2014 End of progress report period: July 24, 2014  Today's Date: 07/24/2014 SLP Individual Time: 0804-0900 SLP Individual Time Calculation (min): 56 min  Short Term Goals: Week 2: SLP Short Term Goal 1 (Week 2): Pt will tolerate dysphagia 1 with nectar thick liquids using swallow strategies with max A  verbal/visual/tactile cueing. SLP Short Term Goal 1 - Progress (Week 2): Met SLP Short Term Goal 2 (Week 2): Pt will focus attention to functional tasks for 30 seconds with max A  multimodal cueing. SLP Short Term Goal 2 - Progress (Week 2): Met SLP Short Term Goal 3 (Week 2): Pt will be oriented x4 with visual external aids with max A multimodal cueing. SLP Short Term Goal 3 - Progress (Week 2): Met SLP Short Term Goal 4 (Week 2): Pt will recall biographical information with max A multimodal cueing. SLP Short Term Goal 4 - Progress (Week 2): Met SLP Short Term Goal 5 (Week 2): Pt will demonstrate basic problem solving in functional tasks with max A  multimodal cueing. SLP Short Term Goal 5 - Progress (Week 2): Met    New Short Term Goals: Week 3: SLP Short Term Goal 1 (Week 3): Pt will consume dys 2 trials to continue working towards diet progression with max assist multimodal cues for use of swallowing precautions.  SLP Short Term Goal 2 (Week 3): Pt will focus attention to functional tasks for 1 minute with max A  multimodal cueing. SLP Short Term Goal 3 (Week 3): Pt will be oriented x4 with visual external aids with mod A multimodal cueing. SLP Short Term Goal 4 (Week 3): Pt will recall biographical information with mod A multimodal cueing. SLP Short Term Goal 5 (Week 3): Pt will demonstrate basic problem solving in functional tasks with max A  verbal cueing.  Weekly Progress Updates:  Pt made small,  functional gains this reporting period and has met 5 out of 5 short term goals.  He is currently max assist for basic, familiar cognitive tasks due to right inattention, decreased focused/sustained attention to tasks, decreased recall of daily information, fluctuating orientation, and decreased functional problem solving.  He is consuming dys 1 textures and nectar thick liquids with max assist for use of swallowing precautions.  Pt has demonstrated improvements in alertness this reporting period.  Pt would continue to benefit from skilled ST while inpatient in order to maximize functional independence and reduce burden of care prior to discharge.  Pt and family education is ongoing.  Continue to recommend 24/7 supervision and ST follow up at next level of care.     Intensity: Minumum of 1-2 x/day, 30 to 90 minutes Frequency: 3 to 5 out of 7 days Duration/Length of Stay: 18-21 days Treatment/Interventions: Cognitive remediation/compensation;Cueing hierarchy;Dysphagia/aspiration precaution training;Functional tasks;Environmental controls;Internal/external aids;Medication managment;Multimodal communication approach;Patient/family education;Speech/Language facilitation;Oral motor exercises   Daily Session  Skilled Therapeutic Interventions: Pt was seen for skilled ST targeting goals for cognition and dysphagia.  Upon arrival, pt was laying in bed, awake, distractible but agreeable to participate in [place].  Pt was oriented to place and situation with max assist verbal cues.  Pt was transferred to wheelchair to maximize attention and alertness during structured tasks.  Once seated upright in wheelchair, pt consumed presentations of his currently prescribed diet with overall mod-max assist verbal, visual, and tactile cues for  use of compensatory swallowing strategies.  Pt was noted with improved automaticity for self feeding; however, he continued to require mod verbal and tactile cues to initiate and sequence self  feeding tasks with his non-dominant hand.  Pt was also noted with improved eye contact with SLP when seated on the right.  Oral care was completed after meal to remove trace residuals of purees after meal.  Pt left upright in wheelchair at nursing station with quick release belt donned for safety.  Goals updated on this date to reflect current progress and plan of care.         FIM:  Comprehension Comprehension Mode: Auditory Comprehension: 2-Understands basic 25 - 49% of the time/requires cueing 51 - 75% of the time Expression Expression Mode: Verbal Expression: 2-Expresses basic 25 - 49% of the time/requires cueing 50 - 75% of the time. Uses single words/gestures. Social Interaction Social Interaction: 2-Interacts appropriately 25 - 49% of time - Needs frequent redirection. Problem Solving Problem Solving: 2-Solves basic 25 - 49% of the time - needs direction more than half the time to initiate, plan or complete simple activities Memory Memory: 1-Recognizes or recalls less than 25% of the time/requires cueing greater than 75% of the time FIM - Eating Eating Activity: 4: Helper checks for pocketed food;4: Help with managing cup/glass;5: Needs verbal cues/supervision;4: Help with picking up utensils General    Pain Pain Assessment Pain Assessment: No/denies pain  Therapy/Group: Individual Therapy  Thedford Bunton, Selinda Orion 07/24/2014, 4:12 PM

## 2014-07-24 NOTE — Progress Notes (Signed)
Speech Language Pathology Daily Session Note  Patient Details  Name: Michael Goodwin MRN: [id number] Date of Birth: January 23, 1946  Today's Date: 07/24/2014 SLP Individual Time: 1130-1200 SLP Individual Time Calculation (min): 30 min  Short Term Goals: Week 2: SLP Short Term Goal 1 (Week 2): Pt will tolerate dysphagia 1 with nectar thick liquids using swallow strategies with max A  verbal/visual/tactile cueing. SLP Short Term Goal 1 - Progress (Week 2): Progressing toward goal SLP Short Term Goal 2 (Week 2): Pt will focus attention to functional tasks for 30 seconds with max A  multimodal cueing. SLP Short Term Goal 2 - Progress (Week 2): Progressing toward goal SLP Short Term Goal 3 (Week 2): Pt will be oriented x4 with visual external aids with max A multimodal cueing. SLP Short Term Goal 3 - Progress (Week 2): Progressing toward goal SLP Short Term Goal 4 (Week 2): Pt will recall biographical information with max A multimodal cueing. SLP Short Term Goal 4 - Progress (Week 2): Progressing toward goal SLP Short Term Goal 5 (Week 2): Pt will demonstrate basic problem solving in functional tasks with max A  multimodal cueing. SLP Short Term Goal 5 - Progress (Week 2): Progressing toward goal  Skilled Therapeutic Interventions: Skilled treatment session focused on cognitive goals. SLP facilitated session by providing Mod A question cues for orientation to time and for recall of basic biographical information. Patient also required Mod-Max A multimodal cues for functional problem solving during a basic calendar making task and Mod A for initiation and sustained attention to task for ~60 seconds.  Patient reported he felt dizzy towards end of session, RN made aware. Patient left upright in wheelchair with quick release belt on and family present. Continue with current plan of care.    FIM:  Comprehension Comprehension Mode: Auditory Comprehension: 2-Understands basic 25 - 49% of the time/requires  cueing 51 - 75% of the time Expression Expression Mode: Verbal Expression: 2-Expresses basic 25 - 49% of the time/requires cueing 50 - 75% of the time. Uses single words/gestures. Social Interaction Social Interaction: 2-Interacts appropriately 25 - 49% of time - Needs frequent redirection. Problem Solving Problem Solving: 2-Solves basic 25 - 49% of the time - needs direction more than half the time to initiate, plan or complete simple activities Memory Memory: 1-Recognizes or recalls less than 25% of the time/requires cueing greater than 75% of the time FIM - Eating Eating Activity: 4: Helper checks for pocketed food  Pain No/Denies Pain  Therapy/Group: Individual Therapy  [name] 07/24/2014, 3:52 PM

## 2014-07-24 NOTE — Progress Notes (Signed)
Subjective/Complaints: Pt awakens easily to voice  No pain c/os Not oriented to place time or situyation but more alert  Review of Systems - no sob, cough, n/v/d/pain.     Objective: Vital Signs: Blood pressure 123/81, pulse 70, temperature 98.5 F (36.9 C), temperature source Oral, resp. rate 18, height 6' (1.829 m), weight 70.1 kg (154 lb 8.7 oz), SpO2 100 %. No results found. No results found for this or any previous visit (from the past 72 hour(s)).   HEENT: normal Cardio: RRR and no murmur Resp: CTA B/L and unlabored GI: BS positive and NT, ND Extremity:  Pulses positive and No Edema Skin:   Intact Neuro: more alert. Better eye contact and attention. knew he was in hospital only when given choices. ".  2- to 2/5 Right knee ext,2- right hip flexion  trace R biceps flexion, 4- in LUE and LLE  Musc/Skel:  No pain with PROM of limbs today Gen NAD.     Assessment/Plan: 1. 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     FIM: FIM - Bathing Bathing Steps Patient Completed: Chest, Left Arm, Abdomen, Front perineal area Bathing: 2: Max-Patient completes 3-4 64f 10 parts or 25-49%  FIM - Upper Body Dressing/Undressing Upper body dressing/undressing steps patient completed: Thread/unthread left sleeve of pullover shirt/dress Upper body dressing/undressing: 2: Max-Patient completed 25-49% of tasks FIM - Lower Body Dressing/Undressing Lower body dressing/undressing steps patient completed:  (socks only no other public clothing) Lower body dressing/undressing: 1: Two helpers  FIM - Hotel manager Devices: Grab bar or rail for support Toileting: 1: Two helpers  FIM - Diplomatic Services operational officer Devices: Therapist, music Transfers: 3-From  toilet/BSC: Mod A (lift or lower assist), 3-To toilet/BSC: Mod A (lift or lower assist)  FIM - Banker Devices: Arm rests Bed/Chair Transfer: 3: Supine > Sit: Mod A (lifting assist/Pt. 50-74%/lift 2 legs, 3: Bed > Chair or W/C: Mod A (lift or lower assist), 3: Chair or W/C > Bed: Mod A (lift or lower assist)  FIM - Locomotion: Wheelchair Distance: 60 Locomotion: Wheelchair: 1: Total Assistance/staff pushes wheelchair (Pt<25%) FIM - Locomotion: Ambulation Locomotion: Ambulation Assistive Devices: Other (comment) (three muskateer) Ambulation/Gait Assistance: 1: +2 Total assist Locomotion: Ambulation: 0: Activity did not occur  Comprehension Comprehension Mode: Auditory Comprehension: 2-Understands basic 25 - 49% of the time/requires cueing 51 - 75% of the time  Expression Expression Mode: Verbal Expression: 2-Expresses basic 25 - 49% of the time/requires cueing 50 - 75% of the time. Uses single words/gestures.  Social Interaction Social Interaction Mode: Asleep Social Interaction: 2-Interacts appropriately 25 - 49% of time - Needs frequent redirection.  Problem Solving Problem Solving Mode: Asleep Problem Solving: 2-Solves basic 25 - 49% of the time - needs direction more than half the time to initiate, plan or complete simple activities  Memory Memory Mode: Asleep Memory: 1-Recognizes or recalls less than 25% of the time/requires cueing greater than 75% of the time   Medical Problem List and Plan: 1. 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 DVT Prophylaxis/Anticoagulation: Subcutaneous heparin for DVT prophylaxis initiated 07/06/2014 3. Pain Management: Tylenol as needed 4. Dysphagia. Dysphagia 3 thin liquids. Monitor for any signs of aspiration. Follow-up speech therapy.  5. Neuropsych: This patient is not  capable of making decisions on his own behalf. 6. Skin/Wound  Care: Routine skin checks 7. Fluids/Electrolytes/Nutrition:  Intake inconsistent. Check labs again today.   -fluid intake ok, caloric varies 20-100% 8. Hypertension. Lisinopril 20 mg daily, norvasc 10mg . Reduce given hypotension. 9. Tobacco abuse, counseling when appropriate 10. Sleep-wake: ritalin and remeron for wake/sleep  -sleep chart  -arousal has steadily improved over the last 72+ hours.  LOS (Days) 17 A FACE TO FACE EVALUATION WAS PERFORMED  KIRSTEINS,ANDREW E 07/24/2014, 7:53 AM

## 2014-07-24 NOTE — Progress Notes (Signed)
Occupational Therapy Session Note  Patient Details  Name: Michael Goodwin MRN: [id number] Date of Birth: March 28, 1945  Today's Date: 07/24/2014 OT Individual Time: 1305-1400 OT Individual Time Calculation (min): 55 min    Skilled Therapeutic Interventions/Progress Updates:    Pt worked on dynamic trunk control, sit to stand, standing balance, left visual attention and RUE stabilization during session.  In sitting EOM pt is able to maintain balance with close supervision statically.  Worked on having him reach across midline with the LUE to retrieve and place clothespins.  He was asked to reach for a certain color, with accuracy around 50%.  Pt with increased difficulty locating the green clothespins but was able to more consistently identify and reach for the blue and the red.  While reaching had pt's RUE positioned on the mat for weightbearing.  Noted increased flexor tone in the right biceps and internal rotators during task.  Progressed to having pt work in standing and reaching forward and laterally to the left side.  Pt still with increased pushing to the right side in standing.  Total assist for standing secondary to pushing with max facilitation needed at the left knee and hip.  Pt with increased flexor tone also noted in the left hamstrings.  Pt performed 2-3 intervals of standing for durations of 3-5 mins.    Therapy Documentation Precautions:  Precautions Precautions: Fall Precaution Comments: decreased attention, pusher to the right side, decreased trunk control and posture Restrictions Weight Bearing Restrictions: No  Pain: Pain Assessment Pain Assessment: No/denies pain ADL: See FIM for current functional status  Therapy/Group: Individual Therapy  Roanna Reaves OTR/L 07/24/2014, 4:19 PM

## 2014-07-25 ENCOUNTER — Inpatient Hospital Stay (HOSPITAL_COMMUNITY): Payer: Medicare Other | Admitting: Occupational Therapy

## 2014-07-25 ENCOUNTER — Inpatient Hospital Stay (HOSPITAL_COMMUNITY): Payer: Medicare Other | Admitting: Rehabilitation

## 2014-07-25 ENCOUNTER — Inpatient Hospital Stay (HOSPITAL_COMMUNITY): Payer: Medicare Other | Admitting: Speech Pathology

## 2014-07-25 MED ORDER — LISINOPRIL 10 MG PO TABS
10.0000 mg | ORAL_TABLET | Freq: Every day | ORAL | Status: DC
  Administered 2014-07-25 – 2014-08-01 (×8): 10 mg via ORAL
  Filled 2014-07-25 (×12): qty 1

## 2014-07-25 NOTE — Progress Notes (Signed)
Occupational Therapy Session Note  Patient Details  Name: Michael Goodwin MRN: [id number] Date of Birth: [birth year]  Today's Date: 07/25/2014 OT Individual Time: 1445-1515 OT Individual Time Calculation (min): 30 min     Short Term Goals: Week 3:  OT Short Term Goal 1 (Week 3): STGs= LTGs secondary to planned upcoming discharge  Skilled Therapeutic Interventions/Progress Updates:    Pt seen for OT therapy focusing on cognitive retraining. Pt sitting on EOB with rehab tech present. Pt sat EOB to sort playing cards by color. Pt required max VCs to sort ~10 cards, however unable to manage extraneous visual input of card number and suit to focus on color in order to sort. Pt then required to answer questions regarding color of objects in room. Pt able to correctly identify colors on L side, however unable to fully attend to R side to answer color questions, unable to state color of therapist's shirt sitting beside him on R. Pt then answered orientation questions, unable to state type of place or state currently in. He was able to state birth date but unable to identify month or year orientation questions when given 3 options. Pt then sorted days of the week written on paper. Pt able to verbally recall 7 days of the week with min VCs, however required max A to sequence days of the week written out on paper. Pt tolerated ~30 minutes of unsupported sitting on EOB with min VCs for upright posture. Pt returned to supine at end of session with mod A. Pt left in supine with bed alarm on and education provided regarding use of call ball.   Therapy Documentation Precautions:  Precautions Precautions: Fall Precaution Comments: decreased attention, pusher to the right side, decreased trunk control and posture Restrictions Weight Bearing Restrictions: No Pain: Pain Assessment Pain Assessment: No/denies pain  See FIM for current functional status  Therapy/Group: Individual Therapy  Lewis, Denisha Hoel C 07/25/2014,  7:20 AM

## 2014-07-25 NOTE — Progress Notes (Signed)
Occupational Therapy Session Note  Patient Details  Name: Michael Goodwin MRN: [id number] Date of Birth: 1945/07/07  Today's Date: 07/25/2014 OT Individual Time: 1104-1200 OT Individual Time Calculation (min): 56 min    Short Term Goals: Week 3:  OT Short Term Goal 1 (Week 3): STGs= LTGs secondary to planned upcoming discharge  Skilled Therapeutic Interventions/Progress Updates:    Pt performed bathing and dressing sit to stand on the EOB.  He was able to sequence through bathing with max step by step cueing for each task.  He maintain sustained attention to task as well with cueing.  He was also able to maintain static sitting balance with supervision as well as dynamic when washing his UB.  However, when crossing RLE over the left knee for washing the right foot and for dressing he demonstrated LOB posteriorly.  Max hand over hand assistance needed for integrating the LUE into the session.  Increased flexor tone noted in the biceps and digits but pt frequently tries to integrate it into session.  Max instructional cueing for him to visually locate the RUE when attempting to use it.  Sit to stand and standing balance when pulling pants over hips and washing peri area is at a max assist level with pt needing max facilitation to achieve right knee and hip extension.  He tends to maintain flexed trunk and head.  Pt was able to assist with all aspects of dressing but overall needs max assist for LB and mod to max assist for UB.  Pt positioned in wheelchair at nurses station at end of session with safety belt in place.    Therapy Documentation Precautions:  Precautions Precautions: Fall Precaution Comments: decreased attention, pusher to the right side, decreased trunk control and posture Restrictions Weight Bearing Restrictions: No  Pain: Pain Assessment Pain Assessment: No/denies pain ADL: See FIM for current functional status  Therapy/Group: Individual Therapy  December Hedtke  OTR/L 07/25/2014, 12:39 PM

## 2014-07-25 NOTE — Progress Notes (Signed)
Speech Language Pathology Daily Session Note  Patient Details  Name: Michael Goodwin MRN: [id number] Date of Birth: Oct 06, 1945  Today's Date: 07/25/2014 SLP Individual Time: 0950-1050 SLP Individual Time Calculation (min): 60 min  Short Term Goals: Week 3: SLP Short Term Goal 1 (Week 3): Pt will consume dys 2 trials to continue working towards diet progression with max assist multimodal cues for use of swallowing precautions.  SLP Short Term Goal 2 (Week 3): Pt will focus attention to functional tasks for 1 minute with max A  multimodal cueing. SLP Short Term Goal 3 (Week 3): Pt will be oriented x4 with visual external aids with mod A multimodal cueing. SLP Short Term Goal 4 (Week 3): Pt will recall biographical information with mod A multimodal cueing. SLP Short Term Goal 5 (Week 3): Pt will demonstrate basic problem solving in functional tasks with max A  verbal cueing.  Skilled Therapeutic Interventions:  Pt was seen for skilled ST targeting cognitive goals.  Upon arrival, pt was finishing breakfast with full supervision from RN.  Upon completion of meal, SLP facilitated the session with mod assist verbal and tactile cues for initiation and sequencing of oral care with suction toothette.  Pt was transported to a minimally distracting environment to maximize attention and alertness during cognitive tasks.  Pt was able to verbalize use of problem solving strategies with max assist during a basic familiar board game; however, due to decreased initiation and sequencing, pt required hand over hand assist to execute problem solving strategy.  Pt sustained his attention to task for 30 second intervals with max assist verbal and visual cues for redirection.  Pt also required overall min-mod verbal, visual, and tactile cues to maintain an upright seated position versus pushing himself to the left.  Pt asked this therapist why he "always leaned to the left."  SLP reviewed and reinforced that pt's deficits were  related to his stroke and that he was in the hospital for rehabilitation from said stroke.  Upon return to room, pt's sister and nephew were present. SLP provided skilled education regarding discharge recommendations for 24/7 supervision, assistance for medication and financial management, and ST follow up at next level of care.  Pt's family verbalized understanding and will relay recommendations to pt's daughter.  Family education with pt's daughter scheduled tomorrow.   Pt was left with family in room, upright in wheelchair with quick release belt donned.   Continue per current plan of care.   FIM:  Comprehension Comprehension Mode: Auditory Comprehension: 2-Understands basic 25 - 49% of the time/requires cueing 51 - 75% of the time Expression Expression Mode: Verbal Expression: 2-Expresses basic 25 - 49% of the time/requires cueing 50 - 75% of the time. Uses single words/gestures. Social Interaction Social Interaction: 2-Interacts appropriately 25 - 49% of time - Needs frequent redirection. Problem Solving Problem Solving: 2-Solves basic 25 - 49% of the time - needs direction more than half the time to initiate, plan or complete simple activities Memory Memory: 2-Recognizes or recalls 25 - 49% of the time/requires cueing 51 - 75% of the time FIM - Eating Eating Activity: 4: Helper checks for pocketed food  Pain Pain Assessment Pain Assessment: No/denies pain  Therapy/Group: Individual Therapy  Marton Malizia, Melanee Spry 07/25/2014, 5:12 PM

## 2014-07-25 NOTE — Progress Notes (Signed)
Nutrition Follow-up  DOCUMENTATION CODES:  Non-severe (moderate) malnutrition in context of chronic illness  INTERVENTION:  Ensure Enlive (each supplement provides 350kcal and 20 grams of protein) (BID) thickened to nectar thick.  Encourage adequate PO intake.   NUTRITION DIAGNOSIS:  Increased nutrient needs related to  (therapy) as evidenced by estimated needs; ongoing  GOAL:  Patient will meet greater than or equal to 90% of their needs; progressing  MONITOR:  PO intake, Supplement acceptance, Weight trends, Labs, I & O's, Diet advancement  REASON FOR ASSESSMENT:  Malnutrition Screening Tool    ASSESSMENT: Pt with history of tobacco abuse, hypertension. Presented 07/04/2014 with right-sided weakness and altered mental status/ code stroke. CT the head showed left ICH with intraventricular extension.  Pt is currently on a dysphagia 1 diet with nectar thick liquids. Meal completion has been varied from 20-100%, however most meals have been >/= 50%. Pt has been consuming his Ensure shakes. Will continue with orders. Continue to encourage adequate PO intake.   Labs and medications reviewed.  Height:  Ht Readings from Last 1 Encounters:  07/13/14 6' (1.829 m)    Weight:  Wt Readings from Last 1 Encounters:  07/19/14 154 lb 8.7 oz (70.1 kg)    Ideal Body Weight:  81 kg  Wt Readings from Last 10 Encounters:  07/19/14 154 lb 8.7 oz (70.1 kg)  07/04/14 142 lb 12.8 oz (64.774 kg)    BMI:  Body mass index is 20.96 kg/(m^2).  Estimated Nutritional Needs:  Kcal:  1850-2100  Protein:  90-100 grams  Fluid:  1.9 - 2.1 L/day  Skin:  Reviewed, no issues  Diet Order:  DIET - DYS 1 Room service appropriate?: Yes; Fluid consistency:: Nectar Thick  EDUCATION NEEDS:  No education needs identified at this time   Intake/Output Summary (Last 24 hours) at 07/25/14 1547 Last data filed at 07/25/14 1500  Gross per 24 hour  Intake    480 ml  Output    200 ml  Net     280 ml    Last BM:  6/11  Roslyn Smiling, MS, RD, LDN Pager # [phone] After hours/ weekend pager # [phone]

## 2014-07-25 NOTE — Progress Notes (Signed)
Subjective/Complaints: Awakens to physical stim but not voice ?sleep last noc  Review of Systems - cannot obtain secondary to mental status    Objective: Vital Signs: Blood pressure 106/71, pulse 65, temperature 97.8 F (36.6 C), temperature source Oral, resp. rate 17, height 6' (1.829 m), weight 70.1 kg (154 lb 8.7 oz), SpO2 100 %. No results found. Results for orders placed or performed during the hospital encounter of 07/07/14 (from the past 72 hour(s))  Basic metabolic panel     Status: Abnormal   Collection Time: 07/24/14  7:30 AM  Result Value Ref Range   Sodium 139 135 - 145 mmol/L   Potassium 4.4 3.5 - 5.1 mmol/L   Chloride 102 101 - 111 mmol/L   CO2 31 22 - 32 mmol/L   Glucose, Bld 105 (H) 65 - 99 mg/dL   BUN 17 6 - 20 mg/dL   Creatinine, Ser 1.33 (H) 0.61 - 1.24 mg/dL   Calcium 9.8 8.9 - 10.3 mg/dL   GFR calc non Af Amer 53 (L) >60 mL/min   GFR calc Af Amer >60 >60 mL/min    Comment: (NOTE) The eGFR has been calculated using the CKD EPI equation. This calculation has not been validated in all clinical situations. eGFR's persistently <60 mL/min signify possible Chronic Kidney Disease.    Anion gap 6 5 - 15  CBC     Status: Abnormal   Collection Time: 07/24/14  7:30 AM  Result Value Ref Range   WBC 4.9 4.0 - 10.5 K/uL   RBC 4.10 (L) 4.22 - 5.81 MIL/uL   Hemoglobin 12.0 (L) 13.0 - 17.0 g/dL   HCT 36.7 (L) 39.0 - 52.0 %   MCV 89.5 78.0 - 100.0 fL   MCH 29.3 26.0 - 34.0 pg   MCHC 32.7 30.0 - 36.0 g/dL   RDW 13.2 11.5 - 15.5 %   Platelets 261 150 - 400 K/uL     HEENT: normal Cardio: RRR and no murmur Resp: CTA B/L and unlabored GI: BS positive and NT, ND Extremity:  Pulses positive and No Edema Skin:   Intact Neuro: more alert. Better eye contact and attention. knew he was in hospital only when given choices. ".  2- to 2/5 Right knee ext,2- right hip flexion  trace R biceps flexion, 4- in LUE and LLE  Musc/Skel:  No pain with PROM of limbs today Gen  NAD.     Assessment/Plan: 1. 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     FIM: FIM - Bathing Bathing Steps Patient Completed: Chest, Left Arm, Abdomen, Front perineal area Bathing: 2: Max-Patient completes 3-4 28f10 parts or 25-49%  FIM - Upper Body Dressing/Undressing Upper body dressing/undressing steps patient completed: Thread/unthread left sleeve of pullover shirt/dress Upper body dressing/undressing: 2: Max-Patient completed 25-49% of tasks FIM - Lower Body Dressing/Undressing Lower body dressing/undressing steps patient completed:  (socks only no other public clothing) Lower body dressing/undressing: 1: Two helpers  FIM - TMusicianDevices: Grab bar or rail for support Toileting: 1: Two helpers  FIM - TRadio producerDevices: GProduct managerTransfers: 3-From toilet/BSC: Mod A (lift or lower assist), 3-To toilet/BSC: Mod A (lift or lower assist)  FIM - BControl and instrumentation engineerDevices: Arm rests Bed/Chair Transfer: 3: Supine > Sit: Mod  A (lifting assist/Pt. 50-74%/lift 2 legs, 3: Bed > Chair or W/C: Mod A (lift or lower assist), 3: Chair or W/C > Bed: Mod A (lift or lower assist)  FIM - Locomotion: Wheelchair Distance: 60 Locomotion: Wheelchair: 1: Total Assistance/staff pushes wheelchair (Pt<25%) FIM - Locomotion: Ambulation Locomotion: Ambulation Assistive Devices: Other (comment) (wall rail on L) Ambulation/Gait Assistance: 1: +2 Total assist Locomotion: Ambulation: 1: Two helpers  Comprehension Comprehension Mode: Auditory Comprehension: 2-Understands basic 25 - 49% of the time/requires cueing 51 - 75% of the time  Expression Expression Mode: Verbal Expression: 2-Expresses basic 25  - 49% of the time/requires cueing 50 - 75% of the time. Uses single words/gestures.  Social Interaction Social Interaction Mode: Asleep Social Interaction: 2-Interacts appropriately 25 - 49% of time - Needs frequent redirection.  Problem Solving Problem Solving Mode: Asleep Problem Solving: 2-Solves basic 25 - 49% of the time - needs direction more than half the time to initiate, plan or complete simple activities  Memory Memory Mode: Asleep Memory: 1-Recognizes or recalls less than 25% of the time/requires cueing greater than 75% of the time   Medical Problem List and Plan: 1. 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 DVT Prophylaxis/Anticoagulation: Subcutaneous heparin for DVT prophylaxis initiated 07/06/2014 3. Pain Management: Tylenol as needed 4. Dysphagia. Dysphagia 3 thin liquids. Monitor for any signs of aspiration. Follow-up speech therapy.  5. Neuropsych: This patient is not capable of making decisions on his own behalf. 6. Skin/Wound Care: Routine skin checks 7. Fluids/Electrolytes/Nutrition:  Intake inconsistent. Check labs again today.   -fluid intake ok, caloric varies 20-100% 8. Hypertension. Lisinopril 20 mg daily, norvasc 68m. Reduce given hypotension., elevated creat but BUN looks ok, will reduce lisinopril 9. Tobacco abuse, counseling when appropriate 10. Sleep-wake: ritalin and remeron for wake/sleep  -sleep chart  -arousal has steadily improved over the last 72+ hours.  LOS (Days) 18 A FACE TO FACE EVALUATION WAS PERFORMED  Michael Goodwin E 07/25/2014, 7:44 AM

## 2014-07-25 NOTE — Progress Notes (Signed)
Physical Therapy Weekly Progress Note  Patient Details  Name: Dixon Luczak MRN: [id number] Date of Birth: 1945-07-20  Beginning of progress report period: July 17, 2014 End of progress report period: July 25, 2014  Today's Date: 07/25/2014 PT Individual Time: 0800-0900 PT Individual Time Calculation (min): 60 min   Patient has met 1 of 5 short term goals.  Pt continues to make very slow progress with all aspects of mobility.  He has began to demonstrate increased alertness during therapy sessions, however this remains a barrier.  Also continues to be limited by severe cognitive deficits as well as pusher tendencies in sitting and standing.  Continue to feel that home will not be safe for pt at time of D/C.  Recommend SNF level of care at time of d/C.   Patient continues to demonstrate the following deficits: decreased balance, decreased safety, decreased awareness, decreased functional strength in RUE/LE, pusher tendencies and therefore will continue to benefit from skilled PT intervention to enhance overall performance with activity tolerance, balance, postural control, ability to compensate for deficits, functional use of  right upper extremity and right lower extremity, attention, awareness, coordination and knowledge of precautions.  Patient not progressing toward long term goals.  See goal revision..  Plan of care revisions: Downgraded to overall mod A level from w/c on 6/11.  PT Short Term Goals Week 2:  PT Short Term Goal 1 (Week 2): Pt will perform bed mobility with mod A with 75% cues to attend to RUE/LE PT Short Term Goal 1 - Progress (Week 2): Met PT Short Term Goal 2 (Week 2): Pt will perform functional transfers R and L at mod A level with 75% cues for technique PT Short Term Goal 2 - Progress (Week 2): Progressing toward goal PT Short Term Goal 3 (Week 2): Pt will perform sit<>stand with mod A with 75% cues to attend to L environment PT Short Term Goal 3 - Progress (Week 2):  Progressing toward goal PT Short Term Goal 4 (Week 2): Pt will perform dynamic standing tasks at max A level for 3 mins to increase independence with ADLs.  PT Short Term Goal 4 - Progress (Week 2): Progressing toward goal PT Short Term Goal 5 (Week 2): Pt will ambulate x 10' with LRAD and +2A for safety with pt assist approx 30%.  PT Short Term Goal 5 - Progress (Week 2): Progressing toward goal Week 3:  PT Short Term Goal 1 (Week 3): =LTG's due to ELOS (downgraded 6/11 due to lack of progress)  Skilled Therapeutic Interventions/Progress Updates:   Pt received lying in bed asleep, was aroused via continued touch and voice throughout.  Performed bed mobility with HOB flat and without rails to better simulate home environment.  Performed at max A level with continued cues to attend to RUE as well as RLE during rolling for best outcome.  Once at EOB, assisted with donning pants and shoes prior to mini stand to pull pants up.  Pt able to sit at EOB for approx 3 mins at S to mod A due to intermittent LOB to the L and R.  Assisted into w/c via squat pivot transfer at mod A level with continued cues for forward weight shift and trunk lean to increase WB through LEs.  Transferred to mat in therapy gym, where session focused on dynamic sitting balance (with feet unsupported to decrease pushing) with postural control, increased anterior pelvic tilt, thoracic extension, midline orientation, increased reaching to the L to decrease pusher  tendencies.  Also worked on sit<>mini stands followed by reaching to the L as above for increased R glute/quad activation (sustained).  Requires max cues for alertness and to maintain eyes open during session.  Pt with increased language of confusion during session and frustration with performing tasks asked of him.  Ended session with gait x 2 reps of 30' with +2A (three musketeer style) with PT under R axilla to provide support at trunk for upright posture, anterior pelvic tilt,  forward translation over RLE during stance and stabilization through R knee during stance.  Assisted back to w/c and donned quick release belt, and assisted to nursing station, RN aware.   Therapy Documentation Precautions:  Precautions Precautions: Fall Precaution Comments: decreased attention, pusher to the right side, decreased trunk control and posture Restrictions Weight Bearing Restrictions: No   Vital Signs: Therapy Vitals Temp: 97.8 F (36.6 C) Temp Source: Oral Pulse Rate: 65 Resp: 17 BP: 106/71 mmHg Patient Position (if appropriate): Lying Oxygen Therapy SpO2: 100 % O2 Device: Not Delivered Pain: Pt with no reports of pain during session.   See FIM for current functional status  Therapy/Group: Individual Therapy  Denice Bors 07/25/2014, 7:40 AM

## 2014-07-26 ENCOUNTER — Ambulatory Visit (HOSPITAL_COMMUNITY): Payer: Medicare Other | Admitting: Rehabilitation

## 2014-07-26 ENCOUNTER — Encounter (HOSPITAL_COMMUNITY): Payer: Medicare Other | Admitting: Occupational Therapy

## 2014-07-26 ENCOUNTER — Inpatient Hospital Stay (HOSPITAL_COMMUNITY): Payer: Medicare Other | Admitting: Rehabilitation

## 2014-07-26 ENCOUNTER — Encounter (HOSPITAL_COMMUNITY): Payer: Medicare Other | Admitting: Speech Pathology

## 2014-07-26 NOTE — Progress Notes (Signed)
Subjective/Complaints:  Awake and alert, eating breakfast with aide assist Per RN slept well yesterday, no napping durin the day while pt at nurses station  Review of Systems - cannot obtain full review secondary to mental status, pt denies pain or breathing issues   Objective: Vital Signs: Blood pressure 134/88, pulse 63, temperature 97.8 F (36.6 C), temperature source Oral, resp. rate 18, height 6' (1.829 m), weight 70.1 kg (154 lb 8.7 oz), SpO2 97 %. No results found. Results for orders placed or performed during the hospital encounter of 07/07/14 (from the past 72 hour(s))  Basic metabolic panel     Status: Abnormal   Collection Time: 07/24/14  7:30 AM  Result Value Ref Range   Sodium 139 135 - 145 mmol/L   Potassium 4.4 3.5 - 5.1 mmol/L   Chloride 102 101 - 111 mmol/L   CO2 31 22 - 32 mmol/L   Glucose, Bld 105 (H) 65 - 99 mg/dL   BUN 17 6 - 20 mg/dL   Creatinine, Ser 1.33 (H) 0.61 - 1.24 mg/dL   Calcium 9.8 8.9 - 10.3 mg/dL   GFR calc non Af Amer 53 (L) >60 mL/min   GFR calc Af Amer >60 >60 mL/min    Comment: (NOTE) The eGFR has been calculated using the CKD EPI equation. This calculation has not been validated in all clinical situations. eGFR's persistently <60 mL/min signify possible Chronic Kidney Disease.    Anion gap 6 5 - 15  CBC     Status: Abnormal   Collection Time: 07/24/14  7:30 AM  Result Value Ref Range   WBC 4.9 4.0 - 10.5 K/uL   RBC 4.10 (L) 4.22 - 5.81 MIL/uL   Hemoglobin 12.0 (L) 13.0 - 17.0 g/dL   HCT 36.7 (L) 39.0 - 52.0 %   MCV 89.5 78.0 - 100.0 fL   MCH 29.3 26.0 - 34.0 pg   MCHC 32.7 30.0 - 36.0 g/dL   RDW 13.2 11.5 - 15.5 %   Platelets 261 150 - 400 K/uL     HEENT: normal Cardio: RRR and no murmur Resp: CTA B/L and unlabored GI: BS positive and NT, ND Extremity:  Pulses positive and No Edema Skin:   Intact Neuro: more alert. Better eye contact and attention. knew he was in hospital only when given choices. ".  2- to 2/5 Right knee  ext,2- right hip flexion  2- right grip and  R biceps flexion, 4- in LUE and LLE  Musc/Skel:  No pain with PROM of limbs today Gen NAD.     Assessment/Plan: 1. 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     FIM: FIM - Bathing Bathing Steps Patient Completed: Chest, Abdomen, Front perineal area, Right Arm, Right upper leg, Left upper leg Bathing: 3: Mod-Patient completes 5-7 48f10 parts or 50-74%  FIM - Upper Body Dressing/Undressing Upper body dressing/undressing steps patient completed: Pull shirt over trunk, Put head through opening of pull over shirt/dress Upper body dressing/undressing: 3: Mod-Patient completed 50-74% of tasks FIM - Lower Body Dressing/Undressing Lower body dressing/undressing steps patient completed:  (socks only no other public clothing) Lower body dressing/undressing: 1: Two helpers  FIM - TMusicianDevices: Grab bar or rail for support Toileting: 1: Two helpers  FIM - TRadio producerDevices: GMuseum/gallery curator  bars Toilet Transfers: 3-From toilet/BSC: Mod A (lift or lower assist), 3-To toilet/BSC: Mod A (lift or lower assist)  FIM - Bed/Chair Transfer Bed/Chair Transfer Assistive Devices: Arm rests Bed/Chair Transfer: 3: Sit > Supine: Mod A (lifting assist/Pt. 50-74%/lift 2 legs)  FIM - Locomotion: Wheelchair Distance: 60 Locomotion: Wheelchair: 1: Total Assistance/staff pushes wheelchair (Pt<25%) FIM - Locomotion: Ambulation Locomotion: Ambulation Assistive Devices: Other (comment) ("three muskateer style") Ambulation/Gait Assistance: 1: +2 Total assist Locomotion: Ambulation: 0: Activity did not occur  Comprehension Comprehension Mode: Auditory Comprehension: 2-Understands basic 25 - 49% of the  time/requires cueing 51 - 75% of the time  Expression Expression Mode: Verbal Expression: 2-Expresses basic 25 - 49% of the time/requires cueing 50 - 75% of the time. Uses single words/gestures.  Social Interaction Social Interaction Mode: Asleep Social Interaction: 2-Interacts appropriately 25 - 49% of time - Needs frequent redirection.  Problem Solving Problem Solving Mode: Asleep Problem Solving: 2-Solves basic 25 - 49% of the time - needs direction more than half the time to initiate, plan or complete simple activities  Memory Memory Mode: Asleep Memory: 2-Recognizes or recalls 25 - 49% of the time/requires cueing 51 - 75% of the time   Medical Problem List and Plan: 1. 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 DVT Prophylaxis/Anticoagulation: Subcutaneous heparin for DVT prophylaxis initiated 07/06/2014 3. Pain Management: Tylenol as needed 4. Dysphagia. Dysphagia 3 thin liquids. Monitor for any signs of aspiration. Follow-up speech therapy.  5. Neuropsych: This patient is not capable of making decisions on his own behalf. 6. Skin/Wound Care: Routine skin checks 7. Fluids/Electrolytes/Nutrition:  Intake inconsistent. Check labs again today.   -fluid intake ok, caloric varies 20-100% 8. Hypertension. Lisinopril 20 mg daily, norvasc 71m. Reduce given hypotension., elevated creat but BUN looks ok, will reduce lisinopril 9. Tobacco abuse, counseling when appropriate 10. Sleep-wake: ritalin and remeron for wake/sleep  -sleep chart  -arousal has steadily improved over the last 72+ hours.  LOS (Days) [place]  KIRSTEINS,ANDREW E 07/26/2014, 8:38 AM

## 2014-07-26 NOTE — Plan of Care (Signed)
Problem: RH Swallowing Goal: LTG Patient will consume least restrictive PO diet (SLP) LTG: Patient will consume least restrictive PO diet with assist for use of compensatory strategies (SLP)  Downgraded due to slow progress  Problem: RH Cognition - SLP Goal: RH LTG Patient will demonstrate orientation with cues LTG: Patient will demonstrate orientation to person/place/time/situation with cues (SLP)  Downgraded due to slow progress  Problem: RH Expression Communication Goal: LTG Patient will express needs/wants via multi-modal(SLP) LTG: Patient will express needs/wants via multi-modal communication (gestures/written, etc) with cues (SLP)  Downgraded due to slow progress  Problem: RH Problem Solving Goal: LTG Patient will demonstrate problem solving for (SLP) LTG: Patient will demonstrate problem solving for basic/complex daily situations with cues (SLP)  Downgraded due to slow progress   Problem: RH Memory Goal: LTG Patient will use memory compensatory aids to (SLP) LTG: Patient will use memory compensatory aids to recall biographical/new, daily complex information with cues (SLP)  Downgraded due to slow progress  Problem: RH Attention Goal: LTG Patient will demonstrate focused/sustained (SLP) LTG: Patient will demonstrate focused/sustained/selective/alternating/divided attention during cognitive/linguistic activities in specific environment with assist for # of minutes (SLP)  Downgraded due to slow progress  Problem: RH Awareness Goal: LTG: Patient will demonstrate intellectual/emergent (SLP) LTG: Patient will demonstrate intellectual/emergent/anticipatory awareness with assist during a cognitive/linguistic activity (SLP)  Downgraded due to slow progress

## 2014-07-26 NOTE — Progress Notes (Signed)
Speech Language Pathology Daily Session Note  Patient Details  Name: Michael Goodwin MRN: [id number] Date of Birth: 24-Sep-1945  Today's Date: 07/26/2014 SLP Individual Time: 5009-3818 SLP Individual Time Calculation (min): 60 min  Short Term Goals: Week 3: SLP Short Term Goal 1 (Week 3): Pt will consume dys 2 trials to continue working towards diet progression with max assist multimodal cues for use of swallowing precautions.  SLP Short Term Goal 1 - Progress (Week 3): Progressing toward goal SLP Short Term Goal 2 (Week 3): Pt will focus attention to functional tasks for 1 minute with max A  multimodal cueing. SLP Short Term Goal 2 - Progress (Week 3): Progressing toward goal SLP Short Term Goal 3 (Week 3): Pt will be oriented x4 with visual external aids with mod A multimodal cueing. SLP Short Term Goal 3 - Progress (Week 3): Progressing toward goal SLP Short Term Goal 4 (Week 3): Pt will recall biographical information with mod A multimodal cueing. SLP Short Term Goal 4 - Progress (Week 3): Progressing toward goal SLP Short Term Goal 5 (Week 3): Pt will demonstrate basic problem solving in functional tasks with max A  verbal cueing. SLP Short Term Goal 5 - Progress (Week 3): Progressing toward goal  Skilled Therapeutic Interventions: Skilled treatment session focused on cognitive and dysphagia goals during family education. SLP facilitated session by providing information in verbal and written form to multiple family members regarding severity of cognitive impairments, need for close 24 hour supervision and 1:1 assistance with functional activities such as medication and finances. Family was educated on pt risk for falls, and need to reorient pt throughout the day. Family was given written information on improving attention and concentration, as well as memory tips. Pt was observed during lunch with 3 family members, who were provided with written safe swallow precautions, information on pt  current diet level/restrictions (puree and nectar thick liquids). Family members were provided with multiple opportunities to ask questions, which were either answered or communicated with the proper individual (request for hospital bed shared with CSW).  Continued speech therapy services are recommended at next venue to further treatment cognitive, speech, and swallowing deficits. ST will continue per POC during CIR stay.   FIM:  Comprehension Comprehension Mode: Auditory Comprehension: 2-Understands basic 25 - 49% of the time/requires cueing 51 - 75% of the time Expression Expression Mode: Verbal Expression: 2-Expresses basic 25 - 49% of the time/requires cueing 50 - 75% of the time. Uses single words/gestures. Social Interaction Social Interaction: 2-Interacts appropriately 25 - 49% of time - Needs frequent redirection. Problem Solving Problem Solving: 2-Solves basic 25 - 49% of the time - needs direction more than half the time to initiate, plan or complete simple activities Memory Memory: 2-Recognizes or recalls 25 - 49% of the time/requires cueing 51 - 75% of the time FIM - Eating Eating Activity: 4: Helper checks for pocketed food  Pain Pain Assessment Pain Assessment: No/denies pain  Therapy/Group: Individual Therapy   Celia B. Thoreau, [facility], CCC-SLP [phone]  Leigh Aurora 07/26/2014, 4:35 PM

## 2014-07-26 NOTE — Progress Notes (Signed)
Occupational Therapy Session Note  Patient Details  Name: Michael Goodwin MRN: [id number] Date of Birth: July 23, 1945  Today's Date: 07/26/2014 OT Individual Time: 1000-1058 OT Individual Time Calculation (min): 58 min  and Today's Date: 07/26/2014 OT Missed Time: 17 Minutes Missed Time Reason: Patient fatigue   Short Term Goals: Week 3:  OT Short Term Goal 1 (Week 3): STGs= LTGs secondary to planned upcoming discharge  Skilled Therapeutic Interventions/Progress Updates:  Pt transitioning easily from PT session. Pt with no c/o pain this session and seated in tilt in space wheelchair. His son, daughter, and family friend present for family education as they will be his caregivers upon his return home. Pt already fully dressed. OT educated caregivers on pt progress towards goals. OT recommended bathing and dressing completed from bed level secondary to safety and to meet pt needs. Caregivers verbalized understanding. OT educated and demonstrated toilet transfer from wheelchair <> drop arm commode chair. Each caregiver taking turns with transfer in order to demonstrate understanding. Therapist provided verbal cues for caregivers to address body mechanics as they were not bending knees enough and observed to be lifting more with back and UEs. OT provided education regarding expectations and caregivers advocating for pt needs with home health OT recommendation. Pt began to get very frustrated and fatigued from multiple bouts of transfers and requesting to rest. Pt seated in wheelchair with QRB donned and call bell within reach. Caregivers with no further questions this session.   Therapy Documentation Precautions:  Precautions Precautions: Fall Precaution Comments: decreased attention, pusher to the right side, decreased trunk control and posture Restrictions Weight Bearing Restrictions: No General: General OT Amount of Missed Time: 17 Minutes Pain: Pain Assessment Pain Assessment: No/denies  pain  See FIM for current functional status  Therapy/Group: Individual Therapy  Lowella Grip 07/26/2014, 11:26 AM

## 2014-07-26 NOTE — Patient Care Conference (Signed)
Inpatient RehabilitationTeam Conference and Plan of Care Update Date: 07/26/2014   Time: 11:15 AM    Patient Name: Michael Goodwin      Medical Record Number: [id number]  Date of Birth: Apr 28, 1945 Sex: Male         Room/Bed: 4W15C/4W15C-01 Payor Info: Payor: MEDICARE / Plan: MEDICARE PART A AND B / Product Type: *No Product type* /    Admitting Diagnosis: CVA  Admit Date/Time:  07/07/2014  5:08 PM Admission Comments: No comment available   Primary Diagnosis:  Nontraumatic thalamic hemorrhage Principal Problem: Nontraumatic thalamic hemorrhage  Patient Active Problem List   Diagnosis Date Noted  . Alterations of sensations following CVA (cerebrovascular accident) 07/10/2014  . Acute respiratory failure with hypoxia 07/07/2014  . Abnormal urinalysis 07/07/2014  . Hypokalemia 07/07/2014  . Dehydration 07/07/2014  . Metabolic encephalopathy 07/07/2014  . Aortic root dilatation (48 mm May 2016) 07/07/2014  . Ascending aorta dilatation (34mm May 2016) 07/07/2014  . ICH (intracerebral hemorrhage) 07/07/2014  . 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 Hemiplegia affecting right dominant side 07/06/2014  . Cognitive deficit due to recent stroke 07/06/2014  . IVH (intraventricular hemorrhage) 07/06/2014  . Essential hypertension 07/06/2014  . Intracranial hemorrhage     Expected Discharge Date: Expected Discharge Date: 08/01/14  Team Members Present: Physician leading conference: Dr. Claudette Laws Social Worker Present: Staci Acosta, LCSW Nurse Present: Carmie End, RN PT Present: Harriet Butte, PT OT Present: Perrin Maltese, Domenic Schwab, OT SLP Present: Jackalyn Lombard, SLP PPS Coordinator present : Tora Duck, RN, CRRN     Current Status/Progress Goal Weekly Team Focus  Medical   impulsivity with swallow, cognition interferes with swallow , needs constant cues for turns during checkers  home with family  family ed   Bowel/Bladder   Pt continent of bowel and bladder.  Pt uses brief and notifes staff for need to use BSC or urinal. urgancy with bladder  level 5 assist for bladder and level 4 for bowel  timed toleiting-    Swallow/Nutrition/ Hydration   Remains on dys 1 textures, nectar thick liquids with full supervision for use of swallowing precautions.  Trials of advanced consistencies limited due to cognition   min assist  continue addressing toleration of current diet, trials of advanced consistencies per improved mentation   ADL's   Pt mod assist for UB bathing and dressing, max assist for LB.  Max assist for stand pivot transfers.  Attention level has improved.  Increased flexor tone in the right elbow and hand but pt does exhibit Brunnstrum stage III movement in the arm and hand and consistently attempts to use it functionally with selfcare tasks.  Right visual inattention as well.   supervision to min assist level  selfcare re-training, neuromuscular re-education, transfer training, cognitive re-training, pt/family education, RUE exercise   Mobility   Pt currently mod/max A bed mobility, max A for transfers (squat pivot), max to +2A for standing and +2A for any dynamic standing and gaitd  Downgraded to overall modA w/c level  RNMR, alertness, attention, transfers, sitting and standing balance, pt/family education   Communication   severe dysarthria  min assist   continue addressing slow rate, increased vocal intensity    Safety/Cognition/ Behavioral Observations  max to total   min-mod assist, downgraded   continue addressing orientation, right inattention, focused/sustained attention to tasks, alertness    Pain   no complaints of pain         Skin   abrasion right elbow-foam inpalce  no new breakdown min assist  assess skin q shift    Rehab Goals Patient on target to meet rehab goals: Yes Rehab Goals Revised: after pt's goals were downgraded last week *See Care Plan and progress notes for long and short-term goals.  Barriers to  Discharge: safety awarness concerns    Possible Resolutions to Barriers:  see above    Discharge Planning/Teaching Needs:  Pt to go to his home at d/c with dtr, son, and family friend taking turns being with pt.  Therapy team is concerned pt's care needs may be greater than what family can provide at home.  Pt's dtr, son, and family friend are here for family education.  They feel they can handle pt at home and want to give it a try.   Team Discussion:  Dr. Wynn Banker feels pt is doing better this morning - eating better, sleeping better, and talking more.  Pt is more awake with ST, but has brief attention which makes progress with cognition and swallowing harder.  Pt is functionally participating with constant cues and max assist for everything.  OT reports they have concerns with pt's safety awareness.  Family education is in progress, but team wants more time for family education, so d/c date extended.  Pt is moving his arm a bit and is trying to use it.  PT is working on a w/c for pt.  Revisions to Treatment Plan:  Pt's d/c changed to 08-01-14.   Continued Need for Acute Rehabilitation Level of Care: The patient requires daily medical management by a physician with specialized training in physical medicine and rehabilitation for the following conditions: Daily direction of a multidisciplinary physical rehabilitation program to ensure safe treatment while eliciting the highest outcome that is of practical value to the patient.: Yes Daily medical management of patient stability for increased activity during participation in an intensive rehabilitation regime.: Yes Daily analysis of laboratory values and/or radiology reports with any subsequent need for medication adjustment of medical intervention for : Neurological problems;Other  Michael Goodwin, Vista Deck 07/26/2014, 2:30 PM

## 2014-07-26 NOTE — Progress Notes (Signed)
Social Work Patient ID: Michael Goodwin, male   DOB: Nov 05, 1945, 69 y.o.   MRN: [id number]   Nigel Sloop, LCSW Social Worker Signed  Patient Care Conference 07/26/2014  2:30 PM    Expand All Collapse All   Inpatient RehabilitationTeam Conference and Plan of Care Update Date: 07/26/2014   Time: 11:15 AM     Patient Name: Michael Goodwin       Medical Record Number: [id number]  Date of Birth: 01-13-1946 Sex: Male         Room/Bed: 4W15C/4W15C-01 Payor Info: Payor: MEDICARE / Plan: MEDICARE PART A AND B / Product Type: *No Product type* /    Admitting Diagnosis: CVA  Admit Date/Time:  07/07/2014  5:08 PM Admission Comments: No comment available   Primary Diagnosis:  Nontraumatic thalamic hemorrhage Principal Problem: Nontraumatic thalamic hemorrhage    Patient Active Problem List     Diagnosis  Date Noted   .  Alterations of sensations following CVA (cerebrovascular accident)  07/10/2014   .  Acute respiratory failure with hypoxia  07/07/2014   .  Abnormal urinalysis  07/07/2014   .  Hypokalemia  07/07/2014   .  Dehydration  07/07/2014   .  Metabolic encephalopathy  07/07/2014   .  Aortic root dilatation (48 mm May 2016)  07/07/2014   .  Ascending aorta dilatation (53mm May 2016)  07/07/2014   .  ICH (intracerebral hemorrhage)  07/07/2014   .  Nontraumatic thalamic hemorrhage  07/06/2014   .  Hemiplegia affecting right dominant side  07/06/2014   .  Cognitive deficit due to recent stroke  07/06/2014   .  IVH (intraventricular hemorrhage)  07/06/2014   .  Essential hypertension  07/06/2014   .  Intracranial hemorrhage       Expected Discharge Date: Expected Discharge Date: 08/01/14  Team Members Present: Physician leading conference: Dr. Claudette Laws Social Worker Present: Staci Acosta, LCSW Nurse Present: Carmie End, RN PT Present: Harriet Butte, PT OT Present: Perrin Maltese, Domenic Schwab, OT SLP Present: Jackalyn Lombard, SLP PPS Coordinator present : Tora Duck, RN,  CRRN        Current Status/Progress  Goal  Weekly Team Focus   Medical     impulsivity with swallow, cognition interferes with swallow , needs constant cues for turns during checkers  home with family  family ed   Bowel/Bladder     Pt continent of bowel and bladder. Pt uses brief and notifes staff for need to use BSC or urinal. urgancy with bladder   level 5 assist for bladder and level 4 for bowel   timed toleiting-    Swallow/Nutrition/ Hydration     Remains on dys 1 textures, nectar thick liquids with full supervision for use of swallowing precautions.  Trials of advanced consistencies limited due to cognition   min assist  continue addressing toleration of current diet, trials of advanced consistencies per improved mentation   ADL's     Pt mod assist for UB bathing and dressing, max assist for LB.  Max assist for stand pivot transfers.  Attention level has improved.  Increased flexor tone in the right elbow and hand but pt does exhibit Brunnstrum stage III movement in the arm and hand and consistently attempts to use it functionally with selfcare tasks.  Right visual inattention as well.   supervision to min assist level  selfcare re-training, neuromuscular re-education, transfer training, cognitive re-training, pt/family education, RUE exercise    Mobility  Pt currently mod/max A bed mobility, max A for transfers (squat pivot), max to +2A for standing and +2A for any dynamic standing and gaitd  Downgraded to overall modA w/c level   RNMR, alertness, attention, transfers, sitting and standing balance, pt/family education   Communication     severe dysarthria  min assist   continue addressing slow rate, increased vocal intensity     Safety/Cognition/ Behavioral Observations    max to total   min-mod assist, downgraded   continue addressing orientation, right inattention, focused/sustained attention to tasks, alertness    Pain     no complaints of pain         Skin     abrasion right  elbow-foam inpalce  no new breakdown min assist  assess skin q shift    Rehab Goals Patient on target to meet rehab goals: Yes Rehab Goals Revised: after pt's goals were downgraded last week *See Care Plan and progress notes for long and short-term goals.    Barriers to Discharge:  safety awarness concerns     Possible Resolutions to Barriers:   see above     Discharge Planning/Teaching Needs:   Pt to go to his home at d/c with dtr, son, and family friend taking turns being with pt.  Therapy team is concerned pt's care needs may be greater than what family can provide at home.  Pt's dtr, son, and family friend are here for family education.  They feel they can handle pt at home and want to give it a try.    Team Discussion:    Dr. Wynn Banker feels pt is doing better this morning - eating better, sleeping better, and talking more.  Pt is more awake with ST, but has brief attention which makes progress with cognition and swallowing harder.  Pt is functionally participating with constant cues and max assist for everything.  OT reports they have concerns with pt's safety awareness.  Family education is in progress, but team wants more time for family education, so d/c date extended.  Pt is moving his arm a bit and is trying to use it.  PT is working on a w/c for pt.   Revisions to Treatment Plan:    Pt's d/c changed to 08-01-14.    Continued Need for Acute Rehabilitation Level of Care: The patient requires daily medical management by a physician with specialized training in physical medicine and rehabilitation for the following conditions: Daily direction of a multidisciplinary physical rehabilitation program to ensure safe treatment while eliciting the highest outcome that is of practical value to the patient.: Yes Daily medical management of patient stability for increased activity during participation in an intensive rehabilitation regime.: Yes Daily analysis of laboratory values and/or  radiology reports with any subsequent need for medication adjustment of medical intervention for : Neurological problems;Other  Kveon Casanas, Vista Deck 07/26/2014, 2:30 PM

## 2014-07-26 NOTE — Progress Notes (Signed)
Physical Therapy Session Note  Patient Details  Name: Michael Goodwin MRN: [id number] Date of Birth: 1945-06-23  Today's Date: 07/26/2014 PT Individual Time: 0900-1000 PT Individual Time Calculation (min): 60 min   Short Term Goals: Week 3:  PT Short Term Goal 1 (Week 3): =LTG's due to ELOS (downgraded 6/11 due to lack of progress)  Skilled Therapeutic Interventions/Progress Updates:   Pt received sitting in w/c in room, daughter, son and family friend present during session to participate in hands on family training for safe D/C.  Educated on importance of continued family training for safe D/C, concerns for D/C home vs SNF, cognitive deficits and impact of those on mobility.  Note that pt with gown, therefore educated on benefits of donning clothing at bed level prior to getting out of bed for increased safety.  Assisted with standing pt while family assisted with pulling up pants, however noted that pt incontinent of bladder, therefore had family assist with peri care and pulling up clothing.  Remainder of session focused on w/c<>bed transfers via squat pivot as well as bed mobility in ADL apt to better simulate home.  PT educated and demonstrated w/c set up, removal of parts as well as how to correctly perform transfer and bed mobility as well as safety concerns while pt is sitting at EOB.  All three family members then returned demonstration with continued cues for set up, sequencing, safe body mechanics, and utilizing Bobath method to increase forward weight shift.  Pts family states that there is room in house for w/c to fit through all doorways and restroom.  Also discussed having pts family attend tomorrow's session in order to perform continued transfers and car transfer.  All verbalized understanding.  Pt assisted back to room and left in w/c with quick release belt donned.   Therapy Documentation Precautions:  Precautions Precautions: Fall Precaution Comments: decreased attention, pusher  to the right side, decreased trunk control and posture Restrictions Weight Bearing Restrictions: No   Vital Signs: Therapy Vitals Temp: 97.8 F (36.6 C) Temp Source: Oral Pulse Rate: 63 Resp: 18 BP: 134/88 mmHg Patient Position (if appropriate): Lying Oxygen Therapy SpO2: 97 % O2 Device: Not Delivered Pain: Pt with no c/o pain during session.    See FIM for current functional status  Therapy/Group: Individual Therapy  Vista Deck 07/26/2014, 7:43 AM

## 2014-07-26 NOTE — Progress Notes (Signed)
Physical Therapy Session Note  Patient Details  Name: Stylez Fundora MRN: [id number] Date of Birth: 29-Jun-1945  Today's Date: 07/26/2014 PT Individual Time: 1500-1530 PT Individual Time Calculation (min): 30 min   Short Term Goals: Week 3:  PT Short Term Goal 1 (Week 3): =LTG's due to ELOS (downgraded 6/11 due to lack of progress)  Skilled Therapeutic Interventions/Progress Updates:   Pt received lying in bed, agreeable to therapy session with mild coaxing.  Performed bed mobility at mod A level with step by step cues for sequencing through task.  Once at EOB, assisted with donning shoes prior to leaving room.  Transferred to w/c via squat pivot at mod/max A level with cues for safety, sequencing and increased forward weight shift.  Once in w/c, pt stating needing to urinate, therefore assisted into standing via max A with RN in room to assist with urinal and clothing management.  Max cues for posture and increased lean to the L during standing due to pusher tendencies.  Assisted to therapy gym and remainder of session focused on functional transfers w/c<>mat as well as forward weight shift with lift offs to carry over to improved transfers.  Performed transfers as above with noted better attention to task.  Performed 6 lift offs during session with max A for weight shift and to elevate buttocks with increased WB through LEs.  Also had pt utilize RUE with [facility] assist to place cups that he retrieved to the R for increased attention to R arm.  Tolerated well. Transferred back to w/c and left at nursing station with quick release belt donned.   Therapy Documentation Precautions:  Precautions Precautions: Fall Precaution Comments: decreased attention, pusher to the right side, decreased trunk control and posture Restrictions Weight Bearing Restrictions: No   Pain: Pain Assessment Pain Assessment: No/denies pain   Locomotion : Wheelchair Mobility Distance: 60   See FIM for current functional  status  Therapy/Group: Individual Therapy  Vista Deck 07/26/2014, 12:55 PM

## 2014-07-26 NOTE — Progress Notes (Signed)
Social Work Patient ID: Michael Goodwin, male   DOB: December 14, 1945, 69 y.o.   MRN: [id number]   CSW met with pt and his dtr to update them on team conference discussion.  Pt's dtr, son, and family friend are here for family education and she feels it is going well and that they can provide the care he is going to need at home.  Dtr told CSW that a lot of times pt would even have 2 caregivers with him.  Pt's siblings are really pushing for pt to go to a facility, but dtr does not want that and feels they can do a better job with him at home.  She is committed to caring for him and CSW told her that she and her brother are next of kin and can make this decision.  Also, pt can be admitted to a SNF within 30 days if the care is too much for the family.  Dtr was relieved to hear that she can make this decision and that she can still get help if needed, although she does not anticipate needed SNF care.  CSW will continue to follow and assist with DME and f/u therapies.

## 2014-07-27 ENCOUNTER — Inpatient Hospital Stay (HOSPITAL_COMMUNITY): Payer: Medicare Other

## 2014-07-27 ENCOUNTER — Inpatient Hospital Stay (HOSPITAL_COMMUNITY): Payer: Medicare Other | Admitting: Occupational Therapy

## 2014-07-27 ENCOUNTER — Encounter (HOSPITAL_COMMUNITY): Payer: Medicare Other | Admitting: Occupational Therapy

## 2014-07-27 ENCOUNTER — Inpatient Hospital Stay (HOSPITAL_COMMUNITY): Payer: Medicare Other | Admitting: Speech Pathology

## 2014-07-27 NOTE — Progress Notes (Signed)
Occupational Therapy Session Note  Patient Details  Name: Michael Goodwin MRN: [id number] Date of Birth: Sep 22, 1945  Today's Date: 07/27/2014 OT Individual Time: 0905-1000 OT Individual Time Calculation (min): 55 min    Short Term Goals: Week 3:  OT Short Term Goal 1 (Week 3): STGs= LTGs secondary to planned upcoming discharge  Skilled Therapeutic Interventions/Progress Updates:    Pt performed shower and dressing this session.  He was able to initiate and complete doffing his clothing with mod instructional cueing and mod assist overall.  He continues to use the RUE as an active assist with max facilitation secondary to increased flexor tone at the elbow.  Max instructional cueing needed to sequence bathing but he is able to follow one step commands consistently to wash different body parts.  Decreased visual attention to the right side for locating washcloth or even his RUE when attempting to use it for bathing.  Max assist for standing balance when washing peri area however increased flexor tone in the right hamstring.  Pt needs max facilitation as well for crossing and maintaining the RLE over the left knee during bathing and dressing tasks.  He continues to need max demonstrational cueing to sequence dressing as he attempts to donn the LUE and LLE before the right.  Overall min assist for dynamic sitting balance with all tasks this session.  Pt left in tilt in space wheelchair at the nurses station with safety belt in place and arm trough supporting the RUE.   Therapy Documentation Precautions:  Precautions Precautions: Fall Precaution Comments: decreased attention, pusher to the right side, decreased trunk control and posture Restrictions Weight Bearing Restrictions: No  Pain: Pain Assessment Pain Assessment: No/denies pain ADL: See FIM for current functional status  Therapy/Group: Individual Therapy  Sherrina Zaugg OTR/L 07/27/2014, 11:41 AM

## 2014-07-27 NOTE — Progress Notes (Signed)
Speech Language Pathology Daily Session Note  Patient Details  Name: Michael Goodwin MRN: [id number] Date of Birth: 12-14-1945  Today's Date: 07/27/2014 SLP Individual Time: 1100-1200 SLP Individual Time Calculation (min): 60 min  Short Term Goals: Week 3: SLP Short Term Goal 1 (Week 3): Pt will consume dys 2 trials to continue working towards diet progression with max assist multimodal cues for use of swallowing precautions.  SLP Short Term Goal 1 - Progress (Week 3): Progressing toward goal SLP Short Term Goal 2 (Week 3): Pt will focus attention to functional tasks for 1 minute with max A  multimodal cueing. SLP Short Term Goal 2 - Progress (Week 3): Progressing toward goal SLP Short Term Goal 3 (Week 3): Pt will be oriented x4 with visual external aids with mod A multimodal cueing. SLP Short Term Goal 3 - Progress (Week 3): Progressing toward goal SLP Short Term Goal 4 (Week 3): Pt will recall biographical information with mod A multimodal cueing. SLP Short Term Goal 4 - Progress (Week 3): Progressing toward goal SLP Short Term Goal 5 (Week 3): Pt will demonstrate basic problem solving in functional tasks with max A  verbal cueing. SLP Short Term Goal 5 - Progress (Week 3): Progressing toward goal  Skilled Therapeutic Interventions:  Pt was seen for skilled ST targeting goals for cognition and dysphagia.  Pt was received sitting upright at nursing station, awake, alert, and agreeable to participate in ST.  No family present for ongoing education prior to discharge.  SLP facilitated the session with a basic card game activity targeting sustained attention and functional problem solving.  Pt attended to task for ~5 minutes before requiring cues for redirection.  Suspect lethargy began to impact pt's attention to task and motivation to participate in therapies, because he became increasingly more difficult to redirect to task and requested to take a rest break.  Even during a cognitive rest break,  pt remained restless and impulsively moved around in his chair.  SLP also facilitated the session with a trial snack of dys 2 textures to continue working towards diet progression.  Pt consumed Dys 2 textures with min-mod verbal cues for use of safe swallowing strategies to remove mild right sided buccal residue from the oral cavity post swallow.  SLP will continue to trial advanced consistencies in therapy and will advance pt's solids per improved mentation.  Pt left upright in wheelchair with quick release belt donned at nursing station.  Continue per current plan of care.    FIM:  Comprehension Comprehension Mode: Auditory Comprehension: 2-Understands basic 25 - 49% of the time/requires cueing 51 - 75% of the time Expression Expression Mode: Verbal Expression: 2-Expresses basic 25 - 49% of the time/requires cueing 50 - 75% of the time. Uses single words/gestures. Social Interaction Social Interaction: 2-Interacts appropriately 25 - 49% of time - Needs frequent redirection. Problem Solving Problem Solving: 2-Solves basic 25 - 49% of the time - needs direction more than half the time to initiate, plan or complete simple activities Memory Memory: 2-Recognizes or recalls 25 - 49% of the time/requires cueing 51 - 75% of the time FIM - Eating Eating Activity: 4: Helper checks for pocketed food;4: Help with picking up utensils;5: Needs verbal cues/supervision  Pain Pain Assessment Pain Assessment: No/denies pain  Therapy/Group: Individual Therapy  Kamilia Carollo, Melanee Spry 07/27/2014, 4:50 PM

## 2014-07-27 NOTE — Progress Notes (Signed)
Occupational Therapy Session Note  Patient Details  Name: Michael Goodwin MRN: [id number] Date of Birth: 01/04/1946  Today's Date: 07/27/2014 OT Individual Time: 1530-1545 OT Individual Time Calculation (min): 15 min    Short Term Goals: Week 3:  OT Short Term Goal 1 (Week 3): STGs= LTGs secondary to planned upcoming discharge  Skilled Therapeutic Interventions/Progress Updates:  Pt received seated in wheelchair at RN station. Pt with no c/o pain but requesting to return to bed. OT assisted pt back to room via total A in wheelchair. Therapist performed all set up of wheelchair for transfer. Max A stand pivot transfer towards L side into bed from wheelchair. Once seated EOB, pt required steady assist while therapist removed B shoes from feet. Mod A sit >supine for B LEs. Call bell within reach although pt not able to return demonstration for how to call RN. Bed alarm activated and RN notified of pt returning to bed.  Therapy Documentation Precautions:  Precautions Precautions: Fall Precaution Comments: decreased attention, pusher to the right side, decreased trunk control and posture Restrictions Weight Bearing Restrictions: No Vital Signs: Therapy Vitals Temp: 98.6 F (37 C) Temp Source: Oral Pulse Rate: 68 Resp: 18 BP: 132/85 mmHg Patient Position (if appropriate): Sitting Oxygen Therapy SpO2: 98 % O2 Device: Not Delivered  See FIM for current functional status  Therapy/Group: Individual Therapy  Lowella Grip 07/27/2014, 3:56 PM

## 2014-07-27 NOTE — Progress Notes (Signed)
Physical Therapy Session Note  Patient Details  Name: Michael Goodwin MRN: [id number] Date of Birth: [birth year]  Today's Date: 07/27/2014 PT Individual Time: 1330-1500 PT Individual Time Calculation (min): 90 min   Short Term Goals: Week 1:  PT Short Term Goal 1 (Week 1): patient will be able to perform supine to sit transfer with min A. PT Short Term Goal 1 - Progress (Week 1): Progressing toward goal PT Short Term Goal 2 (Week 1): Patient will be able to maintain sitting EOB with  min A and reduced R side push/lean. PT Short Term Goal 2 - Progress (Week 1): Progressing toward goal PT Short Term Goal 3 (Week 1): Patient will be able to perform sit to stand transfer with mod A and maintain standing with mod A for up to 1 min PT Short Term Goal 3 - Progress (Week 1): Progressing toward goal PT Short Term Goal 4 (Week 1): Patient will be able to perform transfer to w/c with mod A PT Short Term Goal 4 - Progress (Week 1): Progressing toward goal PT Short Term Goal 5 (Week 1): Patient will be able to ambulate with LRAD on a distance of 10 feet with mod A PT Short Term Goal 5 - Progress (Week 1): Progressing toward goal Week 2:  PT Short Term Goal 1 (Week 2): Pt will perform bed mobility with mod A with 75% cues to attend to RUE/LE PT Short Term Goal 1 - Progress (Week 2): Met PT Short Term Goal 2 (Week 2): Pt will perform functional transfers R and L at mod A level with 75% cues for technique PT Short Term Goal 2 - Progress (Week 2): Progressing toward goal PT Short Term Goal 3 (Week 2): Pt will perform sit<>stand with mod A with 75% cues to attend to L environment PT Short Term Goal 3 - Progress (Week 2): Progressing toward goal PT Short Term Goal 4 (Week 2): Pt will perform dynamic standing tasks at max A level for 3 mins to increase independence with ADLs.  PT Short Term Goal 4 - Progress (Week 2): Progressing toward goal PT Short Term Goal 5 (Week 2): Pt will ambulate x 10' with LRAD and +2A  for safety with pt assist approx 30%.  PT Short Term Goal 5 - Progress (Week 2): Progressing toward goal Week 3:  PT Short Term Goal 1 (Week 3): =LTG's due to ELOS (downgraded 6/11 due to lack of progress)  Skilled Therapeutic Interventions/Progress Updates:    Session was scheduled for family education but pt's family called ahead of time and reported having car trouble. Planning to be here tomorrow at 9am to start family education. Note left with scheduling team and CSW made aware.  Session focused on neuro re-ed to addresss transfers, dynamic standing balance, gait, and postural control, bed mobility on flat surface with cues for technique and min A for RLE management, safety with mobility and addressing cognitive deficits throughout session to improve carryover and safety awareness, strengthening exercises for bridging in supine x 2 sets of 10 reps (with pillow for adduction bias) to aid with overall bed mobility and transfers, gait training with +2 assist (three muskateers style) with mod to max A of 2 and facilitation for weightshift as well as advancement of RLE and blocking knee, and initiating w/c mobility in basic manual w/c with up to max A needed for hand over hand assist using hemi technique and max verbal cues for attention and sequencing. Dual task balance and cognitive activity  in standing requiring up to max A to return to midline due to pushing tendencies with +2 for safety.  Pt required increased encouragement towards end of session due to fatigue and decreased attention to functional tasks. Returned to bed end of session with max verbal cues for w/c set-up and sequencing for transfer back to bed with max A. Bed alarm on and all needs in reach.   Therapy Documentation Precautions:  Precautions Precautions: Fall Precaution Comments: decreased attention, pusher to the right side, decreased trunk control and posture Restrictions Weight Bearing Restrictions: No  Pain:  Denies pain.   Locomotion : Ambulation Ambulation/Gait Assistance: 1: +2 Total assist   See FIM for current functional status  Therapy/Group: Individual Therapy  [name], PT, DPT  07/27/2014, 3:51 PM

## 2014-07-27 NOTE — Progress Notes (Signed)
Subjective/Complaints: Pt alert, impulsive with transfers, can sit briefly unsupported at EOB   Review of Systems - cannot obtain full review secondary to mental status, pt denies pain or breathing issues   Objective: Vital Signs: Blood pressure 133/72, pulse 66, temperature 98.7 F (37.1 C), temperature source Oral, resp. rate 18, height 6' (1.829 m), weight 71.2 kg (156 lb 15.5 oz), SpO2 97 %. No results found. No results found for this or any previous visit (from the past 72 hour(s)).   HEENT: normal Cardio: RRR and no murmur Resp: CTA B/L and unlabored GI: BS positive and NT, ND Extremity:  Pulses positive and No Edema Skin:   Intact Neuro: more alert. Disoriented to time. knew he was in hospital only when given choices. ".  2- to 2/5 Right knee ext,2- right hip flexion  2- right grip and  R biceps flexion, 4- in LUE and LLE  Musc/Skel:  No pain with PROM of limbs today Gen NAD.     Assessment/Plan: 1. 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     FIM: FIM - Bathing Bathing Steps Patient Completed: Chest, Abdomen, Front perineal area, Right Arm, Right upper leg, Left upper leg Bathing: 3: Mod-Patient completes 5-7 86f 10 parts or 50-74%  FIM - Upper Body Dressing/Undressing Upper body dressing/undressing steps patient completed: Pull shirt over trunk, Put head through opening of pull over shirt/dress Upper body dressing/undressing: 3: Mod-Patient completed 50-74% of tasks FIM - Lower Body Dressing/Undressing Lower body dressing/undressing steps patient completed:  (socks only no other public clothing) Lower body dressing/undressing: 1: Two helpers  FIM - Hotel manager Devices: Grab bar or rail for support Toileting: 1: Two helpers  FIM -  Diplomatic Services operational officer Devices: Bedside commode (drop arm commode) Toilet Transfers: 3-From toilet/BSC: Mod A (lift or lower assist), 3-To toilet/BSC: Mod A (lift or lower assist)  FIM - Banker Devices: Arm rests Bed/Chair Transfer: 3: Supine > Sit: Mod A (lifting assist/Pt. 50-74%/lift 2 legs, 3: Bed > Chair or W/C: Mod A (lift or lower assist)  FIM - Locomotion: Wheelchair Distance: 60 Locomotion: Wheelchair: 1: Total Assistance/staff pushes wheelchair (Pt<25%) FIM - Locomotion: Ambulation Locomotion: Ambulation Assistive Devices: Other (comment) ("three muskateer style") Ambulation/Gait Assistance: 1: +2 Total assist Locomotion: Ambulation: 0: Activity did not occur  Comprehension Comprehension Mode: Auditory Comprehension: 2-Understands basic 25 - 49% of the time/requires cueing 51 - 75% of the time  Expression Expression Mode: Verbal Expression: 2-Expresses basic 25 - 49% of the time/requires cueing 50 - 75% of the time. Uses single words/gestures.  Social Interaction Social Interaction Mode: Asleep Social Interaction: 2-Interacts appropriately 25 - 49% of time - Needs frequent redirection.  Problem Solving Problem Solving Mode: Asleep Problem Solving: 2-Solves basic 25 - 49% of the time - needs direction more than half the time to initiate, plan or complete simple activities  Memory Memory Mode: Asleep Memory: 2-Recognizes or recalls 25 - 49% of the time/requires cueing 51 - 75% of the time   Medical Problem List and Plan: 1. 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 DVT Prophylaxis/Anticoagulation: Subcutaneous heparin for DVT prophylaxis initiated 07/06/2014 3. Pain Management: Tylenol as needed 4. Dysphagia. Dysphagia 3 thin liquids. Monitor for any signs of aspiration. Follow-up speech therapy.  5. Neuropsych: This patient is not capable  of making decisions on his own behalf. 6. Skin/Wound Care: Routine skin checks 7. Fluids/Electrolytes/Nutrition:  Intake inconsistent, overall improving  -fluid intake ok, caloric varies 20-100% 8. Hypertension. Lisinopril 20 mg daily, norvasc . Reduce given hypotension., elevated creat but BUN looks ok, will reduce lisinopril 9. Tobacco abuse, counseling when appropriate 10. Sleep-wake: ritalin and remeron for wake/sleep  -sleep chart  -arousal has steadily improved over the last 72+ hours.  LOS (Days) 20 A FACE TO FACE EVALUATION WAS PERFORMED  KIRSTEINS,ANDREW E 07/27/2014, 7:49 AM

## 2014-07-28 ENCOUNTER — Ambulatory Visit (HOSPITAL_COMMUNITY): Payer: Medicare Other | Admitting: Rehabilitation

## 2014-07-28 ENCOUNTER — Inpatient Hospital Stay (HOSPITAL_COMMUNITY): Payer: Medicare Other | Admitting: Speech Pathology

## 2014-07-28 ENCOUNTER — Encounter (HOSPITAL_COMMUNITY): Payer: Medicare Other | Admitting: Occupational Therapy

## 2014-07-28 ENCOUNTER — Inpatient Hospital Stay (HOSPITAL_COMMUNITY): Payer: Medicare Other | Admitting: Occupational Therapy

## 2014-07-28 NOTE — Progress Notes (Signed)
Pt bed alarm was heard going off at 5:40a. Jimmea, NT found pt on floor. NT just left room after taking vitals and offering toileting. Asked pt what he was trying to do he stated "the floor was to small" and "I gotta use the bathroom". Got pt back to bed via [employer]. Rechecked VS and assessed pt. VS stable and no c/o pain or discomfort. Replaced foam dsg on right elbow. Malissa Hippo., PA  Notified @ 5:45a.Called son and daughter and left a message at 5:56a. Son called back at 6:03 and informed of fall. Will continue to montior

## 2014-07-28 NOTE — Progress Notes (Signed)
Physical Therapy Session Note  Patient Details  Name: Michael Goodwin MRN: [id number] Date of Birth: 02-Jan-1946  Today's Date: 07/28/2014 PT Individual Time: 0900-1000 PT Individual Time Calculation (min): 60 min   Short Term Goals: Week 3:  PT Short Term Goal 1 (Week 3): =LTG's due to ELOS (downgraded 6/11 due to lack of progress)  Skilled Therapeutic Interventions/Progress Updates:   Pt received sitting in w/c at nursing station, agreeable to therapy session.  Note that daughter Florentina Addison not present initially therefore assisted pt to therapy gym and transferred to therapy mat via squat pivot at mod A level with cues for sequencing and technique.  Katie then present, therefore had her meet PT and pt out at main entrance in order to practice car transfer to real car.  Assisted outside.  Note that pts personal car door does not open very wide, therefore had to set up w/c somewhat further then comfortable, however PT performed car transfer with pt at max A level and pt did very well advancing LLE to car and sitting as appropriate.  Max cues to attend to RLE into/out of car.  Transferred back to w/c at same level with cues and education to daughter on set up, hand placement, body mechanics and RLE placement, esp when transferring out of car.  Daughter then returned demonstration very well with good body mechanics, min cues for ensuring safe placement of RLE during transfer.  Assisted back to unit and remainder of session focused on switching w/c to 16x16 with J2 deep contour cushion for adequate pressure relief, as well as better positioning with Roho agility back to provide trunk stability.  Would recommend that he have mid contour in personal back to assist with lateral support in w/c.  CSW notified of this order.  Assisted back to room and left in w/c with all needs in reach and SLP present for session.    Therapy Documentation Precautions:  Precautions Precautions: Fall Precaution Comments: decreased  attention, pusher to the right side, decreased trunk control and posture Restrictions Weight Bearing Restrictions: No   Vital Signs: Therapy Vitals BP: 104/76 mmHg Pain: Pt with no c/o pain during session.   See FIM for current functional status  Therapy/Group: Individual Therapy  Vista Deck 07/28/2014, 12:17 PM

## 2014-07-28 NOTE — Progress Notes (Signed)
Speech Language Pathology Daily Session Note  Patient Details  Name: Michael Goodwin MRN: [id number] Date of Birth: 12-Aug-1945  Today's Date: 07/28/2014 SLP Individual Time: 1000-1100 SLP Individual Time Calculation (min): 60 min  Short Term Goals: Week 3: SLP Short Term Goal 1 (Week 3): Pt will consume dys 2 trials to continue working towards diet progression with max assist multimodal cues for use of swallowing precautions.  SLP Short Term Goal 1 - Progress (Week 3): Progressing toward goal SLP Short Term Goal 2 (Week 3): Pt will focus attention to functional tasks for 1 minute with max A  multimodal cueing. SLP Short Term Goal 2 - Progress (Week 3): Progressing toward goal SLP Short Term Goal 3 (Week 3): Pt will be oriented x4 with visual external aids with mod A multimodal cueing. SLP Short Term Goal 3 - Progress (Week 3): Progressing toward goal SLP Short Term Goal 4 (Week 3): Pt will recall biographical information with mod A multimodal cueing. SLP Short Term Goal 4 - Progress (Week 3): Progressing toward goal SLP Short Term Goal 5 (Week 3): Pt will demonstrate basic problem solving in functional tasks with max A  verbal cueing. SLP Short Term Goal 5 - Progress (Week 3): Progressing toward goal  Skilled Therapeutic Interventions:  Pt was seen for skilled ST targeting ongoing family education.  Upon arrival, pt was seated upright in wheelchair with daughter, Michael Goodwin present.  SLP provided skilled education regarding how to thicken liquids and where to purchase thickener.  Katie returned demonstration of how to thicken liquids with supervision/set up assistance.  SLP also reviewed and reinforced rationale behind currently prescribed diet and swallowing precautions.  Pt's daughter is aware that his progress for swallowing function has been significantly limited by his cognition, specifically decreased sustained attention, impulsivity, fluctuating alertness, and poor safety awareness.  SLP informed  Michael Goodwin that while pt may be able to be upgraded for solids at bedside by SLP, he will need an objective swallow study prior to advancement of liquids due to neuro deficits.  Also emphasized that pt will likely be appropriate for an objective swallow test with improved mentation (impulsivity specifically).  SLP also provided skilled education regarding cuing techniques and distraction management strategies to facilitate pt's sustained attention to tasks at home.  Pt's daughter is aware that team is recommending extensive 24/7 supervision and follow up therapies at next level of care.  SLP will continue to provide education prior to discharge.  Continue per current plan of care.    FIM:  Comprehension Comprehension Mode: Auditory Comprehension: 3-Understands basic 50 - 74% of the time/requires cueing 25 - 50%  of the time Expression Expression Mode: Verbal Expression: 2-Expresses basic 25 - 49% of the time/requires cueing 50 - 75% of the time. Uses single words/gestures. Social Interaction Social Interaction: 2-Interacts appropriately 25 - 49% of time - Needs frequent redirection. Problem Solving Problem Solving: 2-Solves basic 25 - 49% of the time - needs direction more than half the time to initiate, plan or complete simple activities Memory Memory: 2-Recognizes or recalls 25 - 49% of the time/requires cueing 51 - 75% of the time FIM - Eating Eating Activity: 4: Helper checks for pocketed food  Pain Pain Assessment Pain Assessment: No/denies pain  Therapy/Group: Individual Therapy  Amyrah Pinkhasov, Melanee Spry 07/28/2014, 5:23 PM

## 2014-07-28 NOTE — Plan of Care (Signed)
Problem: RH Balance Goal: LTG Patient will maintain dynamic standing with ADLs (OT) LTG: Patient will maintain dynamic standing balance with assist during activities of daily living (OT)  Downgraded secondary to progress

## 2014-07-28 NOTE — Progress Notes (Signed)
Occupational Therapy Weekly Progress Note  Patient Details  Name: Michael Goodwin MRN: [id number] Date of Birth: [birth year]  Beginning of progress report period: July 21, 2014 End of progress report period: July 28, 2014  Today's Date: 07/28/2014 OT Individual Time: 1100-1200 and 1530-1600 OT Individual Time Calculation (min): 60 min and 30 min    Patient has met 5 of 12 long term goals.  Short term goals = LTGs secondary to patients upcoming discharge date. Caregivers have been present for family training and education this week. Pt continues to be more alert during sessions and attempting to be an active participant. Pt transferring with Mod A stand pivot or squat pivot. Caregivers have demonstrated the ability to transfer pt wheelchair <> drop arm commode chair, R UE PROM HEP, and bathing and dressing with appropriate care and safety. Education to continue this week until discharge.  Patient continues to demonstrate the following deficits: decreased I in self care, decreased R UE functional ROM and strength, decreased awareness, decreased coordination, decreased balance, decreased safety and therefore will continue to benefit from skilled OT intervention to enhance overall performance with BADL.  See Patient's Care Plan for progression toward long term goals.  Patient progressing toward long term goals..  Continue plan of care.  Skilled Therapeutic Interventions/Progress Updates:  Session 1: Pt seated in wheelchair upon entering the room with daughter, Joellen Jersey, present in room. She is present during session and performing active role during session in anticipation for upcoming discharge home. Cardale Dorer assisted pt with shaving at sink and then transferred pt to bed with Mod A. Pt performing sit >supine with min A . OT recommended LB bathing be completed in bed and UB at EOB or when seated in wheelchair. Pt turned self R and L with supervision and washed face, UB, peri area, buttocks, and B thighs with L  UE and mod instructional cues. Pt required total A for LB self care. OT also educating caregiver on PROM for  R UE while supine in bed. Caregiver is CNA and was able to demonstrate PROM for digits,wrist, elbow, and shoulder in all planes x 10 reps safely. Pt supine in bed with call bell within reach, bed alarm on, and caregiver present in room. Paper handout of PROM exercises left in room for reference if needed.    Session 2:  Pt received at RN station with no c/o pain. Pt assisted to day room via total A in wheelchair. Pt seated at table top for activity. Pt reporting he enjoys cards games as leisure activity. Pt engaged in game of UNO with therapist with min verbal cues for initiation to pick card when he did not have one to be played. Pt kept track of turn taking without difficulty. He only required min verbal cues for correct card selection after playing for over 5 minutes secondary to decreased attention. Pt requiring max verbal cues and selection of 2 choices for orientation to situation and date. Pt reporting he was going home next week and his children would be helping him. He was returned to RN station for safety at end of session.   Therapy Documentation Precautions:  Precautions Precautions: Fall Precaution Comments: decreased attention, pusher to the right side, decreased trunk control and posture Restrictions Weight Bearing Restrictions: No Vital Signs: Therapy Vitals BP: 104/76 mmHg  See FIM for current functional status  Therapy/Group: Individual Therapy  Phineas Semen 07/28/2014, 12:45 PM

## 2014-07-28 NOTE — Progress Notes (Addendum)
Subjective/Complaints: Patient is hungry this morning "where's my Breakfast?" Pt unable to use call bell due to cognition, pt attenpted to used weak right hand to push call bell after instruction   Review of Systems - cannot obtain full review secondary to mental status, pt denies pain or breathing issues   Objective: Vital Signs: Blood pressure 133/77, pulse 62, temperature 97.7 F (36.5 C), temperature source Oral, resp. rate 18, height 6' (1.829 m), weight 71.2 kg (156 lb 15.5 oz), SpO2 98 %. No results found. No results found for this or any previous visit (from the past 72 hour(s)).   HEENT: normal Cardio: RRR and no murmur Resp: CTA B/L and unlabored GI: BS positive and NT, ND Extremity:  Pulses positive and No Edema Skin:   Intact Neuro: more alert. Disoriented to time. knew he was in hospital only when given choices. ".  2- to 2/5 Right knee ext,2- right hip flexion  2- right grip and  R biceps flexion, 4- in LUE and LLE  Musc/Skel:  No pain with PROM of limbs today Gen NAD.     Assessment/Plan: 1. 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     FIM: FIM - Bathing Bathing Steps Patient Completed: Chest, Right Arm, Abdomen, Front perineal area, Right upper leg, Left upper leg Bathing: 3: Mod-Patient completes 5-7 44f 10 parts or 50-74%  FIM - Upper Body Dressing/Undressing Upper body dressing/undressing steps patient completed: Put head through opening of pull over shirt/dress, Pull shirt over trunk Upper body dressing/undressing: 3: Mod-Patient completed 50-74% of tasks FIM - Lower Body Dressing/Undressing Lower body dressing/undressing steps patient completed: Thread/unthread left underwear leg, Thread/unthread left pants leg Lower body dressing/undressing:  2: Max-Patient completed 25-49% of tasks  FIM - Hotel manager Devices: Grab bar or rail for support Toileting: 1: Two helpers  FIM - Diplomatic Services operational officer Devices: Bedside commode (drop arm commode) Toilet Transfers: 3-From toilet/BSC: Mod A (lift or lower assist), 3-To toilet/BSC: Mod A (lift or lower assist)  FIM - Banker Devices: Arm rests Bed/Chair Transfer: 4: Sit > Supine: Min A (steadying pt. > 75%/lift 1 leg), 2: Chair or W/C > Bed: Max A (lift and lower assist)  FIM - Locomotion: Wheelchair Distance: 60 Locomotion: Wheelchair: 2: Travels 50 - 149 ft with maximal assistance (Pt: 25 - 49%) FIM - Locomotion: Ambulation Locomotion: Ambulation Assistive Devices: Other (comment) ("three muskateer style") Ambulation/Gait Assistance: 1: +2 Total assist Locomotion: Ambulation: 1: Two helpers  Comprehension Comprehension Mode: Auditory Comprehension: 2-Understands basic 25 - 49% of the time/requires cueing 51 - 75% of the time  Expression Expression Mode: Verbal Expression: 2-Expresses basic 25 - 49% of the time/requires cueing 50 - 75% of the time. Uses single words/gestures.  Social Interaction Social Interaction Mode: Asleep Social Interaction: 2-Interacts appropriately 25 - 49% of time - Needs frequent redirection.  Problem Solving Problem Solving Mode: Asleep Problem Solving: 2-Solves basic 25 - 49% of the time - needs direction more than half the time to initiate, plan or complete simple activities  Memory Memory Mode: Asleep Memory: 2-Recognizes or recalls 25 - 49% of the time/requires cueing 51 - 75% of the time   Medical Problem List and Plan: 1. 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 DVT  Prophylaxis/Anticoagulation: Subcutaneous heparin for DVT prophylaxis initiated 07/06/2014 3. Pain Management: Tylenol as  needed 4. Dysphagia. Dysphagia 3 thin liquids. Monitor for any signs of aspiration. Follow-up speech therapy.  5. Neuropsych: This patient is not capable of making decisions on his own behalf. 6. Skin/Wound Care: Routine skin checks 7. Fluids/Electrolytes/Nutrition:  Much improved  -fluid intake ok,  8. Hypertension. Lisinopril 20 mg daily, norvasc ., well controlled 9. Tobacco abuse, counseling when appropriate 10. Sleep-wake: ritalin and remeron for wake/sleep  -sleep chart  -arousal has steadily improved   LOS (Days) 21 A FACE TO FACE EVALUATION WAS PERFORMED  Michael Goodwin E 07/28/2014, 8:06 AM

## 2014-07-29 ENCOUNTER — Inpatient Hospital Stay (HOSPITAL_COMMUNITY): Payer: Medicare Other | Admitting: Occupational Therapy

## 2014-07-29 NOTE — Progress Notes (Signed)
Occupational Therapy Session Note  Patient Details  Name: Michael Goodwin MRN: [id number] Date of Birth: [birth year]  Today's Date: 07/29/2014 OT Individual Time: 1030-1100 OT Individual Time Calculation (min): 30 min    Short Term Goals: Week 4:  OT Short Term Goal 1 (Week 4): STGs= LTGs secondary to upcoming discharge  Skilled Therapeutic Interventions/Progress Updates:  Upon entering the room, pt seated in wheelchair with RN present providing medication. Pt with no c/o pain but reporting, " I am hungry. This (medicine) is nasty." OT assisted pt via total A in wheelchair to day room to sit at table for feeding. OT helping pt to place magic cup into R hand and then pt able to scoop food onto spoon and feed self with L UE. Pt required verbal cues for small bites. He was also unable to perform enough R digit extension to release cup that if he moved hand to lap he would take cup with him instead. Pt ate entire cup of ice cream with assistance to get last scoop of food from container. Pt did remember therapist name this session but was not oriented to time or situation. Pt returned to sit at RN station while remaining in wheelchair for safety.  Therapy Documentation Precautions:  Precautions Precautions: Fall Precaution Comments: decreased attention, pusher to the right side, decreased trunk control and posture Restrictions Weight Bearing Restrictions: No  See FIM for current functional status  Therapy/Group: Individual Therapy  Lowella Grip 07/29/2014, 12:33 PM

## 2014-07-29 NOTE — Progress Notes (Signed)
Patient ID: Michael Goodwin, male   DOB: Jan 18, 1946, 69 y.o.   MRN: [id number]  07/29/14.     Subjective/Complaints:  69 y/o admit for CIR with functional deficits secondary to left thalamic intracranial hemorrhage secondary to hypertensive crisis  Alert, no complaints; comfortable night    Review of Systems - cannot obtain full review secondary to mental status, pt denies pain or breathing issues      Objective: Vital Signs: Blood pressure 108/72, pulse 83, temperature 98.3 F (36.8 C), temperature source Oral, resp. rate 18, height 6' (1.829 m), weight 156 lb 15.5 oz (71.2 kg), SpO2 100 %. No results found. No results found for this or any previous visit (from the past 72 hour(s)).   HEENT: normal Cardio: RRR and no murmur Resp: CTA B/L and unlabored GI: BS positive and NT, ND Extremity:  Pulses positive and No Edema Skin:   Intact  TED hose in place  Neuro: more alert. R hemiparesis Musc/Skel:  No pain with PROM of limbs today Gen NAD.    Patient Vitals for the past 24 hrs:  BP Temp Temp src Pulse Resp SpO2  07/29/14 0550 108/72 mmHg 98.3 F (36.8 C) Oral 83 18 100 %  07/29/14 0150 116/74 mmHg 98.2 F (36.8 C) Oral 73 18 98 %  07/28/14 2147 104/74 mmHg 97.7 F (36.5 C) Oral 69 18 99 %  07/28/14 1350 116/70 mmHg 98.2 F (36.8 C) Oral 63 18 100 %  07/28/14 1015 104/76 mmHg 98.2 F (36.8 C) Oral 65 18 99 %     Intake/Output Summary (Last 24 hours) at 07/29/14 1006 Last data filed at 07/29/14 0900  Gross per 24 hour  Intake    360 ml  Output    400 ml  Net    -40 ml     Medical Problem List and Plan: 1. 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 DVT Prophylaxis/Anticoagulation: Subcutaneous heparin for DVT prophylaxis initiated 07/06/2014  3. Dysphagia. Dysphagia 3 thin liquids. Monitor for any signs of aspiration. Follow-up speech therapy.   4.  Hypertension. Lisinopril 20 mg daily, norvasc  10mg ., well controlled 5. Tobacco abuse, counseling when appropriate   LOS (Days) 22 A FACE TO FACE EVALUATION WAS PERFORMED  Rogelia Boga 07/29/2014, 10:03 AM

## 2014-07-30 ENCOUNTER — Inpatient Hospital Stay (HOSPITAL_COMMUNITY): Payer: Medicare Other | Admitting: Physical Therapy

## 2014-07-30 NOTE — Progress Notes (Signed)
Physical Therapy Session Note  Patient Details  Name: Michael Goodwin MRN: [id number] Date of Birth: 1945-08-05  Today's Date: 07/30/2014 PT Individual Time: 0800-0845 PT Individual Time Calculation (min): 45 min   Short Term Goals: Week 1:  PT Short Term Goal 1 (Week 1): patient will be able to perform supine to sit transfer with min A. PT Short Term Goal 1 - Progress (Week 1): Progressing toward goal PT Short Term Goal 2 (Week 1): Patient will be able to maintain sitting EOB with  min A and reduced R side push/lean. PT Short Term Goal 2 - Progress (Week 1): Progressing toward goal PT Short Term Goal 3 (Week 1): Patient will be able to perform sit to stand transfer with mod A and maintain standing with mod A for up to 1 min PT Short Term Goal 3 - Progress (Week 1): Progressing toward goal PT Short Term Goal 4 (Week 1): Patient will be able to perform transfer to w/c with mod A PT Short Term Goal 4 - Progress (Week 1): Progressing toward goal PT Short Term Goal 5 (Week 1): Patient will be able to ambulate with LRAD on a distance of 10 feet with mod A PT Short Term Goal 5 - Progress (Week 1): Progressing toward goal Week 2:  PT Short Term Goal 1 (Week 2): Pt will perform bed mobility with mod A with 75% cues to attend to RUE/LE PT Short Term Goal 1 - Progress (Week 2): Met PT Short Term Goal 2 (Week 2): Pt will perform functional transfers R and L at mod A level with 75% cues for technique PT Short Term Goal 2 - Progress (Week 2): Progressing toward goal PT Short Term Goal 3 (Week 2): Pt will perform sit<>stand with mod A with 75% cues to attend to L environment PT Short Term Goal 3 - Progress (Week 2): Progressing toward goal PT Short Term Goal 4 (Week 2): Pt will perform dynamic standing tasks at max A level for 3 mins to increase independence with ADLs.  PT Short Term Goal 4 - Progress (Week 2): Progressing toward goal PT Short Term Goal 5 (Week 2): Pt will ambulate x 10' with LRAD and +2A  for safety with pt assist approx 30%.  PT Short Term Goal 5 - Progress (Week 2): Progressing toward goal Week 3:  PT Short Term Goal 1 (Week 3): =LTG's due to ELOS (downgraded 6/11 due to lack of progress)  Skilled Therapeutic Interventions/Progress Updates:    Pt with difficulty controlling weight shift in frontal plane with little awareness of upright. Pt benefits from cues for weight shift and R sided activation including core, but very difficult to carryover due to R inattention. Family education unable to be performed since family not present. Pt would continue to benefit from skilled PT services to increase functional mobility.   Therapy Documentation Precautions:  Precautions Precautions: Fall Precaution Comments: decreased attention, pusher to the right side, decreased trunk control and posture Restrictions Weight Bearing Restrictions: No Vital Signs: Therapy Vitals Temp: 98 F (36.7 C) Temp Source: Oral Pulse Rate: 79 Resp: 18 BP: 127/83 mmHg Patient Position (if appropriate): Lying Oxygen Therapy SpO2: 94 % O2 Device: Not Delivered Pain: Pain Assessment Pain Assessment: No/denies pain Mobility:  Pt performs transfers with mod A with cues for weight shift and technique Locomotion :    Other Treatments:  Pt educated on rehab plan, safety in mobility, and progressing activity. L/S soft tissue stretch in all planes, T/S soft tissue stretch  into ext with RUE forced use. Reaching to ceiling 2x10 with RUE forced use. Active rest sitting unsupported with RUE forced use. Pt performs Transfers x10 in session. Pt performs T/S AAROM 2x10. Static standing 1'x5. BLE advancement in standing 2x10. RUE forced use with anterior weights shifts. Standing B/L weights shifts 2x10 with RUE forced use.  See FIM for current functional status  Therapy/Group: Individual Therapy  Monia Pouch 07/30/2014, 8:13 AM

## 2014-07-30 NOTE — Progress Notes (Signed)
Patient ID: Michael Goodwin, male   DOB: Nov 22, 1945, 69 y.o.   MRN: [id number]   07/30/14.  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  Hopeful for d/c Tuesday.    Review of Systems - cannot obtain full review secondary to mental status, pt denies pain or breathing issues      Objective: Vital Signs: Blood pressure 127/83, pulse 79, temperature 98 F (36.7 C), temperature source Oral, resp. rate 18, height 6' (1.829 m), weight 156 lb 15.5 oz (71.2 kg), SpO2 94 %. No results found. No results found for this or any previous visit (from the past 72 hour(s)).   HEENT: normal Cardio: RRR and no murmur Resp: CTA B/L and unlabored GI: BS positive and NT, ND Extremity:  Pulses positive and No Edema Skin:   Intact  TED hose in place  Neuro: more alert. R hemiparesis Musc/Skel:  No pain with PROM of limbs today Gen NAD.  Feeding self with L hand  Patient Vitals for the past 24 hrs:  BP Temp Temp src Pulse Resp SpO2  07/30/14 0804 127/83 mmHg - - 79 - -  07/30/14 0517 (!) 95/57 mmHg 98 F (36.7 C) Oral (!) 59 18 94 %  07/29/14 1400 106/79 mmHg 98.2 F (36.8 C) Oral (!) 103 18 100 %     Intake/Output Summary (Last 24 hours) at 07/30/14 0857 Last data filed at 07/30/14 0818  Gross per 24 hour  Intake    780 ml  Output    900 ml  Net   -120 ml     Medical Problem List and Plan: 1. 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 DVT Prophylaxis/Anticoagulation: Subcutaneous heparin for DVT prophylaxis initiated 07/06/2014  3. Dysphagia. Dysphagia 3 thin liquids. Monitor for any signs of aspiration. Follow-up speech therapy.   4.  Hypertension. Lisinopril 20 mg daily, norvasc 10mg ., well controlled 5. Tobacco abuse, counseling when appropriate   LOS (Days) 23 A FACE  TO FACE EVALUATION WAS PERFORMED  Rogelia Boga 07/30/2014, 8:57 AM

## 2014-07-31 ENCOUNTER — Inpatient Hospital Stay (HOSPITAL_COMMUNITY): Payer: Medicare Other | Admitting: Occupational Therapy

## 2014-07-31 ENCOUNTER — Inpatient Hospital Stay (HOSPITAL_COMMUNITY): Payer: Medicare Other | Admitting: Rehabilitation

## 2014-07-31 ENCOUNTER — Inpatient Hospital Stay (HOSPITAL_COMMUNITY): Payer: Medicare Other | Admitting: Speech Pathology

## 2014-07-31 NOTE — Progress Notes (Signed)
Subjective/Complaints:  No complaints this am, pt eating breakfast, no cough with liq Not aware of D/C date  Review of Systems - cannot obtain full review secondary to mental status, pt denies pain or breathing issues   Objective: Vital Signs: Blood pressure 124/64, pulse 61, temperature 98.1 F (36.7 C), temperature source Axillary, resp. rate 18, height 6' (1.829 m), weight 71.2 kg (156 lb 15.5 oz), SpO2 100 %. No results found. No results found for this or any previous visit (from the past 72 hour(s)).   HEENT: normal Cardio: RRR and no murmur Resp: CTA B/L and unlabored GI: BS positive and NT, ND Extremity:  Pulses positive and No Edema Skin:   Intact Neuro: more alert. Disoriented to time. knew he was in hospital only when given choices.3-/5 Right knee ext,3- right hip flexion  2- right grip and  R biceps flexion, 4- in LUE and LLE  Musc/Skel:  No pain with PROM of limbs today Gen NAD.     Assessment/Plan: 1. 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 Planning on D/C in am    FIM: FIM - Bathing Bathing Steps Patient Completed: Chest, Right Arm, Abdomen, Front perineal area, Right upper leg, Left upper leg, Buttocks Bathing: 3: Mod-Patient completes 5-7 15f 10 parts or 50-74%  FIM - Upper Body Dressing/Undressing Upper body dressing/undressing steps patient completed: Put head through opening of pull over shirt/dress, Pull shirt over trunk Upper body dressing/undressing: 3: Mod-Patient completed 50-74% of tasks FIM - Lower Body Dressing/Undressing Lower body dressing/undressing steps patient completed: Thread/unthread left underwear leg, Thread/unthread left pants leg Lower body dressing/undressing: 1: Total-Patient completed less than 25% of tasks  FIM -  Hotel manager Devices: Grab bar or rail for support Toileting: 1: Two helpers  FIM - Diplomatic Services operational officer Devices: Bedside commode (drop arm commode) Toilet Transfers: 3-From toilet/BSC: Mod A (lift or lower assist), 3-To toilet/BSC: Mod A (lift or lower assist)  FIM - Banker Devices: Arm rests Bed/Chair Transfer: 3: Chair or W/C > Bed: Mod A (lift or lower assist)  FIM - Locomotion: Wheelchair Distance: 60 Locomotion: Wheelchair: 2: Travels 50 - 149 ft with maximal assistance (Pt: 25 - 49%) FIM - Locomotion: Ambulation Locomotion: Ambulation Assistive Devices: Other (comment) ("three muskateer style") Ambulation/Gait Assistance: 1: +2 Total assist Locomotion: Ambulation: 1: Two helpers  Comprehension Comprehension Mode: Auditory Comprehension: 2-Understands basic 25 - 49% of the time/requires cueing 51 - 75% of the time  Expression Expression Mode: Verbal Expression: 2-Expresses basic 25 - 49% of the time/requires cueing 50 - 75% of the time. Uses single words/gestures.  Social Interaction Social Interaction Mode: Asleep Social Interaction: 2-Interacts appropriately 25 - 49% of time - Needs frequent redirection.  Problem Solving Problem Solving Mode: Asleep Problem Solving: 2-Solves basic 25 - 49% of the time - needs direction more than half the time to initiate, plan or complete simple activities  Memory Memory Mode: Asleep Memory: 2-Recognizes or recalls 25 - 49% of the time/requires cueing 51 - 75% of the time   Medical Problem List and Plan: 1. 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 DVT Prophylaxis/Anticoagulation: Subcutaneous heparin for DVT prophylaxis initiated 07/06/2014 3. Pain Management: Tylenol as needed 4. Dysphagia. Dysphagia 3 thin liquids. Monitor for any signs  of aspiration. Follow-up speech therapy.  5.  Neuropsych: This patient is not capable of making decisions on his own behalf. 6. Skin/Wound Care: Routine skin checks 7. Fluids/Electrolytes/Nutrition:  Much improved  -fluid intake ok,  8. Hypertension. Lisinopril 20 mg daily, norvasc 10mg ., well controlled 9. Tobacco abuse, counseling when appropriate 10. Sleep-wake: ritalin and remeron for wake/sleep  -sleep chart  -arousal has steadily improved   LOS (Days) 24 A FACE TO FACE EVALUATION WAS PERFORMED  Sedric Guia E 07/31/2014, 7:58 AM

## 2014-07-31 NOTE — Plan of Care (Signed)
Problem: RH Balance Goal: LTG: Patient will maintain dynamic sitting balance (OT) LTG: Patient will maintain dynamic sitting balance with assistance during activities of daily living (OT)  Outcome: Not Met (add Reason) Needs min assist for balance. Goal: LTG Patient will maintain dynamic standing with ADLs (OT) LTG: Patient will maintain dynamic standing balance with assist during activities of daily living (OT)  Outcome: Not Met (add Reason) Needs mod to max assist to maintain balance.  Problem: RH Dressing Goal: LTG Patient will perform lower body dressing w/assist (OT) LTG: Patient will perform lower body dressing with assist, with/without cues in positioning using equipment (OT)  Outcome: Not Met (add Reason) Needs total assist +2 for LB dressing sit to stand.   Problem: RH Toileting Goal: LTG Patient will perform toileting w/assist, cues/equip (OT) LTG: Patient will perform toiletiing (clothes management/hygiene) with assist, with/without cues using equipment (OT)  Outcome: Not Met (add Reason) Needs total assist +2 for sit to stand to manage clothing.  Problem: RH Awareness Goal: LTG: Patient will demonstrate intellectual/emergent (OT) LTG: Patient will demonstrate intellectual/emergent/anticipatory awareness with assist during a functional activity (OT)  Outcome: Not Met (add Reason) Needs mod assist overall for intellectual awareness.

## 2014-07-31 NOTE — Progress Notes (Signed)
Occupational Therapy Session Note  Patient Details  Name: Michael Goodwin MRN: [id number] Date of Birth: 08/22/1945  Today's Date: 07/31/2014 OT Individual Time: 1004-1104 OT Individual Time Calculation (min): 60 min    Short Term Goals: Week 3:  OT Short Term Goal 1 (Week 3): STGs= LTGs secondary to planned upcoming discharge  Skilled Therapeutic Interventions/Progress Updates:    Pt performed bathing and dressing during session sit to stand at EOB.  Pt's daughter, son, and cousin present for session and provided hands on assist for all transfers and selfcare.  Had pt's son transfer pt to the EOB squat pivot with mod assist.  Michael Goodwin was able to maintain sitting with supervision during UB selfcare and min assist for LB selfcare.  Educated pt's son and cousin on hand over hand assist when incorporating the RUE in to bathing.  Also provided wash mit for pt to use at home as well and explained how to donn it.  Pt's cousin assist with all sit to stand for washing peri area and pulling pants over hips.  His son assisted with pulling them over his hips and for washing peri area while cousin helped with standing.  They assisted with LB dressing by helping to cross the RLE over the left knee to help him start his brief, pants, and for donning shoe.  He was able to put his left foot in the pants and underpants and pull up over his knee.  Son and cousin assisted with tying shoes and for squat pivot transfer back to the wheelchair with mod assist.  Once in the chair he stated he needed to use the restroom.  Both men then assisted him with transfer to the toilet but needed max instructional cueing as they were initially trying to stand him to urinate instead of having him sit on the seat.  Pt left on toilet with son and cousin both providing supervision while PT took over session.  Therapy Documentation Precautions:  Precautions Precautions: Fall Precaution Comments: decreased attention, pusher to the right  side, decreased trunk control and posture Restrictions Weight Bearing Restrictions: No  Pain: Pain Assessment Pain Assessment: No/denies pain Pain Score: 0-No pain ADL: See FIM for current functional status  Therapy/Group: Individual Therapy  Nihar Klus OTR/L 07/31/2014, 12:50 PM

## 2014-07-31 NOTE — Progress Notes (Signed)
Physical Therapy Discharge Summary  Patient Details  Name: Michael Goodwin MRN: [id number] Date of Birth: 07-10-1945  Today's Date: 07/31/2014 PT Individual Time: 1105-1200 PT Individual Time Calculation (min): 55 min    Patient has met 3 of 7 long term goals due to improved activity tolerance, improved balance, improved postural control, increased strength, ability to compensate for deficits, functional use of  right lower extremity, improved attention and improved awareness.  Patient to discharge at a wheelchair level [place].   Patient's care partner is independent to provide the necessary physical and cognitive assistance at discharge.  Reasons goals not met: Pt unable to meet car transfer and w/c propulsion or gait goals set by therapy as he continues to require mod A transfers, +2A for gait.   Recommendation:  Patient will benefit from ongoing skilled PT services in home health setting to continue to advance safe functional mobility, address ongoing impairments in decreased balance, poor awareness, decreased attention, decreased functional use of RUE/LE, poor sensation/prioception, and minimize fall risk.  Equipment: w/c, specialty cushion and back  Reasons for discharge: treatment goals met and discharge from hospital  Patient/family agrees with progress made and goals achieved: Yes   PT treatment/intervention:  Skilled session focused on grad day activities and ensuring pt has met all goals for safe D/C home tomorrow.  Son and cousin assisting pt to/from toilet in restroom upon PT entering with OT, PT taking over to provide S and cues for safety.  Pt returned to w/c and had pt propel w/c x 150' to ADL apt with LUE/LE at min A level with continued demonstration and tactile cues for technique with increased difficulty performing together.  Performed all squat pivot transfers w/c<>bed and w/c<>furniture at mod A level with good ability to forward weight shift during task.  Performed bed  mobility at min A level with assist for RLE.  Ended session with two bouts of gait x 45' via L HHA and R arm around PT with facilitation for forward weight shift, stabilization over RLE, upright posture and placement of RLE.  Good understanding of needing to ensure R LE stabilized before advancing LLE.  Also performed stairs x 4, 6" with L handrail in step to fashion.  Requires max A with assist for placement of RLE to/from step, stabilization of RLE and upright posture.  Tolerated well.  Pt propelled back to room as above.  Left at nursing station with quick release belt donned and RN aware.   PT Discharge Precautions/Restrictions Precautions Precautions: Fall Precaution Comments: decreased attention, pusher to the right side, decreased trunk control and posture Restrictions Weight Bearing Restrictions: No Vital Signs   Pain Pain Assessment Pain Assessment: No/denies pain Pain Score: 0-No pain Vision/Perception     Cognition Overall Cognitive Status: Impaired/Different from baseline Arousal/Alertness: Lethargic Orientation Level: Oriented to place;Oriented to person;Oriented to situation (with cues ) Attention: Sustained Sustained Attention: Impaired Sustained Attention Impairment: Verbal basic;Functional basic Memory: Impaired Memory Impairment: Storage deficit;Retrieval deficit;Decreased recall of new information;Decreased short term memory Decreased Short Term Memory: Verbal basic;Functional basic Awareness: Impaired Awareness Impairment: Intellectual impairment Problem Solving: Impaired Problem Solving Impairment: Verbal basic;Functional basic Executive Function:  (all impaired due to lower level deficits) Behaviors: Impulsive;Restless Safety/Judgment: Impaired Comments: right inattention, decreased awareness of deficits, fluctuating orientation and alertness   Sensation Sensation Light Touch: Impaired Detail Light Touch Impaired Details: Absent RUE;Absent  RLE Proprioception: Impaired by gross assessment Coordination Gross Motor Movements are Fluid and Coordinated: No Fine Motor Movements are Fluid and Coordinated:  No Coordination and Movement Description: Pt with decreased strength and coordination in RUE/LE  Heel Shin Test: unable to perform due to strength deficits.  Motor  Motor Motor: Hemiplegia;Abnormal tone;Abnormal postural alignment and control Motor - Discharge Observations: R hemiplegia, increased flexor tone, decreased balance and postural control.   Mobility Bed Mobility Bed Mobility: Supine to Sit;Sit to Supine Supine to Sit: 4: Min assist Supine to Sit Details: Verbal cues for sequencing;Verbal cues for technique;Verbal cues for precautions/safety;Manual facilitation for weight shifting;Manual facilitation for weight bearing;Manual facilitation for placement Sit to Supine: 4: Min assist Sit to Supine - Details: Verbal cues for sequencing;Verbal cues for technique;Verbal cues for precautions/safety;Manual facilitation for weight shifting;Manual facilitation for placement;Manual facilitation for weight bearing Transfers Transfers: Yes Sit to Stand: 3: Mod assist Sit to Stand Details: Verbal cues for sequencing;Verbal cues for technique;Verbal cues for precautions/safety;Manual facilitation for weight shifting;Manual facilitation for weight bearing Stand to Sit: 3: Mod assist Stand to Sit Details (indicate cue type and reason): Verbal cues for sequencing;Verbal cues for technique;Verbal cues for precautions/safety;Manual facilitation for weight shifting;Manual facilitation for weight bearing Squat Pivot Transfers: 3: Mod assist Squat Pivot Transfer Details: Verbal cues for sequencing;Verbal cues for technique;Verbal cues for precautions/safety;Manual facilitation for weight shifting;Manual facilitation for weight bearing;Verbal cues for safe use of DME/AE Locomotion  Ambulation Ambulation: Yes Ambulation/Gait Assistance: 1: +2  Total assist Ambulation Distance (Feet): 45 Feet Assistive device: 2 person hand held assist Ambulation/Gait Assistance Details: Verbal cues for sequencing;Verbal cues for technique;Verbal cues for precautions/safety;Manual facilitation for weight shifting;Manual facilitation for weight bearing;Verbal cues for gait pattern;Manual facilitation for placement;Verbal cues for safe use of DME/AE Gait Gait: Yes Gait Pattern: Impaired Gait Pattern: Step-to pattern;Step-through pattern;Decreased stride length;Decreased dorsiflexion - right;Decreased weight shift to left;Right flexed knee in stance;Poor foot clearance - right;Trunk rotated posteriorly on left;Trunk flexed Stairs / Additional Locomotion Stairs: Yes Stairs Assistance: 2: Max Industrial/product designer Assistance Details: Verbal cues for sequencing;Verbal cues for technique;Verbal cues for precautions/safety;Manual facilitation for weight shifting;Verbal cues for gait pattern;Verbal cues for safe use of DME/AE;Manual facilitation for placement;Manual facilitation for weight bearing Stair Management Technique: One rail Left;Step to pattern;Forwards Number of Stairs: 4 Height of Stairs: 6 Wheelchair Mobility Wheelchair Mobility: Yes Wheelchair Assistance: 4: Advertising account executive Details: Verbal cues for sequencing;Verbal cues for technique;Verbal cues for precautions/safety;Tactile cues for initiation;Tactile cues for sequencing;Tactile cues for placement Wheelchair Propulsion: Left upper extremity;Left lower extremity Wheelchair Parts Management: Needs assistance Distance: 100  Trunk/Postural Assessment  Cervical Assessment Cervical Assessment: Exceptions to [facility] Cervical Strength Overall Cervical Strength Comments: Pt with forward flexed head, slightly turned to the L Thoracic Assessment Thoracic Assessment: Exceptions to [facility] Thoracic Strength Overall Thoracic Strength Comments: forward/rounded shoulders Lumbar Assessment Lumbar  Assessment: Exceptions to [facility] Lumbar Strength Overall Lumbar Strength Comments: increased posterior pelvic tilt during sitting and standing.  Postural Control Postural Control: Deficits on evaluation Trunk Control: improved trunk control in sitting, however continues to push, esp when fatigued to the R.  Righting Reactions: Patient unable to right self. Has LOB to the R  Balance Balance Balance Assessed: Yes Static Sitting Balance Static Sitting - Balance Support: Feet supported Static Sitting - Level of Assistance: 5: Stand by assistance Dynamic Sitting Balance Dynamic Sitting - Balance Support: Feet supported Dynamic Sitting - Level of Assistance: 4: Min assist Static Standing Balance Static Standing - Balance Support: During functional activity Static Standing - Level of Assistance: 3: Mod assist Dynamic Standing Balance Dynamic Standing - Balance Support: During functional activity Dynamic Standing -  Level of Assistance: 2: Max assist Extremity Assessment    LUE Assessment LUE Assessment: Within Functional Limits RLE Assessment RLE Assessment: Exceptions to [facility] RLE Strength RLE Overall Strength: Deficits Right Hip Flexion: 2/5 Right Knee Flexion: 2/5 Right Knee Extension: 3-/5 Right Ankle Dorsiflexion: 1/5 LLE Assessment LLE Assessment: Within Functional Limits  See FIM for current functional status  Denice Bors 07/31/2014, 12:52 PM

## 2014-07-31 NOTE — Progress Notes (Signed)
Speech Language Pathology Discharge Summary  Patient Details  Name: Michael Goodwin MRN: [id number] Date of Birth: [birth year]  Today's Date: 07/31/2014 SLP Individual Time: 0800-0900; 1017-5102 SLP Individual Time Calculation (min): 60 min; 28 min   Skilled Therapeutic Interventions:   Session 1:  Pt was seen for skilled ST targeting cognitive goals. Upon arrival, pt was seated upright in bed, awake, alert, and required max encouragement to participate in [place].  Pt was eventually agreeable to getting out of bed for therapies when reminded that he was leaving to go home tomorrow and that today was his last day of therapies.  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  Pt scored 7 out of 22.  The only points that pt was awarded was for the orientation subtest as he presented with significant deficits across all other domains including attention, abstraction, and immediate and delayed recall.  Pt was more alert today in comparison to previous therapy sessions and was more appropriate and intelligible in his responses to SLP's questions.  He remains highly distractible by both internal and external stimuli and was able to sustain his attention for ~1 minute intervals before requiring cues for redirection.  Pt's son was present at the end of today's session.  SLP reviewed and reinforced recommendations addressed in previous family education sessions.  Pt's son had no new questions at this point.  Pt left upright in chair with quick release belt donned and son in room.   Session 2:  Pt was seen for skilled ST targeting dysphagia goals.  SLP facilitated the session with a trial snack of dys 2 textures.  Pt demonstrated slowed but effective mastication of advanced textures with minimal, diffuse residual solids left in the oral cavity post swallow.  Pt was somewhat lethargic, but was able to achieve adequate  alertness for safe swallowing during snack.  No overt s/s of aspiration was evident with trials.  Suspect pt may be able to tolerate a diet upgrade to dys 2 textures but will defer upgrade to ST at next level of care.     Patient has met 6 of 9 long term goals.  Patient to discharge at overall Mod level.  Reasons goals not met: pt continues to present with decreased sustained attention to tasks and is discharging with 24/7 supervision from family to facilitate recall of daily information    Clinical Impression/Discharge Summary:  Pt made slow, functional gains while inpatient and is discharging have met 6 out of 9 long term goals.  Currently, pt requires mod assist for basic, familiar cognitive tasks due to decreased recall of new information, decreased initiation, impulsivity, decreased sustained attention to tasks, decreased functional problem solving, poor intellectual awareness of deficits, and right inattention.  Pt is currently consuming dys 1 textures with nectar thick liquids due to a primarily cognitively based dysphagia and he requires overall min-mod cues for use of swallowing precautions.  Recommend objective swallowing study once mentation improves prior to liquids advancement.  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  Pt is discharging home with 24/7 supervision from family and home health ST follow up.  Pt and family education is complete at this time.    Care Partner:  Caregiver Able to Provide Assistance: Yes  Type of Caregiver Assistance: Physical;Cognitive  Recommendation:  Home  Health SLP;24 hour supervision/assistance  Rationale for SLP Follow Up: Maximize functional communication;Maximize swallowing safety;Maximize cognitive function and independence;Reduce caregiver burden   Equipment: thickener   Reasons for discharge: Discharged from hospital    Patient/Family Agrees with Progress Made and Goals Achieved: Yes   See FIM for current functional status  PageSelinda Orion 07/31/2014, 7:49 AM

## 2014-07-31 NOTE — Plan of Care (Signed)
Problem: RH Ambulation Goal: LTG Patient will ambulate in controlled environment (PT) LTG: Patient will ambulate in a controlled environment, # of feet with assistance (PT).  Outcome: Not Met (add Reason) Requires +2A for gait.   Problem: RH Car Transfers Goal: LTG Patient will perform car transfers with assist (PT) LTG: Patient will perform car transfers with assistance (PT).  Outcome: Not Met (add Reason) Requires mod A for car transfer.   Problem: RH Furniture Transfers Goal: LTG Patient will perform furniture transfers w/assist (OT/PT LTG: Patient will perform furniture transfers with assistance (OT/PT).  Outcome: Not Met (add Reason) Requires mod A for transfer  Problem: RH Wheelchair Mobility Goal: LTG Patient will propel w/c in home environment (PT) LTG: Patient will propel wheelchair in home environment, # of feet with assistance (PT).  Outcome: Not Met (add Reason) Requires min A

## 2014-07-31 NOTE — Plan of Care (Signed)
Problem: RH Attention Goal: LTG Patient will demonstrate focused/sustained (SLP) LTG: Patient will demonstrate focused/sustained/selective/alternating/divided attention during cognitive/linguistic activities in specific environment with assist for # of minutes (SLP)  Outcome: Not Met (add Reason) Pt can sustain attention to tasks for 30 seconds to 1 minute with overall mod assist for redirection

## 2014-07-31 NOTE — Progress Notes (Signed)
Occupational Therapy Discharge Summary  Patient Details  Name: Michael Goodwin MRN: [id number] Date of Birth: [birth year]  Today's Date: 07/31/2014 OT Individual Time: 7672-0947 OT Individual Time Calculation (min): 21 min   Session Note:  Pt transferred stand pivot from wheelchair to mat.  He needed max assist stand pivot to complete transfer as he could not advance the RLE on carpeted.  Increased tone also noted in the right hamstrings.  Pt was able to sit on therapy mat with supervision.  Looked further at pt's sensation in the LUE as well as functional movement and sensation.  Also looked at postural alignment as well as vision.  See d/c eval for details.  Pt transferred back to the wheelchair to conclude session with max assist.    Patient has met 6 of 9 long term goals due to improved activity tolerance, improved balance, postural control, ability to compensate for deficits, functional use of  RIGHT upper and RIGHT lower extremity, improved attention, improved awareness and improved coordination.  Patient to discharge at overall Mod Assist level.  Patient's care partner is independent to provide the necessary physical and cognitive assistance at discharge.    Reasons goals not met: Pt continues to need total assist +2 for toileting tasks and for LB dressing sit to stand.  Dynamic standing balance during selfcare tasks also remains at a mod to max assist level.    Recommendation:  Patient will benefit from ongoing skilled OT services in home health setting to continue to advance functional skills in the area of BADL.  Pt has made good progress with OT but still is limited secondary to awareness, attention, RUE and RLE hemiparesis.  Feel he will need 24 hour mod assist at discharge and continued [place].   Equipment: drop arm commode  Reasons for discharge: treatment goals met and discharge from hospital  Patient/family agrees with progress made and goals achieved: Yes  OT  Discharge Precautions/Restrictions  Precautions Precautions: Fall Precaution Comments: decreased attention, pusher to the right side, decreased trunk control and posture Restrictions Weight Bearing Restrictions: No  Pain Pain Assessment Pain Assessment: No/denies pain Pain Score: 0-No pain ADL  See FIM scale for details  Vision/Perception  Vision- History Baseline Vision/History: Wears glasses Wears Glasses: Reading only Patient Visual Report: No change from baseline Vision- Assessment Vision Assessment?: Vision impaired- to be further tested in functional context Eye Alignment: Impaired (comment) Ocular Range of Motion: Restricted looking up;Restricted looking down;Restricted on the left;Restricted on the right Alignment/Gaze Preference: Chin down;Head turned Tracking/Visual Pursuits: Decreased smoothness of horizontal tracking;Other (comment) (He was able to scan both left and right across midline but did not exhibit any superior or inferior occular AROM in either eye. ) Visual Fields: No apparent deficits  Cognition Overall Cognitive Status: Impaired/Different from baseline Arousal/Alertness: Awake/alert Orientation Level: Oriented to place;Oriented to person;Oriented to situation Attention: Sustained Sustained Attention: Impaired Sustained Attention Impairment: Verbal basic;Functional basic Memory: Impaired Memory Impairment: Storage deficit;Retrieval deficit;Decreased recall of new information;Decreased short term memory Decreased Short Term Memory: Verbal basic;Functional basic Awareness: Impaired Awareness Impairment: Intellectual impairment Problem Solving: Impaired Problem Solving Impairment: Verbal basic;Functional basic Executive Function:  (all impaired due to lower level deficits) Behaviors: Impulsive;Restless Safety/Judgment: Impaired Comments: Pt still with decreased carryover of techniques related to bathing and dressing.  Still with right inattention and  right sensory loss in the UE and LE.  Sensation Sensation Light Touch: Impaired Detail Light Touch Impaired Details: Absent RUE;Absent RLE Stereognosis: Impaired Detail Stereognosis Impaired Details: Impaired RUE Hot/Cold: Not  tested Proprioception: Impaired Detail Proprioception Impaired Details: Impaired RUE Coordination Gross Motor Movements are Fluid and Coordinated: No Fine Motor Movements are Fluid and Coordinated: No Coordination and Movement Description: Pt currently attempts functional use of the LUE with selfcare sessions but needs max assist.  increased flexor tone in the elbow and digits limit functional ability as well as pt not having any sensory recognition in the arm.   Heel Shin Test: unable to perform due to strength deficits.  Motor  Motor Motor: Hemiplegia;Abnormal tone;Abnormal postural alignment and control Motor - Discharge Observations: R hemiplegia, increased flexor tone, decreased balance and postural control.  Mobility  Bed Mobility Bed Mobility: Supine to Sit;Sit to Supine Supine to Sit: 4: Min assist Supine to Sit Details: Verbal cues for sequencing;Verbal cues for technique;Verbal cues for precautions/safety;Manual facilitation for weight shifting;Manual facilitation for weight bearing;Manual facilitation for placement Sit to Supine: 4: Min assist Sit to Supine - Details: Verbal cues for sequencing;Verbal cues for technique;Verbal cues for precautions/safety;Manual facilitation for weight shifting;Manual facilitation for placement;Manual facilitation for weight bearing Transfers Transfers: Sit to Stand Sit to Stand: 3: Mod assist Sit to Stand Details: Verbal cues for sequencing;Verbal cues for technique;Verbal cues for precautions/safety;Manual facilitation for weight shifting;Manual facilitation for weight bearing Sit to Stand Details (indicate cue type and reason): Increased flexor tone noted in the right hamstring. Stand to Sit: 3: Mod assist Stand to  Sit Details (indicate cue type and reason): Verbal cues for sequencing;Verbal cues for technique;Verbal cues for precautions/safety;Manual facilitation for weight shifting;Manual facilitation for weight bearing  Trunk/Postural Assessment  Cervical Assessment Cervical Assessment: Exceptions to [facility] Cervical Strength Overall Cervical Strength Comments: pt with cervical flexion and protraction as well as slight rotation to the left.  Thoracic Assessment Thoracic Assessment: Exceptions to [facility] Thoracic Strength Overall Thoracic Strength Comments: forward/rounded shoulders, slight thoracic kyphosis Lumbar Assessment Lumbar Assessment: Exceptions to [facility] Lumbar Strength Overall Lumbar Strength Comments: Pt with increased posterior pelvic tilt in sitting.  Decreased pelvic mobility also noted to achieve and maintain anterior pelvic tilt Postural Control Postural Control: Deficits on evaluation Trunk Control: improved trunk control in sitting, however continues to push, esp when fatigued to the R.  Righting Reactions: Patient unable to right self. Has LOB to the R and posteriorly during selfcare tasks.  Balance Balance Balance Assessed: Yes Static Sitting Balance Static Sitting - Balance Support: Feet supported Static Sitting - Level of Assistance: 5: Stand by assistance Dynamic Sitting Balance Dynamic Sitting - Balance Support: Feet supported Dynamic Sitting - Level of Assistance: 4: Min assist Static Standing Balance Static Standing - Balance Support: During functional activity Static Standing - Level of Assistance: 3: Mod assist Dynamic Standing Balance Dynamic Standing - Balance Support: During functional activity Dynamic Standing - Level of Assistance: 2: Max assist Extremity/Trunk Assessment RUE Assessment RUE Assessment: Exceptions to [facility] RUE Strength RUE Overall Strength Comments: Pt demonstrates increased flexor tone in the right arm, hand and shoulder.  Increased inferior shoulder  pain with attempted AAROM greater than 90 degrees.  He demonstrates Brunnstrum stage IV movement in the arm and hand with 75%of gross digit flexion and extension present.  Tone in right elbow is a 3/5 on the Ashworth scale.     RUE Tone RUE Tone: Hypertonic;Modified Ashworth Modified Ashworth Scale for Grading Hypertonia RUE: Considerable increase in muschle tone, passive movement difficult LUE Assessment LUE Assessment: Within Functional Limits  See FIM for current functional status  Chellsea Beckers OTR/L 07/31/2014, 4:30 PM

## 2014-07-31 NOTE — Discharge Summary (Signed)
Michael Goodwin, QUAM                ACCOUNT NO.:  [id number]  MEDICAL RECORD NO.:  [id number]  LOCATION:  [id number]                        FACILITY:  [facility]  PHYSICIAN:  Erick Colace, M.D.DATE OF BIRTH:  01/26/1946  DATE OF ADMISSION:  07/07/2014 DATE OF DISCHARGE:  08/01/2014                              DISCHARGE SUMMARY   DISCHARGE DIAGNOSES: 1. 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 2. Subcutaneous heparin for deep vein thrombosis prophylaxis. 3. Dysphagia. 4. Hypertension. 5. Tobacco abuse. 6. Insomnia.  HISTORY OF PRESENT ILLNESS:  This is a 69 year old right-handed male with history of tobacco abuse, hypertension on no prescription medications who lived alone independent prior to admission.  Presented Jul 04, 2014, with right-sided weakness and altered mental status.  CT of the head showed a left ICH with intraventricular extension.  Blood pressure 173/103 on admission.  Carotid Dopplers with no ICA stenosis. Echocardiogram with ejection fraction of 60%.  No wall motion abnormalities.  Followup cranial CT scan showed evolving left thalamic hematoma, 4-mm left-to-right midline shift.  Subcutaneous heparin initiated for DVT prophylaxis on Jul 06, 2014.  On Jul 07, 2014, question somnolence, altered mental status, Followup cranial CT scan unchanged.  The patient on a mechanical soft diet.  Physical and occupational therapy ongoing.  The patient was admitted for a comprehensive rehab program.  PAST MEDICAL HISTORY:  See discharge diagnoses.  SOCIAL HISTORY:  He lives alone, independent prior to admission.  FUNCTIONAL STATUS:  Upon admission to rehab service was +2 physical assist sit to stand, +2 physical assist sit to supine, mod to max assist for activities of daily living.  PHYSICAL EXAMINATION:  VITAL SIGNS:  Blood pressure 150/85, pulse 56, temperature 98, respirations 18. GENERAL:  This was an  alert male, left gaze preference, severe dysarthria.  Followed simple commands. LUNGS:  Clear to auscultation. CARDIAC:  Regular rate and rhythm. ABDOMEN:  Soft, nontender.  Good bowel sounds.  REHABILITATION HOSPITAL COURSE:  The patient was admitted to inpatient rehab services with therapies initiated on a 3-hour daily basis consisting of physical therapy, occupational therapy, speech therapy, and rehabilitation nursing.  The following issues were addressed during the patient's rehabilitation stay.  Pertaining to Mr. Tugwell' left thalamic intracranial hemorrhage remained stable, he would follow up with Neurology Services.  Subcutaneous heparin has been initiated for DVT prophylaxis on Jul 06, 2014, with no bleeding episodes.  He was followed by Speech Therapy for dysphagia, currently on a dysphagia #1 nectar thick diet with no signs of aspiration.  Blood pressures monitored, controlled, maintained on lisinopril 10 mg daily.  He would follow up with his primary MD.  Ritalin was initiated to help the patient obtain better focus to tasks with good results as well as maintained on Remeron at bedtime.  He did have a history of tobacco abuse.  The patient and family received full counseling in regard to cessation of nicotine products.  It was questionable if he would be compliant with these request.  The patient received weekly collaborative interdisciplinary team conferences to discuss estimated length of stay, family teaching, and any barriers to discharge.  Session was focused on  family education.  Assisting with car transfers.  Needed max cues to attend to right lower extremity.  Transferred back to wheelchair at same level with cues and education with family.  Therapies at sometimes were limited due decrease in attention.  Transferred moderate assist, stand pivot transfers.  Still needing assistance for activities of daily living due to inattention, decreased awareness.  Ongoing  discussions with held with family with possible need for skilled nursing facility versus discharged to home.  Family had requested full teaching and discharged to home.  Therapies ongoing and arranged, discharge taking place on August 01, 2014.  DISCHARGE MEDICATIONS: 1. Norvasc 5 mg daily. 2. Lisinopril 10 mg p.o. daily. 3. Ritalin 10 mg p.o. b.i.d. 4. Remeron 15 mg p.o. at bedtime.   DIET:  His diet was a dysphagia 1 nectar liquids.  FOLLOWUP:  He would follow up with Dr. Claudette Laws at the outpatient rehab service office on August 28, 2014; Dr. Roda Shutters, Neurology Services, call for appointment in 1 month; Dr. Merlinda Frederick on  August 30, 2014, ongoing medical management.     Mariam Dollar, P.A.   ______________________________ Erick Colace, M.D.    DA/MEDQ  D:  07/31/2014  T:  07/31/2014  Job:  [id number]  cc:   Marvel Plan, MD

## 2014-07-31 NOTE — Discharge Summary (Signed)
Discharge summary job # [id number]

## 2014-08-01 ENCOUNTER — Inpatient Hospital Stay (HOSPITAL_COMMUNITY): Payer: Medicare Other | Admitting: Physical Therapy

## 2014-08-01 ENCOUNTER — Inpatient Hospital Stay (HOSPITAL_COMMUNITY): Payer: Medicare Other

## 2014-08-01 MED ORDER — RESOURCE THICKENUP CLEAR PO POWD: 1.0000 | ORAL | Status: DC | PRN

## 2014-08-01 MED ORDER — LISINOPRIL 10 MG PO TABS: 10.0000 mg | ORAL_TABLET | Freq: Every day | ORAL | Status: DC

## 2014-08-01 MED ORDER — METHYLPHENIDATE HCL 10 MG PO TABS: 10.0000 mg | ORAL_TABLET | Freq: Two times a day (BID) | ORAL | Status: DC

## 2014-08-01 MED ORDER — MIRTAZAPINE 15 MG PO TABS: 15.0000 mg | ORAL_TABLET | Freq: Every day | ORAL | Status: DC

## 2014-08-01 MED ORDER — AMLODIPINE BESYLATE 5 MG PO TABS: 5.0000 mg | ORAL_TABLET | Freq: Every day | ORAL | Status: DC

## 2014-08-01 NOTE — Progress Notes (Signed)
Subjective/Complaints:  Pt aware of D/C today "I feel good"    Objective: Vital Signs: Blood pressure 118/73, pulse 72, temperature 98 F (36.7 C), temperature source Oral, resp. rate 18, height 6' (1.829 m), weight 71.2 kg (156 lb 15.5 oz), SpO2 97 %. No results found. No results found for this or any previous visit (from the past 72 hour(s)).   HEENT: normal Cardio: RRR and no murmur Resp: CTA B/L and unlabored GI: BS positive and NT, ND Extremity:  Pulses positive and No Edema Skin:   Intact Sup to sit with sup Musc/Skel:  No pain with PROM of limbs today Gen NAD.     Assessment/Plan: 1. Functional deficits secondary to  left thalamic intracranial hemorrhage with R hemiparesis and R hemisensory deficits  Stable for D/C today F/u PCP in 1-2 weeks F/u PM&R 4-6 weeks See D/C summary See D/C instructions   FIM: FIM - Bathing Bathing Steps Patient Completed: Chest, Right Arm, Abdomen, Front perineal area, Right upper leg, Left upper leg, Left lower leg (including foot) Bathing: 3: Mod-Patient completes 5-7 38f 10 parts or 50-74%  FIM - Upper Body Dressing/Undressing Upper body dressing/undressing steps patient completed: Put head through opening of pull over shirt/dress, Pull shirt over trunk, Thread/unthread left sleeve of pullover shirt/dress Upper body dressing/undressing: 4: Min-Patient completed 75 plus % of tasks FIM - Lower Body Dressing/Undressing Lower body dressing/undressing steps patient completed: Thread/unthread left underwear leg, Thread/unthread right pants leg Lower body dressing/undressing: 2: Max-Patient completed 25-49% of tasks  FIM - Toileting Toileting steps completed by patient: Adjust clothing prior to toileting, Performs perineal hygiene, Adjust clothing after toileting Toileting Assistive Devices: Grab bar or rail for support Toileting: 1: Two helpers  FIM - Diplomatic Services operational officer Devices: Bedside commode (drop arm  commode) Toilet Transfers: 3-From toilet/BSC: Mod A (lift or lower assist), 3-To toilet/BSC: Mod A (lift or lower assist)  FIM - Banker Devices: Arm rests Bed/Chair Transfer: 4: Supine > Sit: Min A (steadying Pt. > 75%/lift 1 leg), 4: Sit > Supine: Min A (steadying pt. > 75%/lift 1 leg), 3: Bed > Chair or W/C: Mod A (lift or lower assist), 3: Chair or W/C > Bed: Mod A (lift or lower assist)  FIM - Locomotion: Wheelchair Distance: 100 Locomotion: Wheelchair: 2: Travels 50 - 149 ft with minimal assistance (Pt.>75%) FIM - Locomotion: Ambulation Locomotion: Ambulation Assistive Devices: Other (comment) ("three muskateer style") Ambulation/Gait Assistance: 1: +2 Total assist Locomotion: Ambulation: 1: Two helpers  Comprehension Comprehension Mode: Auditory Comprehension: 3-Understands basic 50 - 74% of the time/requires cueing 25 - 50%  of the time  Expression Expression Mode: Verbal Expression: 3-Expresses basic 50 - 74% of the time/requires cueing 25 - 50% of the time. Needs to repeat parts of sentences.  Social Interaction Social Interaction Mode: Asleep Social Interaction: 3-Interacts appropriately 50 - 74% of the time - May be physically or verbally inappropriate.  Problem Solving Problem Solving Mode: Asleep Problem Solving: 3-Solves basic 50 - 74% of the time/requires cueing 25 - 49% of the time  Memory Memory Mode: Asleep Memory: 3-Recognizes or recalls 50 - 74% of the time/requires cueing 25 - 49% of the time   Medical Problem List and Plan: 1. 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 DVT Prophylaxis/Anticoagulation: Subcutaneous heparin for DVT prophylaxis initiated 07/06/2014 3. Pain Management: Tylenol as needed 4. Dysphagia. Dysphagia 3 thin liquids. Monitor for any signs of aspiration. Follow-up  speech therapy.  5. Neuropsych: This patient is not capable of  making decisions on his own behalf. 6. Skin/Wound Care: Routine skin checks 7. Fluids/Electrolytes/Nutrition:   improved  -fluid intake ok,  8. Hypertension. Lisinopril 20 mg daily, norvasc ., well controlled 9. Tobacco abuse, counseling when appropriate 10. Sleep-wake: ritalin and remeron for wake/sleep- will cont ritalin for at least one more month  -sleep chart  -arousal has steadily improved   LOS (Days) 25 A FACE TO FACE EVALUATION WAS PERFORMED  Michael Goodwin E 08/01/2014, 7:38 AM

## 2014-08-01 NOTE — Progress Notes (Signed)
Late entry: Pt discharged home with family. Discharge instructions provided by Harvel Ricks, PA. All questions answered, family verbalized understanding. Pt escorted off unit in w/c with personal belonging by Darin Engels, NT.

## 2014-08-01 NOTE — Progress Notes (Signed)
Social Work Discharge Note  The overall goal for the admission was met for:   Discharge location: Yes - to his home with family there 24/7  Length of Stay: Yes - 25 days   Discharge activity level: No - moderate (sometimes max) assistance  Home/community participation: Yes  Services provided included: MD, RD, PT, OT, SLP, RN, Pharmacy and SW  Financial Services: Medicare  Follow-up services arranged: Home Health: PT/OT/ST/RN, DME: drop arm commode from [place] and 16"x16" w/c with contoured back and contoured cushion with pelvic positioning belt and Patient/Family has no preference for DME agencies.  Patient/Family wants [facility] for home health.  Comments (or additional information):  Pt is going to his home at d/c where his dtr, son, and family friend plan to care for him 24/7.  Dtr reported pt will often times have 2 caregivers at a time.  Pt will have home health and has received necessary DME.  Signed handicap placard application was given to family.  CSW established pt with a primary care PA in [place].  Patient/Family verbalized understanding of follow-up arrangements: Yes  Individual responsible for coordination of the follow-up plan:  Pt's dtr, son, family friend  Confirmed correct DME delivered: Trey Sailors 08/01/2014    Lacey Dotson, Silvestre Mesi

## 2014-08-01 NOTE — Discharge Instructions (Signed)
Inpatient Rehab Discharge Instructions  Michael Goodwin Discharge date and time: No discharge date for patient encounter.   Activities/Precautions/ Functional Status: Activity: activity as tolerated Diet:  Wound Care: none needed Functional status:  ___ No restrictions     ___ Walk up steps independently ___ 24/7 supervision/assistance   ___ Walk up steps with assistance ___ Intermittent supervision/assistance  ___ Bathe/dress independently ___ Walk with walker     ___ Bathe/dress with assistance ___ Walk Independently    ___ Shower independently _x STROKE/TIA DISCHARGE INSTRUCTIONS SMOKING Cigarette smoking nearly doubles your risk of having a stroke & is the single most alterable risk factor  If you smoke or have smoked in the last 12 months, you are advised to quit smoking for your health.  Most of the excess cardiovascular risk related to smoking disappears within a year of stopping.  Ask you doctor about anti-smoking medications  [place] Quit Line: 1-800-QUIT NOW  Free Smoking Cessation Classes [phone]  CHOLESTEROL Know your levels; limit fat & cholesterol in your diet  Lipid Panel     Component Value Date/Time   CHOL 162 07/05/2014 0855   TRIG 62 07/05/2014 0855   HDL 95 07/05/2014 0855   CHOLHDL 1.7 07/05/2014 0855   VLDL 12 07/05/2014 0855   LDLCALC 55 07/05/2014 0855      Many patients benefit from treatment even if their cholesterol is at goal.  Goal: Total Cholesterol (CHOL) less than 160  Goal:  Triglycerides (TRIG) less than 150  Goal:  HDL greater than 40  Goal:  LDL (LDLCALC) less than 100   BLOOD PRESSURE American Stroke Association blood pressure target is less that 120/80 mm/Hg  Your discharge blood pressure is:  BP: 106/71 mmHg  Monitor your blood pressure  Limit your salt and alcohol intake  Many individuals will require more than one medication for high blood pressure  DIABETES (A1c is a blood sugar average for last 3 months) Goal HGBA1c is  under 7% (HBGA1c is blood sugar average for last 3 months)  Diabetes: No known diagnosis of diabetes    Lab Results  Component Value Date   HGBA1C 5.8* 07/05/2014     Your HGBA1c can be lowered with medications, healthy diet, and exercise.  Check your blood sugar as directed by your physician  Call your physician if you experience unexplained or low blood sugars.  PHYSICAL ACTIVITY/REHABILITATION Goal is 30 minutes at least 4 days per week  Activity: Increase activity slowly, Therapies: Physical Therapy: Home Health Return to work:   Activity decreases your risk of heart attack and stroke and makes your heart stronger.  It helps control your weight and blood pressure; helps you relax and can improve your mood.  Participate in a regular exercise program.  Talk with your doctor about the best form of exercise for you (dancing, walking, swimming, cycling).  DIET/WEIGHT Goal is to maintain a healthy weight  Your discharge diet is: DIET - DYS 1 Room service appropriate?: Yes; Fluid consistency:: Nectar Thick  liquids Your height is:  Height: 6' (182.9 cm) Your current weight is: Weight: 70.1 kg (154 lb 8.7 oz) Your Body Mass Index (BMI) is:     Following the type of diet specifically designed for you will help prevent another stroke.  Your goal weight range is:    Your goal Body Mass Index (BMI) is 19-24.  Healthy food habits can help reduce 3 risk factors for stroke:  High cholesterol, hypertension, and excess weight.  RESOURCES Stroke/Support  Group:  Call [phone]   STROKE EDUCATION PROVIDED/REVIEWED AND GIVEN TO PATIENT Stroke warning signs and symptoms How to activate emergency medical system (call 911). Medications prescribed at discharge. Need for follow-up after discharge. Personal risk factors for stroke. Pneumonia vaccine given:  Flu vaccine given:  My questions have been answered, the writing is legible, and I understand these instructions.  I will adhere to these  goals & educational materials that have been provided to me after my discharge from the hospital.    __ Walk with assistance    ___ Shower with assistance ___ No alcohol     ___ Return to work/school ________  COMMUNITY REFERRALS UPON DISCHARGE:   Home Health:   PT     OT     ST    RN     Agency:  [facility] Phone:  [phone] Medical Equipment/Items Ordered:  16"x16" lightweight wheelchair with 16"x16" deep contoured cushion, bilateral elevating leg rests, 16"x16" contoured back, pelvic positioning belt - they are ordering your cushion and will get to you             Agency/Supplier:  [facility]          Phone:  [phone]                                                                 Drop arm commode  Agency/Supplier:  [facility]      Phone:  [phone]  GENERAL COMMUNITY RESOURCES FOR PATIENT/FAMILY: Support Groups:  Stroke Support Group                              4th Tuesday of the month                              Noon - 1:30 pm                              [facility] [place], [place]                              [phone]  Special Instructions:    My questions have been answered and I understand these instructions. I will adhere to these goals and the provided educational materials after my discharge from the hospital.  Patient/Caregiver Signature _______________________________ Date __________  Clinician Signature _______________________________________ Date __________  Please bring this form and your medication list with you to all your follow-up doctor's appointments.

## 2014-08-03 NOTE — Progress Notes (Signed)
Pt was evaluated by Harriet Butte, Physical Therapist, and a manual wheelchair was recommended for pt due his limited mobility which significantly impairs his ability to participate in multiple mobility-related activities of daily living.  His mobility limitation cannot be resolved by a cane or walker, but the wheelchair significantly improves his ability to navigate through his home and complete his activities of daily living. He and his caregivers (dtr,son, and family friend) are willing and able to use the wheelchair for these purposes.  Pt requires a contoured seat and contoured back for trunk support due to hemiparesis from the stroke and this cannot be accommodated in a standard, lightweight wheelchair.  He will be spending at least three hours a day in the wheelchair.  The medical team agrees with the PT's recommendation.

## 2014-08-07 ENCOUNTER — Telehealth: Payer: Self-pay | Admitting: *Deleted

## 2014-08-07 NOTE — Telephone Encounter (Signed)
Ok per verbal with Dr Wynn BankerKirsteins. Linda notified.

## 2014-08-07 NOTE — Telephone Encounter (Signed)
Michael Goodwin is going out to see [name] today and caregiver is reporting blood in urine and frequency.  He has not established with PCP at [facility]  Yet.  Can they have order for UA, C& S?

## 2014-08-13 ENCOUNTER — Emergency Department
Admission: EM | Admit: 2014-08-13 | Discharge: 2014-08-13 | Disposition: A | Discharge status: Home / Self Care | Payer: Medicare Other | Attending: Emergency Medicine | Admitting: Emergency Medicine

## 2014-08-13 ENCOUNTER — Encounter: Payer: Self-pay | Admitting: *Deleted

## 2014-08-13 DIAGNOSIS — R319 Hematuria, unspecified: Secondary | ICD-10-CM | POA: Diagnosis not present

## 2014-08-13 DIAGNOSIS — I10 Essential (primary) hypertension: Secondary | ICD-10-CM | POA: Insufficient documentation

## 2014-08-13 DIAGNOSIS — Z79899 Other long term (current) drug therapy: Secondary | ICD-10-CM | POA: Insufficient documentation

## 2014-08-13 DIAGNOSIS — Z720 Tobacco use: Secondary | ICD-10-CM | POA: Insufficient documentation

## 2014-08-13 HISTORY — DX: Cerebral infarction, unspecified: I63.9

## 2014-08-13 LAB — COMPREHENSIVE METABOLIC PANEL
ALBUMIN: 4.3 g/dL (ref 3.5–5.0)
ALT: 19 U/L (ref 17–63)
AST: 22 U/L (ref 15–41)
Alkaline Phosphatase: 61 U/L (ref 38–126)
Anion gap: 7 (ref 5–15)
BUN: 16 mg/dL (ref 6–20)
CHLORIDE: 106 mmol/L (ref 101–111)
CO2: 28 mmol/L (ref 22–32)
Calcium: 9.7 mg/dL (ref 8.9–10.3)
Creatinine, Ser: 1.41 mg/dL — ABNORMAL HIGH (ref 0.61–1.24)
GFR calc Af Amer: 57 mL/min — ABNORMAL LOW (ref >60)
GFR calc non Af Amer: 49 mL/min — ABNORMAL LOW (ref >60)
Glucose, Bld: 97 mg/dL (ref 65–99)
POTASSIUM: 4.1 mmol/L (ref 3.5–5.1)
Sodium: 141 mmol/L (ref 135–145)
Total Bilirubin: 0.4 mg/dL (ref 0.3–1.2)
Total Protein: 7.9 g/dL (ref 6.5–8.1)

## 2014-08-13 LAB — URINALYSIS COMPLETE WITH MICROSCOPIC (ARMC ONLY)
BACTERIA UA: NONE SEEN
BILIRUBIN URINE: NEGATIVE
GLUCOSE, UA: NEGATIVE mg/dL
KETONES UR: NEGATIVE mg/dL
NITRITE: NEGATIVE
Protein, ur: 30 mg/dL — AB
Specific Gravity, Urine: 1.026 (ref 1.005–1.030)
pH: 5 (ref 5.0–8.0)

## 2014-08-13 LAB — CBC
HEMATOCRIT: 36.6 % — AB (ref 40.0–52.0)
HEMOGLOBIN: 11.8 g/dL — AB (ref 13.0–18.0)
MCH: 29.2 pg (ref 26.0–34.0)
MCHC: 32.1 g/dL (ref 32.0–36.0)
MCV: 90.9 fL (ref 80.0–100.0)
PLATELETS: 184 10*3/uL (ref 150–440)
RBC: 4.02 MIL/uL — ABNORMAL LOW (ref 4.40–5.90)
RDW: 13.5 % (ref 11.5–14.5)
WBC: 5.5 10*3/uL (ref 3.8–10.6)

## 2014-08-13 NOTE — ED Notes (Signed)
Pt to ED from home via EMS with hematuria x1 week, worsening today. Pt denies any pain at this time. Vitals wnl. Pt with hx of stroke x 1 month ago, deficits noted to the right side of body. Pt is alert and oriented per baseline.

## 2014-08-13 NOTE — Discharge Instructions (Signed)

## 2014-08-13 NOTE — ED Provider Notes (Signed)
[facility] Emergency Department Provider Note  ____________________________________________  Time seen: On arrival, the EMS  I have reviewed the triage vital signs and the nursing notes.   HISTORY  Chief Complaint Hematuria  Patient with recent stroke  HPI Michael Goodwin is a 69 y.o. male who presents with hematuria. He reports he feels well has no pain. He does not appear to be on blood thinners. He recently had a stroke which left him with deficits and he is undergoing rehabilitation. He has never had blood in his urine before. He has no flank pain. He has no fevers no chills.     Past Medical History  Diagnosis Date  . Stroke     Patient Active Problem List   Diagnosis Date Noted  . Alterations of sensations following CVA (cerebrovascular accident) 07/10/2014  . Acute respiratory failure with hypoxia 07/07/2014  . Abnormal urinalysis 07/07/2014  . Hypokalemia 07/07/2014  . Dehydration 07/07/2014  . Metabolic encephalopathy 07/07/2014  . Aortic root dilatation (48 mm May 2016) 07/07/2014  . 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 ICH (intracerebral hemorrhage) 07/07/2014  . 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 Hemiplegia affecting right dominant side 07/06/2014  . Cognitive deficit due to recent stroke 07/06/2014  . IVH (intraventricular hemorrhage) 07/06/2014  . Essential hypertension 07/06/2014  . Intracranial hemorrhage     History reviewed. No pertinent past surgical history.  Current Outpatient Rx  Name  Route  Sig  Dispense  Refill  . amLODipine (NORVASC) 5 MG tablet   Oral   Take 1 tablet (5 mg total) by mouth daily.   30 tablet   1   . lisinopril (PRINIVIL,ZESTRIL) 10 MG tablet   Oral   Take 1 tablet (10 mg total) by mouth daily.   30 tablet   1   . Maltodextrin-Xanthan Gum (RESOURCE THICKENUP CLEAR) POWD   Oral   Take 1 Container by mouth as needed.   1 Can   1   . methylphenidate (RITALIN)  10 MG tablet   Oral   Take 1 tablet (10 mg total) by mouth 2 (two) times daily with breakfast and lunch.   90 tablet   0   . mirtazapine (REMERON) 15 MG tablet   Oral   Take 1 tablet (15 mg total) by mouth at bedtime.   30 tablet   1     Allergies Review of patient's allergies indicates no known allergies.  History reviewed. No pertinent family history.  Social History History  Substance Use Topics  . Smoking status: Current Every Day Smoker -- 1.00 packs/day  . Smokeless tobacco: Not on file  . Alcohol Use: Yes     Comment: Occasional    Review of Systems  Constitutional: Negative for fever.  ENT: Negative for sore throat Cardiovascular: Negative for chest pain. Respiratory: Negative for shortness of breath. Gastrointestinal: Negative for abdominal pain, vomiting and diarrhea. Genitourinary: Negative for dysuria. Positive for hematuria Musculoskeletal: Negative for back pain. Skin: Negative for rash. Neurological: Negative for headaches or new weakness Psychiatric: No anxiety  10-point ROS otherwise negative.  ____________________________________________   PHYSICAL EXAM:  VITAL SIGNS: ED Triage Vitals  Enc Vitals Group     BP 08/13/14 1254 121/83 mmHg     Pulse Rate 08/13/14 1254 95     Resp 08/13/14 1254 16     Temp 08/13/14 1254 97.7 F (36.5 C)     Temp Source 08/13/14 1254 Oral     SpO2 08/13/14  1254 97 %     Weight 08/13/14 1254 150 lb 2.1 oz (68.1 kg)     Height 08/13/14 1254 6' (1.829 m)     Head Cir --      Peak Flow --      Pain Score 08/13/14 1255 0     Pain Loc --      Pain Edu? --      Excl. in GC? --     Constitutional: Alert and oriented. Well appearing and in no distress. Eyes: Conjunctivae are normal.  ENT   Head: Normocephalic and atraumatic.   Mouth/Throat: Mucous membranes are moist. Cardiovascular: Normal rate, regular rhythm. Normal and symmetric distal pulses are present in all extremities. No murmurs, rubs, or  gallops. Respiratory: Normal respiratory effort without tachypnea nor retractions. Breath sounds are clear and equal bilaterally.  Gastrointestinal: Soft and non-tender in all quadrants. No distention. There is no CVA tenderness. Genitourinary: No rash or swelling Musculoskeletal:No lower extremity tenderness nor edema.  Skin:  Skin is warm, dry and intact. No rash noted. Psychiatric: Mood and affect are normal. Patient exhibits appropriate insight and judgment.  ____________________________________________    LABS (pertinent positives/negatives)  Labs Reviewed  CBC - Abnormal; Notable for the following:    RBC 4.02 (*)    Hemoglobin 11.8 (*)    HCT 36.6 (*)    All other components within normal limits  COMPREHENSIVE METABOLIC PANEL - Abnormal; Notable for the following:    Creatinine, Ser 1.41 (*)    GFR calc non Af Amer 49 (*)    GFR calc Af Amer 57 (*)    All other components within normal limits  URINALYSIS COMPLETEWITH MICROSCOPIC ([facility] ONLY)    ____________________________________________   EKG  None  ____________________________________________    RADIOLOGY I have personally reviewed any xrays that were ordered on this patient: None  ____________________________________________   PROCEDURES  Procedure(s) performed: none  Critical Care performed: none  ____________________________________________   INITIAL IMPRESSION / ASSESSMENT AND PLAN / ED COURSE  Pertinent labs & imaging results that were available during my care of the patient were reviewed by me and considered in my medical decision making (see chart for details).  Labs unremarkable, pending urinalysis.  Patient with significant amount of blood and urine although no evidence for infection. Patient will require urology follow-up for likely cystoscopy and further workup.  ____________________________________________   FINAL CLINICAL IMPRESSION(S) / ED DIAGNOSES  Final diagnoses:   Hematuria     Jene Every, MD 08/13/14 1505

## 2014-08-13 NOTE — ED Notes (Signed)
Patient unable to sit unattended due to recent stroke. Son will be here to pick him up in about 20 minutes. Will wait on his arrival to go over discharge instructions.

## 2014-08-13 NOTE — ED Notes (Signed)
MD Kinner at bedside  

## 2014-08-23 ENCOUNTER — Ambulatory Visit: Payer: Self-pay | Admitting: Neurology

## 2014-08-25 DIAGNOSIS — I10 Essential (primary) hypertension: Secondary | ICD-10-CM | POA: Diagnosis not present

## 2014-08-25 DIAGNOSIS — Z720 Tobacco use: Secondary | ICD-10-CM | POA: Diagnosis not present

## 2014-08-25 DIAGNOSIS — I69351 Hemiplegia and hemiparesis following cerebral infarction affecting right dominant side: Secondary | ICD-10-CM | POA: Diagnosis not present

## 2014-08-25 DIAGNOSIS — I69391 Dysphagia following cerebral infarction: Secondary | ICD-10-CM | POA: Diagnosis not present

## 2014-08-28 ENCOUNTER — Encounter: Payer: Medicare Other | Attending: Physical Medicine & Rehabilitation

## 2014-08-28 ENCOUNTER — Inpatient Hospital Stay: Payer: Medicare Other | Admitting: Physical Medicine & Rehabilitation

## 2014-08-30 ENCOUNTER — Ambulatory Visit (INDEPENDENT_AMBULATORY_CARE_PROVIDER_SITE_OTHER): Payer: Medicare Other | Admitting: Urology

## 2014-08-30 ENCOUNTER — Encounter: Payer: Self-pay | Admitting: Urology

## 2014-08-30 VITALS — BP 122/82 | HR 81 | Ht 68.0 in | Wt 138.9 lb

## 2014-08-30 DIAGNOSIS — Z720 Tobacco use: Secondary | ICD-10-CM | POA: Diagnosis not present

## 2014-08-30 DIAGNOSIS — R310 Gross hematuria: Secondary | ICD-10-CM | POA: Diagnosis not present

## 2014-08-30 DIAGNOSIS — R339 Retention of urine, unspecified: Secondary | ICD-10-CM

## 2014-08-30 DIAGNOSIS — I639 Cerebral infarction, unspecified: Secondary | ICD-10-CM

## 2014-08-30 DIAGNOSIS — F17200 Nicotine dependence, unspecified, uncomplicated: Secondary | ICD-10-CM

## 2014-08-30 LAB — URINALYSIS, COMPLETE
Bilirubin, UA: NEGATIVE
Glucose, UA: NEGATIVE
Leukocytes, UA: NEGATIVE
Nitrite, UA: NEGATIVE
PH UA: 5.5 (ref 5.0–7.5)
Specific Gravity, UA: >1.03 — ABNORMAL HIGH (ref 1.005–1.030)
Urobilinogen, Ur: 1 mg/dL (ref 0.2–1.0)

## 2014-08-30 LAB — MICROSCOPIC EXAMINATION

## 2014-08-30 LAB — BLADDER SCAN AMB NON-IMAGING: Scan Result: 35

## 2014-08-30 NOTE — Progress Notes (Signed)
08/30/2014 12:52 PM   Michael Goodwin 1945/11/25 [id number]  Referring provider: Patrice Paradise, MD [address] [place], [place] [address]  Chief Complaint  Patient presents with  . Follow-up    ER f/u of hematuria    HPI: 69 yo M referred for ED follow up for work up of gross hematuria.  [name] had a CVA  (hemorrhagic) in May 2016 at which time he was admitted to [facility]. Following the stroke, he had a Foley until about 1 month ago.  He is now at home and receiving PT OT services.  Since that catheter was removed, he started to develop epidoses of painless gross hematuria with occational clots.   These episodes tend to come and go.  No evidence of infection at emergency room visit on 08/13/2014.  No fevers or chills.  No dysuria.  He does feel that he is able empty his bladder for the most part. Initially when the catheter was removed, he did have some difficulty urinating and emptying his bladder but he feels that this is mostly resolved. No dysuria. No flank pain. No history of kidney stones.  He smokes 1/2 ppd, previously previously 1.5 ppd x 55 years.    He does have neurological deficits from a stroke, right hemiplegia. He is currently wheelchair-bound.  He is not on any blood thinner medications.  Patient and his family report or to his stroke, he did not seek any routine medical care. He is unsure if he is ever had a rectal exam or PSA screening.   PMH: Past Medical History  Diagnosis Date  . Stroke     Surgical History: No past surgical history on file.  Home Medications:    Medication List       This list is accurate as of: 08/30/14 12:52 PM.  Always use your most recent med list.               amLODipine 5 MG tablet  Commonly known as:  NORVASC  Take 1 tablet (5 mg total) by mouth daily.     lisinopril 10 MG tablet  Commonly known as:  PRINIVIL,ZESTRIL  Take 1 tablet (10 mg total) by mouth daily.     methylphenidate 10 MG  tablet  Commonly known as:  RITALIN  Take 1 tablet (10 mg total) by mouth 2 (two) times daily with breakfast and lunch.     mirtazapine 15 MG tablet  Commonly known as:  REMERON  Take 1 tablet (15 mg total) by mouth at bedtime.       He is on ASA 81 mg for pain.    Allergies: No Known Allergies  Family History: No family history on file.  Social History:  reports that he has been smoking.  He does not have any smokeless tobacco history on file. He reports that he drinks alcohol. His drug history is not on file.  ROS: UROLOGY Frequent Urination?: No Hard to postpone urination?: No Burning/pain with urination?: No Get up at night to urinate?: Yes Leakage of urine?: No Urine stream starts and stops?: No Trouble starting stream?: Yes Do you have to strain to urinate?: Yes Blood in urine?: Yes Urinary tract infection?: No Sexually transmitted disease?: No Injury to kidneys or bladder?: No Painful intercourse?: No Weak stream?: No Erection problems?: Yes Penile pain?: No  Gastrointestinal Nausea?: No Vomiting?: No Indigestion/heartburn?: No Diarrhea?: No Constipation?: No  Constitutional Fever: No Night sweats?: No Weight loss?: Yes Fatigue?: No  Skin Skin rash/lesions?: No Itching?: No  Eyes Blurred vision?: No Double vision?: No  Ears/Nose/Throat Sore throat?: No Sinus problems?: No  Hematologic/Lymphatic Swollen glands?: No Easy bruising?: No  Cardiovascular Leg swelling?: No Chest pain?: No  Respiratory Cough?: Yes Shortness of breath?: No  Endocrine Excessive thirst?: No  Musculoskeletal Back pain?: No Joint pain?: No  Neurological Headaches?: No Dizziness?: No  Psychologic Depression?: No Anxiety?: No  Physical Exam: BP 122/82 mmHg  Pulse 81  Ht 5\' 8"  (1.727 m)  Wt 138 lb 14.4 oz (63.005 kg)  BMI 21.12 kg/m2  Constitutional:  Alert and oriented, No acute distress.  In wheelchair, presents with daughter and son. Smells of  smoke. HEENT: [place] AT, moist mucus membranes.  Trachea midline, no masses. Cardiovascular: No clubbing, cyanosis, or edema. Respiratory: Normal respiratory effort, no increased work of breathing. GI: Abdomen is soft, nontender, nondistended, no abdominal masses GU: No CVA tenderness.  Skin: No rashes, bruises or suspicious lesions. Lymph: No cervical or inguinal adenopathy. Neurologic: Grossly intact.  Right upper extremity and leg weakness. Psychiatric: Normal mood and affect.  Laboratory Data: Lab Results  Component Value Date   WBC 5.5 08/13/2014   HGB 11.8* 08/13/2014   HCT 36.6* 08/13/2014   MCV 90.9 08/13/2014   PLT 184 08/13/2014    Lab Results  Component Value Date   CREATININE 1.41* 08/13/2014     Lab Results  Component Value Date   HGBA1C 5.8* 07/05/2014    Urinalysis Results for orders placed or performed in visit on 08/30/14  BLADDER SCAN AMB NON-IMAGING  Result Value Ref Range   Scan Result 35     Pertinent Imaging: Results for orders placed or performed in visit on 08/30/14  BLADDER SCAN AMB NON-IMAGING  Result Value Ref Range   Scan Result 35     Assessment & Plan:  69 year old male with painless gross hematuria. No evidence of infection. No history of kidney stones. He is not currently on any blood thinners to exacerbate his bleeding. Given his extensive smoking history, I would highly recommend proceeding with full hematuria workup. Unfortunately, he does renal dysfunction, most recent creatinine 1.4 therefore not able to tolerate contrast for CT urogram. We'll proceed with noncontrast CT abdomen pelvis as well as cystoscopy. Differential diagnosis for gross hematuria was discussed extensively today with the patient and his family.  1. Gross hematuria Plan for workup including CT abdomen and pelvis without contrast and cystoscopy.  Given the recent stroke, plan to avoid OR and sedation at this time for bilateral retrogrades. Will reassess in the  future. - Urinalysis, Complete - CULTURE, URINE COMPREHENSIVE - CT Abdomen Pelvis Wo Contrast; Future - BLADDER SCAN AMB NON-IMAGING  2. Urinary retention History of postop urinary retention following stroke initially. His Foley catheter since been removed and he is voiding fine. Postvoid residual today is minimal.  3. Current smoker We discussed the importance of smoking cessation as well as its relationship to have bladder cancer and other medical problems. He has cut back to proximal half a pack a day from 1.5 packs per day, but is not willing to quit at this time.   Return in about 4 weeks (around 09/27/2014) for cystoscopy.  Vanna ScotlandAshley Laketia Vicknair, MD  [facility] [address], [address] [place], [place] [address] [phone]

## 2014-09-01 LAB — CULTURE, URINE COMPREHENSIVE

## 2014-09-04 ENCOUNTER — Telehealth: Payer: Self-pay | Admitting: *Deleted

## 2014-09-04 NOTE — Telephone Encounter (Signed)
[facility] called to continue visits for ST for Mr Vigen.  Approval given.

## 2014-09-05 ENCOUNTER — Encounter: Payer: Self-pay | Admitting: Neurology

## 2014-09-05 ENCOUNTER — Ambulatory Visit (INDEPENDENT_AMBULATORY_CARE_PROVIDER_SITE_OTHER): Payer: Medicare Other | Admitting: Neurology

## 2014-09-05 VITALS — BP 109/77 | HR 102 | Temp 98.1°F | Ht 68.0 in

## 2014-09-05 DIAGNOSIS — I610 Nontraumatic intracerebral hemorrhage in hemisphere, subcortical: Secondary | ICD-10-CM

## 2014-09-05 DIAGNOSIS — I639 Cerebral infarction, unspecified: Secondary | ICD-10-CM

## 2014-09-05 DIAGNOSIS — I619 Nontraumatic intracerebral hemorrhage, unspecified: Secondary | ICD-10-CM | POA: Diagnosis not present

## 2014-09-05 NOTE — Progress Notes (Signed)
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 male here as a referral from Dr. Merlinda Frederick for BG hemorrhage. PMHx HTN. Was found down with confusion and right-sided weakness. CT of the head showed left BG hemorrhage with intraventricular extension.   His physical therapy is going well. He can walk across the living room with assistance. He lives at home with daughter who provides most information. He always has family around him 24 hours a day. Physical therapy coming to the house. Speech is coming. He is eating better now that his diet is not puree.  Weakness has improved, he has good and bad days. But they can say significantly improved. Daughter feels he can see more, vision improved. Daughter and family gives his medications. Blood pressure has been excellent in the 120s/70s.   Reviewed notes, labs and imaging from outside physicians, which showed:  LDL 55, HgbA1c 5.8  Ct Head Wo Contrast  07/05/2014 IMPRESSION: Evolving LEFT thalamic 3.3 x 2.3 cm hematoma with intraventricular extension. 4 mm LEFT-to-RIGHT midline shift. Mild hydrocephalus suspected. Moderate white matter changes suggest chronic small vessel ischemic disease.   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   Carotid Doppler Mild to moderate plaque carotid bifurcations bilaterally. ICA velocities and ratios within the 1-39% range of stenosis bilaterally. Vertebral arteries are patent and antegrade bilaterally.  2D Echocardiogram - Left ventricle: The cavity size was normal. Wall thickness was increased in a pattern of moderate LVH. The estimated  ejection fraction was 60%. Wall motion was normal; there were no regional wall motion abnormalities. - Aortic valve: Sclerosis without stenosis. There was trivial regurgitation. 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 Aortic root dimension: 48 mm (ED). Ascending aortic diameter: 50 mm (S). This needs to be followed. - Right ventricle: The cavity size was normal. Systolic function was normal.  EKG normal sinus rhythm. For complete results please see formal report.  Review of Systems: Patient complains of symptoms per HPI as well as the following symptoms: Np CP, no SOB Pertinent negatives per HPI. All others negative.   History   Social History  . Marital Status: Widowed    Spouse Name: N/A  . Number of Children: 3  . Years of Education: 7   Occupational History  . Not on file.   Social History Main Topics  . Smoking status: Current Every Day Smoker -- 0.20 packs/day    Types: Cigarettes  . Smokeless tobacco: Not on file  . Alcohol Use: No  . Drug Use: No  . Sexual Activity: Not on file   Other Topics Concern  . Not on file   Social History Narrative   Lives at home with daughter and cousin, brother.   Caffeine use: Coffee occassionally     Family History  Problem Relation Age of Onset  . Diabetes      Past Medical History  Diagnosis Date  . Stroke   . Essential hypertension 07/06/2014  . 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 Hemiplegia affecting right dominant side 07/06/2014    Past Surgical History  Procedure Laterality Date  . No past surgeries      Current Outpatient Prescriptions  Medication Sig  Dispense Refill  . amLODipine (NORVASC) 5 MG tablet Take 1 tablet (5 mg total) by mouth daily. 30 tablet 1  . lisinopril (PRINIVIL,ZESTRIL) 10 MG tablet Take 1 tablet (10 mg total) by mouth daily. 30 tablet 1  . methylphenidate (RITALIN) 10 MG tablet Take 1 tablet (10 mg total) by mouth 2 (two)  times daily with breakfast and lunch. 90 tablet 0  . mirtazapine (REMERON) 15 MG tablet Take 1 tablet (15 mg total) by mouth at bedtime. 30 tablet 1   No current facility-administered medications for this visit.    Allergies as of 09/05/2014  . (No Known Allergies)    Vitals: BP 109/77 mmHg  Pulse 102  Temp(Src) 98.1 F (36.7 C) (Oral)  Ht  (1.727 m) Last Weight:  Wt Readings from Last 1 Encounters:  08/30/14 138 lb 14.4 oz (63.005 kg)   Last Height:   Ht Readings from Last 1 Encounters:  09/05/14  (1.727 m)   Physical exam: Exam: Gen: NAD, conversant, well nourised, obese, well groomed                     CV: RRR, no MRG. No Carotid Bruits. No peripheral edema, warm, nontender Eyes: Conjunctivae clear without exudates or hemorrhage  Neuro: Detailed Neurologic Exam  Speech:    Dysarthria Cognition:    The patient is oriented to person, doesn't know the date or year    Recent and remote memory impaired;     language dysarthric, no aphasia;     normal attention, concentration,     fund of knowledge - doesn't know who is running for president Cranial Nerves:    The pupils are equal, round, and reactive to light. EOMI. Attempted fundi could not visualize. Right homonomous hemianopia.  Impaired upgaze. Trigeminal sensation is intact and the muscles of mastication are normal. Right lower facial droop. The palate elevates in the midline. Hearing impaired. Voice is normal. Shoulder shrug is normal. The tongue has normal motion without fasciculations.   Coordination:    Impaired on the right due to weakness, intact on the left  Gait:    Can bear weight with assistance   Motor Observation:    No asymmetry, no atrophy, and no involuntary movements noted. Tone:    increased tone right biceps  Posture:    Posture is normal. normal erect    Strength:    Right hemiparesis. Right side antigravity.     Sensation: right hemisensory loss     Reflex Exam: brisk  right arm  DTR's:    Deep tendon reflexes in the upper and lower extremities are brisk right arm   Toes:    The toes are downgoing bilaterally.   Clonus:    Clonus is absent.       Assessment/Plan:  69 year old patient with dominant left BG ICH with ventricular extension likely hypertensive.   Needs close f/u with PCP for management of vascular risk factors Ongoing aggressive stroke risk factor management Cont therapy HTN appears to be well controlled. Family is caring for patient.  He is on ritalin per Dr. Doroteo Bradford, will not continue. Per Dr. Jodean Lima notes, for arousal. He seems to be doing well. Advised him to call Dr. Doroteo Bradford for instructions on continues ritalin but do think it is ok for him to discontinue at this time. If his arousal changes, xontact our office.  Need to monitor increased tone, stretching exercises, discussed with family, cont PT, if becomes severe discussed  botox  Naomie Dean, MD  [facility] [address] [place], [place] [address]  Phone [phone] Fax [phone]

## 2014-09-06 ENCOUNTER — Telehealth: Payer: Self-pay | Admitting: Neurology

## 2014-09-06 NOTE — Telephone Encounter (Signed)
Called and spoke w/ Harriett Sine from [facility] and told her Dr. Lucia Gaskins is okay w/ extending home health OT for pt. She took verbal order and will extend for the next 4 weeks through [facility]. Told her to call w/ any more questions. She verbalized understanding.

## 2014-09-06 NOTE — Telephone Encounter (Signed)
Michael Goodwin - would you call [facility] I am fine with extending.

## 2014-09-06 NOTE — Telephone Encounter (Signed)
Michael Goodwin with [facility] is calling to get an extension for home health occupational therapy for the patient. Please call and discuss. Thank you.

## 2014-09-13 ENCOUNTER — Ambulatory Visit
Admission: RE | Admit: 2014-09-13 | Discharge: 2014-09-13 | Disposition: A | Discharge status: Home / Self Care | Payer: Medicare Other | Source: Ambulatory Visit | Attending: Urology | Admitting: Urology

## 2014-09-13 DIAGNOSIS — N400 Enlarged prostate without lower urinary tract symptoms: Secondary | ICD-10-CM | POA: Diagnosis not present

## 2014-09-13 DIAGNOSIS — K5730 Diverticulosis of large intestine without perforation or abscess without bleeding: Secondary | ICD-10-CM | POA: Diagnosis not present

## 2014-09-13 DIAGNOSIS — R310 Gross hematuria: Secondary | ICD-10-CM | POA: Insufficient documentation

## 2014-09-14 ENCOUNTER — Telehealth: Payer: Self-pay | Admitting: *Deleted

## 2014-09-14 NOTE — Telephone Encounter (Signed)
I spoke w/the patient and relayed their lab results and confirmed their f/u appointment with them.  The pt indicated understanding and had no questions.

## 2014-09-14 NOTE — Telephone Encounter (Signed)
-----   Message from Vanna Scotland, MD sent at 09/13/2014  9:12 AM EDT ----- Please let patient know CT Urogram looks good.  We will see him back in a few weeks for cystoscopy to complete his work up.    Vanna Scotland, MD

## 2014-10-02 ENCOUNTER — Ambulatory Visit (HOSPITAL_BASED_OUTPATIENT_CLINIC_OR_DEPARTMENT_OTHER): Payer: Medicare Other | Admitting: Physical Medicine & Rehabilitation

## 2014-10-02 ENCOUNTER — Encounter: Payer: Self-pay | Admitting: Physical Medicine & Rehabilitation

## 2014-10-02 ENCOUNTER — Encounter: Payer: Medicare Other | Attending: Physical Medicine & Rehabilitation

## 2014-10-02 VITALS — BP 148/81 | HR 74

## 2014-10-02 DIAGNOSIS — I6981 Cognitive deficits following other cerebrovascular disease: Secondary | ICD-10-CM

## 2014-10-02 DIAGNOSIS — I10 Essential (primary) hypertension: Secondary | ICD-10-CM | POA: Diagnosis not present

## 2014-10-02 DIAGNOSIS — I619 Nontraumatic intracerebral hemorrhage, unspecified: Secondary | ICD-10-CM | POA: Diagnosis not present

## 2014-10-02 DIAGNOSIS — F17210 Nicotine dependence, cigarettes, uncomplicated: Secondary | ICD-10-CM | POA: Diagnosis not present

## 2014-10-02 DIAGNOSIS — I69351 Hemiplegia and hemiparesis following cerebral infarction affecting right dominant side: Secondary | ICD-10-CM | POA: Insufficient documentation

## 2014-10-02 DIAGNOSIS — Z833 Family history of diabetes mellitus: Secondary | ICD-10-CM | POA: Insufficient documentation

## 2014-10-02 DIAGNOSIS — I639 Cerebral infarction, unspecified: Secondary | ICD-10-CM | POA: Diagnosis not present

## 2014-10-02 DIAGNOSIS — G8191 Hemiplegia, unspecified affecting right dominant side: Secondary | ICD-10-CM | POA: Diagnosis not present

## 2014-10-02 DIAGNOSIS — I69319 Unspecified symptoms and signs involving cognitive functions following cerebral infarction: Secondary | ICD-10-CM

## 2014-10-02 DIAGNOSIS — I69398 Other sequelae of cerebral infarction: Secondary | ICD-10-CM | POA: Insufficient documentation

## 2014-10-02 DIAGNOSIS — I6931 Cognitive deficits following cerebral infarction: Secondary | ICD-10-CM | POA: Diagnosis not present

## 2014-10-02 MED ORDER — MIRTAZAPINE 15 MG PO TABS: 15.0000 mg | ORAL_TABLET | Freq: Every day | ORAL | Status: AC

## 2014-10-02 MED ORDER — METHYLPHENIDATE HCL 10 MG PO TABS: 10.0000 mg | ORAL_TABLET | Freq: Two times a day (BID) | ORAL | Status: DC

## 2014-10-02 MED ORDER — AMLODIPINE BESYLATE 5 MG PO TABS: 5.0000 mg | ORAL_TABLET | Freq: Every day | ORAL | Status: AC

## 2014-10-02 MED ORDER — LISINOPRIL 10 MG PO TABS: 10.0000 mg | ORAL_TABLET | Freq: Every day | ORAL | Status: AC

## 2014-10-02 NOTE — Progress Notes (Signed)
Subjective:    Patient ID: Michael Goodwin, male    DOB: [birth year], 69 y.o.   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 He is receiving home health PT OT and speech therapy Mr boyd has an elevated phq9 score.  He is out of ritalin and OT note says he is sleeping more during day --see note in folder  Patient sees his primary care physician Tomorrow but has run out of his antihypertensive medication one day ago.  We reviewed and discussed the PHQ9 score. He has had a significant stroke, altered energy poor sleep, trouble concentrating and decreased energy levels can all be related to his stroke. That would account for 12 points.  He still likely has a depressive component however not as severe as the pH Q 9 score would imply  He is living at home with his daughter's assistance as well as the assistance of his cousin. He is requiring help for dressing, Bathing, groomingAs well as mobility.  Home health therapy has advanced  ambulation and now are requesting hemiwalker which appears appropriate Tub bench has also been requested which also appears to be appropriate at the current time Pain Inventory Average Pain 0 Pain Right Now 0 My pain is no pain  In the last 24 hours, has pain interfered with the following? General activity 2 Relation with others 0 Enjoyment of life 1 What TIME of day is your pain at its worst? morning and night Sleep (in general) Good  Pain is worse with: no pain Pain improves with: no pain Relief from Meds: no pain  Mobility walk with assistance use a walker how many minutes can you walk? 10 steps ability to climb steps?  no do you drive?  no use a wheelchair needs help with transfers  Function disabled: date disabled 07/04/2014 I need assistance with  the following:  dressing, bathing, toileting, meal prep, household duties and shopping  Neuro/Psych weakness numbness tingling trouble walking confusion depression  Prior Studies Any changes since last visit?  no  Physicians involved in your care Any changes since last visit?  no   Family History  Problem Relation Age of Onset  . Diabetes     Social History   Social History  . Marital Status: Widowed    Spouse Name: N/A  . Number of Children: 3  . Years of Education: 7   Social History Main Topics  . Smoking status: Current Every Day Smoker -- 0.20 packs/day    Types: Cigarettes  . Smokeless tobacco: None  . Alcohol Use: No  . Drug Use: No  . Sexual Activity: Not Asked   Other Topics Concern  . None   Social History Narrative   Lives at home with daughter and cousin, brother.   Caffeine use: Coffee occassionally    Past Surgical History  Procedure Laterality Date  . No past surgeries     Past Medical History  Diagnosis Date  . Stroke   . Essential hypertension 07/06/2014  . 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 Hemiplegia affecting right dominant side 07/06/2014   BP 148/81 mmHg  Pulse 74  SpO2 99%  Opioid Risk Score:   Fall Risk Score:  `1  Depression screen PHQ 2/9  Depression screen PHQ 2/9 10/02/2014  Decreased Interest 3  Down, Depressed, Hopeless 3  PHQ -  2 Score 6  Altered sleeping 3  Tired, decreased energy 3  Change in appetite 3  Feeling bad or failure about yourself  3  Trouble concentrating 3  Moving slowly or fidgety/restless 3  Suicidal thoughts 0  PHQ-9 Score 24     Review of Systems  Constitutional: Positive for appetite change and unexpected weight change.  Musculoskeletal: Positive for gait problem.  Neurological: Positive for weakness and numbness.       Tingling  Psychiatric/Behavioral: Positive for confusion and dysphoric mood.  All other systems reviewed and are negative.      Objective:   Physical  Exam  Constitutional: He appears well-developed and well-nourished.  HENT:  Head: Normocephalic and atraumatic.  Eyes: Conjunctivae are normal. Pupils are equal, round, and reactive to light.  Neck: Normal range of motion. Neck supple.  Cardiovascular: Normal rate, regular rhythm, normal heart sounds and intact distal pulses.   Pulmonary/Chest: Effort normal and breath sounds normal.  Abdominal: Soft. Bowel sounds are normal.  Neurological: He is alert. Coordination and gait abnormal.  Reflex Scores:      Tricep reflexes are 3+ on the right side and 2+ on the left side.      Bicep reflexes are 3+ on the right side and 2+ on the left side.      Brachioradialis reflexes are 3+ on the right side and 2+ on the left side.      Patellar reflexes are 3+ on the right side and 2+ on the left side.      Achilles reflexes are 3+ on the right side and 2+ on the left side. Oriented to person city, doctor's office but not specific day and date. The patient could not remember where he had seen me before needed cueing that it was at [facility]  Sensation to light touch is absent in the right upper extremity  Motor strength is graded as 3/5 in the right deltoid, biceps, triceps, grip Force minus/5 in the right hip flexor and knee extensor 3 minus in the right ankle dorsiflexor 5/5 in the left deltoid, biceps, triceps, grip, hip flexors, knee extensors, ankleplantar flexor  Gait was not tested secondary to lack of assistive device  Skin: Skin is warm and dry.  Psychiatric: He has a normal mood and affect.  Nursing note and vitals reviewed.         Assessment & Plan:   1. Left thalamic stroke with right hemiparesis he still has severe right hemisensory deficits and  Moderately severe cognitive deficits. He still would benefit from home health PT OT and speech therapy. I agree with hemiwalker I agree with tub bench I have written orders on his home care recertification sheet.  I'll see the  patient back in one month He will need to follow up with neurology in regards to his stroke He will need to follow up with primary care physician for blood pressure medications  I will refill his Zestril and Norvasc as well as Remeron and methylphenidate. I plan to keep the patient on methylphenidate for at least a couple more months. I do not anticipate keeping him on that medication long-term.  There is concerns about sleep apnea according to home health. He would need to follow up with neurology to get further evaluation for this.

## 2014-10-02 NOTE — Patient Instructions (Signed)
your primary physician Will refill the Lisinopril and Norvasc once those prescriptions run out  I can continue to fill the methylphenidate as well as the Remeron  I don't think  Michael Goodwin will need methylphenidate long-term

## 2014-10-03 ENCOUNTER — Ambulatory Visit (INDEPENDENT_AMBULATORY_CARE_PROVIDER_SITE_OTHER): Payer: Medicare Other | Admitting: Urology

## 2014-10-03 VITALS — BP 167/112 | HR 90 | Ht 72.0 in | Wt 138.8 lb

## 2014-10-03 DIAGNOSIS — R310 Gross hematuria: Secondary | ICD-10-CM | POA: Diagnosis not present

## 2014-10-03 DIAGNOSIS — N358 Other urethral stricture: Secondary | ICD-10-CM

## 2014-10-03 DIAGNOSIS — N400 Enlarged prostate without lower urinary tract symptoms: Secondary | ICD-10-CM

## 2014-10-03 LAB — URINALYSIS, COMPLETE
Bilirubin, UA: NEGATIVE
GLUCOSE, UA: NEGATIVE
KETONES UA: NEGATIVE
LEUKOCYTES UA: NEGATIVE
Nitrite, UA: NEGATIVE
Protein, UA: NEGATIVE
RBC, UA: NEGATIVE
Specific Gravity, UA: 1.025 (ref 1.005–1.030)
Urobilinogen, Ur: 0.2 mg/dL (ref 0.2–1.0)
pH, UA: 6 (ref 5.0–7.5)

## 2014-10-03 LAB — MICROSCOPIC EXAMINATION
Bacteria, UA: NONE SEEN
RBC, UA: NONE SEEN /hpf (ref 0–?)

## 2014-10-03 MED ORDER — CIPROFLOXACIN HCL 500 MG PO TABS
500.0000 mg | ORAL_TABLET | Freq: Once | ORAL | Status: AC
  Administered 2014-10-03: 500 mg via ORAL

## 2014-10-03 MED ORDER — TAMSULOSIN HCL 0.4 MG PO CAPS: 0.4000 mg | ORAL_CAPSULE | Freq: Every day | ORAL | Status: AC

## 2014-10-03 MED ORDER — LIDOCAINE HCL 2 % EX GEL
1.0000 "application " | Freq: Once | CUTANEOUS | Status: AC
  Administered 2014-10-03: 1 via URETHRAL

## 2014-10-03 NOTE — Progress Notes (Signed)
    Cystoscopy Procedure Note  Patient identification was confirmed, informed consent was obtained, and patient was prepped using Betadine solution.  Lidocaine jelly was administered per urethral meatus.    Preoperative abx where received prior to procedure.     Pre-Procedure: - Inspection reveals a normal caliber ureteral meatus.  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 -  prostate enlarged with lateral lobe enlargement not obstructed -  bladder neck normal - Bilateral ureteral orifices identified - Bladder mucosa  reveals no ulcers, tumors, or lesions - No bladder stones - No trabeculation  Retroflexion shows no masses or growths   Post-Procedure: - Patient tolerated the procedure well

## 2014-10-30 ENCOUNTER — Ambulatory Visit: Payer: Medicare Other | Admitting: Physical Medicine & Rehabilitation

## 2014-11-06 ENCOUNTER — Encounter: Payer: Medicare Other | Attending: Physical Medicine & Rehabilitation

## 2014-11-06 ENCOUNTER — Encounter: Payer: Self-pay | Admitting: Physical Medicine & Rehabilitation

## 2014-11-06 ENCOUNTER — Ambulatory Visit (HOSPITAL_BASED_OUTPATIENT_CLINIC_OR_DEPARTMENT_OTHER): Payer: Medicare Other | Admitting: Physical Medicine & Rehabilitation

## 2014-11-06 VITALS — BP 116/76 | HR 80 | Resp 14

## 2014-11-06 DIAGNOSIS — Z833 Family history of diabetes mellitus: Secondary | ICD-10-CM | POA: Diagnosis not present

## 2014-11-06 DIAGNOSIS — I69351 Hemiplegia and hemiparesis following cerebral infarction affecting right dominant side: Secondary | ICD-10-CM | POA: Insufficient documentation

## 2014-11-06 DIAGNOSIS — I10 Essential (primary) hypertension: Secondary | ICD-10-CM | POA: Insufficient documentation

## 2014-11-06 DIAGNOSIS — R208 Other disturbances of skin sensation: Secondary | ICD-10-CM

## 2014-11-06 DIAGNOSIS — I639 Cerebral infarction, unspecified: Secondary | ICD-10-CM | POA: Diagnosis not present

## 2014-11-06 DIAGNOSIS — I69398 Other sequelae of cerebral infarction: Secondary | ICD-10-CM | POA: Diagnosis not present

## 2014-11-06 DIAGNOSIS — I6931 Cognitive deficits following cerebral infarction: Secondary | ICD-10-CM | POA: Diagnosis not present

## 2014-11-06 DIAGNOSIS — F17210 Nicotine dependence, cigarettes, uncomplicated: Secondary | ICD-10-CM | POA: Insufficient documentation

## 2014-11-06 DIAGNOSIS — G8191 Hemiplegia, unspecified affecting right dominant side: Secondary | ICD-10-CM | POA: Diagnosis not present

## 2014-11-06 DIAGNOSIS — R209 Unspecified disturbances of skin sensation: Secondary | ICD-10-CM

## 2014-11-06 DIAGNOSIS — I69898 Other sequelae of other cerebrovascular disease: Secondary | ICD-10-CM

## 2014-11-06 MED ORDER — METHYLPHENIDATE HCL 5 MG PO TABS: 5.0000 mg | ORAL_TABLET | Freq: Two times a day (BID) | ORAL | Status: DC

## 2014-11-06 NOTE — Progress Notes (Signed)
Subjective:    Patient ID: Michael Goodwin, male    DOB: 29-May-1945, 69 y.o.   MRN: [id number] 69 year old right-handed male with history of tobacco abuse, hypertension on no prescription medications who lived alone independent prior to admission.  Presented Jul 04, 2014, with right-sided weakness and altered mental status.  CT of the head showed a left ICH with intraventricular extension.  Blood pressure 173/103 on admission.  Carotid Dopplers with no ICA stenosis. Echocardiogram with ejection fraction of 60%.  No wall motion abnormalities.  Followup cranial CT scan showed evolving left thalamic hematoma, 4-mm left-to-right midline shift.  Subcutaneous heparin initiated for DVT prophylaxis on Jul 06, 2014.  HPI Continues to receive home health PT OT as well as nursing. Family helps with him. He still needs assistance with bathing grooming dressing as well as mobility. Ambulates with a hemiwalker with physical assistance.  Pain Inventory Average Pain 7 Pain Right Now 7 My pain is intermittent and stabbing  In the last 24 hours, has pain interfered with the following? General activity 8 Relation with others 0 Enjoyment of life 0 What TIME of day is your pain at its worst? with movement Sleep (in general) Good  Pain is worse with: bending and some activites Pain improves with: therapy/exercise and medication Relief from Meds: 2  Mobility walk with assistance use a cane use a walker ability to climb steps?  no do you drive?  no use a wheelchair  Function disabled: date disabled .  Neuro/Psych No problems in this area  Prior Studies Any changes since last visit?  no  Physicians involved in your care Any changes since last visit?  no   Family History  Problem Relation Age of Onset  . Diabetes     Social History   Social History  . Marital Status: Widowed    Spouse Name: N/A  . Number of Children: 3  . Years of Education: 7   Social History Main Topics  .  Smoking status: Current Every Day Smoker -- 0.20 packs/day    Types: Cigarettes  . Smokeless tobacco: None  . Alcohol Use: No  . Drug Use: No  . Sexual Activity: Not Asked   Other Topics Concern  . None   Social History Narrative   Lives at home with daughter and cousin, brother.   Caffeine use: Coffee occassionally    Past Surgical History  Procedure Laterality Date  . No past surgeries     Past Medical History  Diagnosis Date  . Stroke   . Essential hypertension 07/06/2014  . 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 Hemiplegia affecting right dominant side 07/06/2014   BP 116/76 mmHg  Pulse 80  Resp 14  SpO2 96%  Opioid Risk Score:   Fall Risk Score:  `1  Depression screen PHQ 2/9  Depression screen PHQ 2/9 10/02/2014  Decreased Interest 3  Down, Depressed, Hopeless 3  PHQ - 2 Score 6  Altered sleeping 3  Tired, decreased energy 3  Change in appetite 3  Feeling bad or failure about yourself  3  Trouble concentrating 3  Moving slowly or fidgety/restless 3  Suicidal thoughts 0  PHQ-9 Score 24     Review of Systems  Gastrointestinal: Positive for constipation.  All other systems reviewed and are negative.      Objective:   Physical Exam  Constitutional: He is oriented to person, place, and time. He appears well-developed and well-nourished.  HENT:  Head: Normocephalic and atraumatic.  Eyes: Conjunctivae and  EOM are normal. Pupils are equal, round, and reactive to light.  Neck: Normal range of motion.  Neurological: He is alert and oriented to person, place, and time. He displays no tremor. A sensory deficit is present. He exhibits abnormal muscle tone. Coordination abnormal.  Psychiatric: He has a normal mood and affect.  Nursing note and vitals reviewed.   Motor strength is 3 minus over 5 in the right deltoid, biceps, triceps, grip, hip flexor, knee extensor, ankle dorsi flexor plantar flexion 5/5 in left deltoid, biceps, triceps, grip, hip  flexors, knee extensors, ankle dorsi flexor plantar flexion Sensation is absent to light touch in the right upper and right lower limb. Normal in the left upper and left lower limb      Assessment & Plan:   1. Left thalamic infarct with right hemiparesis as well as right hemisensory deficits. He is continued to make some progress with PT and OT home health. We'll continue this and then transition to outpatient therapy.  2. 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 Continue gabapentin  3. Cognitive deficits related to CVA, has had sedation which has gradually improved we'll reduce methylphenidate from 10 mg to 5 g twice a day

## 2014-11-06 NOTE — Patient Instructions (Signed)
Ritalin will be reduced to 5 mg twice a day and then next month will stop this  Gabapentin will be increased to 100 mg 3 times per day this is for nerve pain related to stroke

## 2014-11-13 ENCOUNTER — Other Ambulatory Visit: Payer: Self-pay | Admitting: Physical Medicine & Rehabilitation

## 2014-12-04 ENCOUNTER — Encounter: Payer: Self-pay | Admitting: Physical Medicine & Rehabilitation

## 2014-12-04 ENCOUNTER — Ambulatory Visit (HOSPITAL_BASED_OUTPATIENT_CLINIC_OR_DEPARTMENT_OTHER): Payer: Medicare Other | Admitting: Physical Medicine & Rehabilitation

## 2014-12-04 ENCOUNTER — Encounter: Payer: Medicare Other | Attending: Physical Medicine & Rehabilitation

## 2014-12-04 VITALS — BP 126/89 | HR 92 | Resp 16

## 2014-12-04 DIAGNOSIS — F17210 Nicotine dependence, cigarettes, uncomplicated: Secondary | ICD-10-CM | POA: Insufficient documentation

## 2014-12-04 DIAGNOSIS — I69351 Hemiplegia and hemiparesis following cerebral infarction affecting right dominant side: Secondary | ICD-10-CM | POA: Diagnosis present

## 2014-12-04 DIAGNOSIS — I639 Cerebral infarction, unspecified: Secondary | ICD-10-CM | POA: Diagnosis not present

## 2014-12-04 DIAGNOSIS — I6931 Attention and concentration deficit following cerebral infarction: Secondary | ICD-10-CM | POA: Insufficient documentation

## 2014-12-04 DIAGNOSIS — I69398 Other sequelae of cerebral infarction: Secondary | ICD-10-CM | POA: Diagnosis not present

## 2014-12-04 DIAGNOSIS — G894 Chronic pain syndrome: Secondary | ICD-10-CM | POA: Diagnosis not present

## 2014-12-04 DIAGNOSIS — Z833 Family history of diabetes mellitus: Secondary | ICD-10-CM | POA: Insufficient documentation

## 2014-12-04 DIAGNOSIS — I10 Essential (primary) hypertension: Secondary | ICD-10-CM | POA: Insufficient documentation

## 2014-12-04 NOTE — Progress Notes (Signed)
Subjective:    Patient ID: Michael Goodwin, male    DOB: [birth year], 69 y.o.   MRN: [id number]  HPI Some pain with therapy as well as at night RUE Pain improved with gabapentin Pain Inventory Average Pain 0 Pain Right Now 0 My pain is NA  In the last 24 hours, has pain interfered with the following? General activity 0 Relation with others 0 Enjoyment of life 0 What TIME of day is your pain at its worst? Morning and Night Sleep (in general) NA  Pain is worse with: NA Pain improves with: therapy/exercise Relief from Meds: NA  Mobility walk with assistance use a cane ability to climb steps?  no use a wheelchair needs help with transfers  Function disabled: date disabled 07/04/2014  Neuro/Psych No problems in this area  Prior Studies Any changes since last visit?  no  Physicians involved in your care Any changes since last visit?  no   Family History  Problem Relation Age of Onset  . Diabetes     Social History   Social History  . Marital Status: Widowed    Spouse Name: N/A  . Number of Children: 3  . Years of Education: 7   Social History Main Topics  . Smoking status: Current Every Day Smoker -- 0.20 packs/day    Types: Cigarettes  . Smokeless tobacco: None  . Alcohol Use: No  . Drug Use: No  . Sexual Activity: Not Asked   Other Topics Concern  . None   Social History Narrative   Lives at home with daughter and cousin, brother.   Caffeine use: Coffee occassionally    Past Surgical History  Procedure Laterality Date  . No past surgeries     Past Medical History  Diagnosis Date  . Stroke (HCC)   . Essential hypertension 07/06/2014  . 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 Hemiplegia affecting right dominant side (HCC) 07/06/2014   BP 126/89 mmHg  Pulse 92  Resp 16  SpO2 100%  Opioid Risk Score:   Fall Risk Score:  `1  Depression screen PHQ 2/9  Depression screen PHQ 2/9 10/02/2014  Decreased Interest 3  Down, Depressed,  Hopeless 3  PHQ - 2 Score 6  Altered sleeping 3  Tired, decreased energy 3  Change in appetite 3  Feeling bad or failure about yourself  3  Trouble concentrating 3  Moving slowly or fidgety/restless 3  Suicidal thoughts 0  PHQ-9 Score 24     Review of Systems  Respiratory: Positive for shortness of breath.   All other systems reviewed and are negative.      Objective:   Physical Exam   Eyes: Conjunctivae and EOM are normal. Pupils are equal, round, and reactive to light.  Neck: Normal range of motion.  Neurological: He is alert and oriented to person, place, and time. He displays no tremor. A sensory deficit is present. He exhibits abnormal muscle tone. Coordination abnormal.  Psychiatric: He has a normal mood and affect.  Nursing note and vitals reviewed.   Motor strength is 3 minus over 5 in the right deltoid, biceps, triceps, grip, hip flexor, knee extensor, ankle dorsi flexor plantar flexion 5/5 in left deltoid, biceps, triceps, grip, hip flexors, knee extensors, ankle dorsi flexor plantar flexion Sensation is absent to light touch in the right upper and right lower limb. Normal in the left upper and left lower limb Reduction of light touch in the right facial region intact on the left side  Assessment & Plan:  1. Left thalamic infarct with right hemiparesis as well as right hemisensory deficits. He is continued to make some progress with PT and OT home health. We'll continue this and then transition to outpatient therapy.  2. 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 Continue gabapentin, This has been helpful for his right arm burning pain  3. Cognitive deficits related to CVA, has had sedation which has gradually improved On methylphenidate 5 mg twice a day, We'll finish current prescription and not refill unless he has recurrence of sedation

## 2014-12-04 NOTE — Patient Instructions (Signed)
Please call me if home health therapy is no longer coming out. We'll  set up outpatient therapy

## 2014-12-08 NOTE — Telephone Encounter (Signed)
Error

## 2014-12-12 ENCOUNTER — Encounter: Payer: Self-pay | Admitting: Neurology

## 2014-12-12 ENCOUNTER — Ambulatory Visit (INDEPENDENT_AMBULATORY_CARE_PROVIDER_SITE_OTHER): Payer: Medicare Other | Admitting: Neurology

## 2014-12-12 VITALS — BP 144/92 | HR 63 | Ht 68.0 in | Wt 134.6 lb

## 2014-12-12 DIAGNOSIS — I10 Essential (primary) hypertension: Secondary | ICD-10-CM | POA: Diagnosis not present

## 2014-12-12 DIAGNOSIS — I639 Cerebral infarction, unspecified: Secondary | ICD-10-CM | POA: Diagnosis not present

## 2014-12-12 DIAGNOSIS — F17200 Nicotine dependence, unspecified, uncomplicated: Secondary | ICD-10-CM | POA: Insufficient documentation

## 2014-12-12 DIAGNOSIS — I610 Nontraumatic intracerebral hemorrhage in hemisphere, subcortical: Secondary | ICD-10-CM | POA: Diagnosis not present

## 2014-12-12 DIAGNOSIS — G8190 Hemiplegia, unspecified affecting unspecified side: Secondary | ICD-10-CM

## 2014-12-12 NOTE — Progress Notes (Signed)
STROKE NEUROLOGY FOLLOW UP NOTE  NAME: Michael Goodwin DOB: 03-13-1945  REASON FOR VISIT: stroke follow up HISTORY FROM: daughter and pt and chart  Today we had the pleasure of seeing [name] in follow-up at our Neurology Clinic. Pt was accompanied by daughter and grandson.   History Summary Mr. [name] is a 69 y.o. male with history of HTN and current smoker was admitted on 07/04/14 due to fall, confusion and right side weakness. CT showed left BG hemorrhage with ventricular extension. Repeat CT showed stable hematoma without hydrocephalus. BP control with medication. Stroke work up including CUS, TTE, LDL and A1C negative. After stabilization, he was discharged to CIR.   09/05/14 follow up (AA) - pt has been doing well with therapy. He can walk across the living room with assistance. He lives at home with daughter who provides most information. He always has family around him 24 hours a day. Physical therapy coming to the house. Speech is coming. He is eating better now that his diet is not puree. Weakness has improved, he has good and bad days. But they can say significantly improved. Daughter feels he can see more, vision improved. Daughter and family gives his medications. Blood pressure has been excellent in the 120s/70s.   Interval History During the interval time, the patient has been doing the same. Recovery seems at plateau. He has HH twice per week, but after that he did not do much home exercise. He is less motivated as before as per daughter. Denies depression. Still smoking. BP today 144/92 but stated good control at home 120-130. Still has right side weakness, able to walk with semi-walker but need assistance. Right arm not able to function well yet.    REVIEW OF SYSTEMS: Full 14 system review of systems performed and notable only for those listed below and in HPI above, all others are negative:  Constitutional:   Cardiovascular:  Ear/Nose/Throat:   Skin:  Eyes:     Respiratory:   Gastroitestinal:   Genitourinary:  Hematology/Lymphatic:   Endocrine:  Musculoskeletal:   Allergy/Immunology:   Neurological:   Psychiatric:  Sleep:   The following represents the patient's updated allergies and side effects list: No Known Allergies  The neurologically relevant items on the patient's problem list were reviewed on today's visit.  Neurologic Examination  A problem focused neurological exam (12 or more points of the single system neurologic examination, vital signs counts as 1 point, cranial nerves count for 8 points) was performed.  Blood pressure 144/92, pulse 63, height  (1.727 m), weight 134 lb 9.6 oz (61.054 kg).  General - Well nourished, well developed, in no apparent distress.  Ophthalmologic - Fundi not visualized due to eye movement.  Cardiovascular - Regular rate and rhythm with no murmur.  Mental Status -  Level of arousal and orientation to time, place, and person were intact. Language including expression, naming, repetition, comprehension was assessed and found intact, but mild dysarthria.  Cranial Nerves II - XII - II - Visual field intact OU. III, IV, VI - Extraocular movements intact. V - Facial sensation decreased on the right, about 20% of left. VII - right mild facial droop. VIII - Hearing & vestibular intact bilaterally. X - Palate elevates symmetrically. XI - Chin turning & shoulder shrug intact bilaterally. XII - Tongue protrusion intact.  Motor Strength - The patient's strength was 3/5 RUE proximal and 4-/5 distally, 4+/5 RLE. LUE and LLE 5/5. Bulk was normal and fasciculations were absent.  Motor Tone - Muscle tone was assessed at the neck and appendages and was normal.  Reflexes - The patient's reflexes were 1+ in all extremities and he had no pathological reflexes.  Sensory - Light touch, temperature/pinprick were assessed and were normal.    Coordination - The patient had normal movements in the hands,  with no ataxia or dysmetria , but slow on the right.  Tremor was absent.  Gait and Station - not tested and pt in wheelchair.  Data reviewed: I personally reviewed the images and agree with the radiology interpretations.  Ct Head Wo Contrast 07/05/2014 Evolving LEFT thalamic 3.3 x 2.3 cm hematoma with intraventricular extension. 4 mm LEFT-to-RIGHT midline shift. Mild hydrocephalus suspected. Moderate white matter changes suggest chronic small vessel ischemic disease.  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   Carotid Doppler Mild to moderate plaque carotid bifurcations bilaterally. ICA velocities and ratios within the 1-39% range of stenosis bilaterally. Vertebral arteries are patent and antegrade bilaterally.  2D Echocardiogram  - Left ventricle: The cavity size was normal. Wall thickness wasincreased in a pattern of moderate LVH. The estimated ejectionfraction was 60%. Wall motion was normal; there were no regionalwall motion abnormalities. - Aortic valve: Sclerosis without stenosis. There was trivialregurgitation. - Aorta: There is significant dilitation of the aortic root andascending aorta. Aortic root dimension: 48 mm (ED). Ascendingaortic diameter: 50 mm (S). This needs to be followed. - Right ventricle: The cavity size was normal. Systolic functionwas normal.  EKG normal sinus rhythm. For complete results please see formal report.  Component     Latest Ref Rng 07/05/2014  Cholesterol     0 - 200 mg/dL 161162  Triglycerides     <150 mg/dL 62  HDL Cholesterol     >40 mg/dL 95  Total CHOL/HDL Ratio      1.7  VLDL     0 - 40 mg/dL 12  LDL (calc)     0 - 99 mg/dL 55  Hemoglobin [id number]     4.8 - 5.6 % 5.8 (H)  Mean Plasma Glucose      120  TSH     0.350 - 4.500 uIU/mL 1.554    Assessment: As you may  recall, he is a 69 y.o. African American male with PMH of HTN and current smoker was admitted on 07/04/14 due to left BG hemorrhage with ventricular extension. Repeat CT showed stable hematoma without hydrocephalus. Stroke work up including CUS, TTE, LDL and A1C negative. After stabilization, he was discharged to CIR. During the interval time, BP in good control. Put on ASA for stroke prevention. He had initial muscle strength improvement, but now lack of further improvement due to lack of self exercise. Encourage pt aggressive therapy and exercise and quit smoking.  Plan:  - continue ASA for stroke prevention - check BP at home and record - Follow up with your primary care physician for stroke risk factor modification. Recommend maintain blood pressure goal <130/80, diabetes with hemoglobin A1c goal below 6.5% and lipids with LDL cholesterol goal below 70 mg/dL.  - aggressive PT/OT and home exercise - quit smoking - follow up in 4 months  I spent more than 25 minutes of face to face time with the patient. Greater than 50% of time was spent in counseling and coordination of care. We have discussed about medication compliance, smoking cessation, aggressive home exercise within window.  No orders of the defined types were placed in this encounter.    Meds ordered this encounter  Medications  . aspirin EC 81 MG tablet    Sig: Take by mouth.    Patient Instructions  - continue ASA for stroke prevention - check BP at home and record - Follow up with your primary care physician for stroke risk factor modification. Recommend maintain blood pressure goal <130/80, diabetes with hemoglobin A1c goal below 6.5% and lipids with LDL cholesterol goal below 70 mg/dL.  - aggressive PT/OT and home exercise - quit smoking - follow up in 4 months   Marvel Plan, MD PhD [facility] [address] [place], [place] [address] [phone]

## 2014-12-12 NOTE — Patient Instructions (Signed)
-   continue ASA for stroke prevention - check BP at home and record - Follow up with your primary care physician for stroke risk factor modification. Recommend maintain blood pressure goal <130/80, diabetes with hemoglobin A1c goal below 6.5% and lipids with LDL cholesterol goal below 70 mg/dL.  - aggressive PT/OT and home exercise - quit smoking - follow up in 4 months

## 2015-01-22 ENCOUNTER — Encounter: Payer: Medicare Other | Attending: Physical Medicine & Rehabilitation

## 2015-01-22 ENCOUNTER — Ambulatory Visit (HOSPITAL_BASED_OUTPATIENT_CLINIC_OR_DEPARTMENT_OTHER): Payer: Medicare Other | Admitting: Physical Medicine & Rehabilitation

## 2015-01-22 ENCOUNTER — Encounter: Payer: Self-pay | Admitting: Physical Medicine & Rehabilitation

## 2015-01-22 VITALS — BP 108/82 | HR 102

## 2015-01-22 DIAGNOSIS — G8191 Hemiplegia, unspecified affecting right dominant side: Secondary | ICD-10-CM | POA: Diagnosis not present

## 2015-01-22 DIAGNOSIS — I69398 Other sequelae of cerebral infarction: Secondary | ICD-10-CM | POA: Insufficient documentation

## 2015-01-22 DIAGNOSIS — I69351 Hemiplegia and hemiparesis following cerebral infarction affecting right dominant side: Secondary | ICD-10-CM | POA: Diagnosis not present

## 2015-01-22 DIAGNOSIS — Z833 Family history of diabetes mellitus: Secondary | ICD-10-CM | POA: Diagnosis not present

## 2015-01-22 DIAGNOSIS — I610 Nontraumatic intracerebral hemorrhage in hemisphere, subcortical: Secondary | ICD-10-CM

## 2015-01-22 DIAGNOSIS — I10 Essential (primary) hypertension: Secondary | ICD-10-CM | POA: Insufficient documentation

## 2015-01-22 DIAGNOSIS — G894 Chronic pain syndrome: Secondary | ICD-10-CM | POA: Diagnosis not present

## 2015-01-22 DIAGNOSIS — I639 Cerebral infarction, unspecified: Secondary | ICD-10-CM

## 2015-01-22 DIAGNOSIS — I6931 Attention and concentration deficit following cerebral infarction: Secondary | ICD-10-CM | POA: Insufficient documentation

## 2015-01-22 DIAGNOSIS — F17210 Nicotine dependence, cigarettes, uncomplicated: Secondary | ICD-10-CM | POA: Insufficient documentation

## 2015-01-22 NOTE — Progress Notes (Signed)
Subjective:    Patient ID: Michael Goodwin, male    DOB: 07-19-1945, 68 y.o.   MRN: [id number] L thalamic infarct May 26,2016 HPI Has only one home health visit left. Home health therapist recommending outpatient therapy. Patient lives in [place]. Patient still has some shoulder pain on the right side but only with overhead activities. The gabapentin helped with his constant shoulder pain  Pain Inventory Average Pain 0 Pain Right Now 0 My pain is NA  In the last 24 hours, has pain interfered with the following? General activity 3 Relation with others 0 Enjoyment of life 0 What TIME of day is your pain at its worst? NA Sleep (in general) Good  Pain is worse with: bending and some activites Pain improves with: NA Relief from Meds: 2  Mobility walk with assistance use a cane how many minutes can you walk? 1-2 ability to climb steps?  yes do you drive?  no use a wheelchair needs help with transfers Do you have any goals in this area?  no  Function disabled: date disabled NA  Neuro/Psych numbness trouble walking loss of taste or smell  Prior Studies Any changes since last visit?  no  Physicians involved in your care Any changes since last visit?  no   Family History  Problem Relation Age of Onset  . Diabetes    . Stroke Brother    Social History   Social History  . Marital Status: Widowed    Spouse Name: N/A  . Number of Children: 3  . Years of Education: 7   Social History Main Topics  . Smoking status: Current Every Day Smoker -- 0.20 packs/day    Types: Cigarettes  . Smokeless tobacco: None  . Alcohol Use: No  . Drug Use: No  . Sexual Activity: Not Asked   Other Topics Concern  . None   Social History Narrative   Lives at home with daughter and cousin, brother.   Caffeine use: Coffee occassionally    Past Surgical History  Procedure Laterality Date  . No past surgeries     Past Medical History  Diagnosis Date  . Stroke  (HCC)   . Essential hypertension 07/06/2014  . 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 Hemiplegia affecting right dominant side (HCC) 07/06/2014   BP 108/82 mmHg  Pulse 102  SpO2 98%  Opioid Risk Score:   Fall Risk Score:  `1  Depression screen PHQ 2/9  Depression screen PHQ 2/9 10/02/2014  Decreased Interest 3  Down, Depressed, Hopeless 3  PHQ - 2 Score 6  Altered sleeping 3  Tired, decreased energy 3  Change in appetite 3  Feeling bad or failure about yourself  3  Trouble concentrating 3  Moving slowly or fidgety/restless 3  Suicidal thoughts 0  PHQ-9 Score 24     Review of Systems  Constitutional: Positive for unexpected weight change.  Musculoskeletal: Positive for gait problem.  Neurological: Positive for numbness.  Psychiatric/Behavioral:       Loss of taste or smell  All other systems reviewed and are negative.      Objective:   Physical Exam  Constitutional: He appears well-developed and well-nourished.  HENT:  Head: Atraumatic.  Eyes: Conjunctivae and EOM are normal. Pupils are equal, round, and reactive to light.  Neurological: He is alert.  Nursing note and vitals reviewed.  Motor strength is 4/5 in the right deltoid, biceps, triceps, grip, right hip flexor and knee extensor and ankle dorsal flexor  plantar flexor  Sensation is reduced to light touch In the right hand.  There is no pain with right shoulder range of motion. Reduced range of motion with external rotation as well as abduction and forward flexion right shoulder, approximately one half range  Mood and affect are appropriate         Assessment & Plan:  1. Left thalamic stroke with right hemiparesis he still has severe right hemisensory deficits and  Moderately severe cognitive deficits.I agree with Outpatient PT, OT, [facility].  Follow-up in ~6 weeks  No need for right shoulder injection at the current time  Patient has  neurology follow-up in approximately 3 month

## 2015-01-31 ENCOUNTER — Encounter: Payer: Self-pay | Admitting: Physical Therapy

## 2015-01-31 ENCOUNTER — Ambulatory Visit: Payer: Medicare Other | Attending: Physical Medicine & Rehabilitation

## 2015-01-31 VITALS — BP 100/70 | HR 87

## 2015-01-31 DIAGNOSIS — R5383 Other fatigue: Secondary | ICD-10-CM | POA: Insufficient documentation

## 2015-01-31 DIAGNOSIS — R262 Difficulty in walking, not elsewhere classified: Secondary | ICD-10-CM | POA: Diagnosis present

## 2015-01-31 DIAGNOSIS — I69359 Hemiplegia and hemiparesis following cerebral infarction affecting unspecified side: Secondary | ICD-10-CM | POA: Diagnosis present

## 2015-01-31 NOTE — Patient Instructions (Signed)
SIT TO STAND: Feet Narrow    Place feet apart. Lean chest forward. Raise hips and straighten knees to stand. 5___ reps per set, _2__ sets per session, twice/day. Keep a chair in front or a stable surface for safety. Perform with supervision.   Seated Alternating Leg Raise (Marching)    Sit on regular chair (not on ball as picture). Raise bent knee and return. Repeat with other leg. Perform with ankle weights or band around knees. Do 10-15 repetitions, 2 sets/session, twice/day.  KNEE: Extension, Long Arc Quad (Weight)    Place weight around leg. Raise leg until knee is straight. Hold _3__ seconds. Use _2__ lb weight. _10-15__ reps per set, 2 sets/session, 2 session/day.   HIP: Abduction / External Rotation (Band)    Place band around knees. Sit in chair. Push knees away from each other and squeeze your bottom muscles (gluts). Hold for 3 seconds. Perform 10-15 times. 2 sets/session, 2 sessions/day.

## 2015-02-01 NOTE — Therapy (Signed)
[facility] MAIN [facility] [address] [place], [place], [address] Phone: [phone]   Fax:  [phone]  Physical Therapy Evaluation  Patient Details  Name: Michael ArbourClyde [facility] MRN: [id number] Date of Birth: 1945/09/25 Referring Provider: Claudette LawsKirsteins, Andrew  Encounter Date: 01/30/69      PT End of Session - 02/01/15 1209    Visit Number 1   Number of Visits 17   Date for PT Re-Evaluation 03/30/15   Authorization Type g codes every 10th visit/30 days   PT Start Time 1630   PT Stop Time 1730   PT Time Calculation (min) 60 min   Equipment Utilized During Treatment Gait belt   Activity Tolerance Patient tolerated treatment well   Behavior During Therapy [facility] for tasks assessed/performed      Past Medical History  Diagnosis Date  . Stroke (HCC)   . Essential hypertension 07/06/2014  . 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 Hemiplegia affecting right dominant side (HCC) 07/06/2014    Past Surgical History  Procedure Laterality Date  . No past surgeries      Filed Vitals:   01/31/15 1635  BP: 100/70  Pulse: 87  SpO2: 98%    Visit Diagnosis:  Difficulty walking - Plan: PT plan of care cert/re-cert  Hemiparesis affecting dominant side as late effect of cerebrovascular accident [facility](HCC) - Plan: PT plan of care cert/re-cert      Subjective Assessment - 01/31/15 1632    Subjective CVA 07/06/14 with R sided weakness   Pertinent History Pt sufferred a L thalamic infarct May 26,2016 with weakness in RUE/RLE as well as facial weakness. Pt also complains of R sided numbness/tingling. [facility] and he was discharged directly home. Pt did not discharge to SNF but once home he has been receiving HH PT. His last PT session was was last week and even since ending therapy his daughter reports he seems like he has gotten weaker. Pt was living in his own home but daughter moved him into her house so  that he could receive better care. Pt reports that he has the most difficulty with transfers and long distance ambulation.   Limitations Walking   How long can you walk comfortably? a few hundred feet   Patient Stated Goals To improve independent transfers, ambulation, and bathing.    Currently in Pain? No/denies            [facility] PT Assessment - 02/01/15 1021    Assessment   Medical Diagnosis CVA   Referring Provider Claudette LawsKirsteins, Andrew   Onset Date/Surgical Date 07/06/14   Hand Dominance Right   Next MD Visit --  Unable to remember   [facility]   Precautions   Precautions Fall   Restrictions   Weight Bearing Restrictions No   Balance Screen   Has the patient fallen in the past 6 months Yes   How many times? 1   Has the patient had a decrease in activity level because of a fear of falling?  Yes   Is the patient reluctant to leave their home because of a fear of falling?  Yes   Home Environment   Living Environment Private residence   Living Arrangements Children;Other relatives  Patient's brother, 3 grandchildren   Available Help at Discharge Family   Type of Home House   Home Access Stairs to enter   Entrance Stairs-Number of Steps  4   Entrance Stairs-Rails Can reach both   Home Layout One level   Home Equipment Bedside commode;Wheelchair - Building services engineer   Prior Function   Level of Independence Needs assistance with ADLs;Needs assistance with homemaking;Needs assistance with gait;Needs assistance with transfers   Vocation Retired   [employer]   Overall Cognitive Status Impaired/Different from baseline   Area of Impairment Safety/judgement;Orientation   Orientation Level Disoriented Occupational hygienist and Judgement Comments Disoriented to [employer] Unable to provide name of president. Pt with slurring and stuttering of speech. Daughter reports that slurring of speech occurred after stroke but stuttering is basline. Pt denies dysphagia  or choking.   Observation/Other Assessments   Observations Decreased muscle mass in bilateral LEs, slightly more atrophy in R calp compared to L. RUE in flexed posturing position. AC joint step off with mild subluxation in R shoulder, increased gapping with distraction   Sensation   Light Touch Impaired by gross assessment   Additional Comments Attempted dermatome testing but pt unable to provide consistent feedback so terminated   Coordination   Gross Motor Movements are Fluid and Coordinated No   Fine Motor Movements are Fluid and Coordinated No   Coordination and Movement Description Ataxia in RUE noted, Pt with RUE weakness as well   Posture/Postural Control   Posture Comments RUE flexed posturing. Otherwise no gross abnormlaities in sitting posture with the exception of mild R shoulder subluxation   Tone   Assessment Location Right Upper Extremity;Right Lower Extremity   ROM / Strength   AROM / PROM / Strength Strength   Strength   Overall Strength Deficits   Overall Strength Comments R shoulder AROM flexion 60 degrees, PROM: 80 degrees, R shoulder AROM/PROM abduction 80 degrees. Full elbow and wrist flexion/extension with increased end range tone in elbow extension but with increased time able to reach full extension. RLE DF at least 5 degrees above neutral, R knee flexion extension AROM/PROM is full. R shoulder flexion: 2+/5, abduction: 3+/5, 4-/5 R elbow flexion/extension, 3+/5 R wrist extension, 2+/5 R wrist flexion. Decreased R grip strength. LUE is grossly 4 to 4+/5 throughout with grip strength WNL. R hip flexion 3+/5, R knee extension/flexion 4+/5. Pt demosntrates 4+ R ankle DF/PF/In/Ev when tested in sitting. Functionally pt presents with increased R foot drop in AFO with increased ambulation distance. R hip abduction strength is 3+/5. LLE strength is grossly 4+ to 5/5 throughout.    Transfers   Transfers Sit to Stand   Sit to Stand With upper extremity assist;4: Min guard    Comments Unable to perform sit to stand from regular height chair without UE assist   Standardized Balance Assessment   Standardized Balance Assessment Timed Up and Go Test;10 meter walk test   10 Meter Walk 26.8 = 0.37 m/s  2 stumbles during ambulation. Worsening toe drag   Timed Up and Go Test   TUG Normal TUG   Normal TUG (seconds) 31.48   RUE Tone   RUE Tone Hypertonic   RUE Tone   Hypertonic Details 3 R elbow flexion spasticity, 2 R elbow extension spasticity. Mild finger flexor spasticity noted. No spasticity noted. No UE clonus present   Modified Ashworth Scale for Grading Hypertonia RUE --   RLE Tone   RLE Tone Hypertonic   RLE Tone   Hypertonic Details 1+ R HS tone. No LE spasticity or clonus present       TREATMENT  THER-EX 2# ankle weight: seated marches, seated LAQ 2  x 10 each; Sit to stand without UE support x 5; Seated RTB clams x 10; Written HEP provided and discussed;                    PT Education - 2015-02-19 1208    Education provided Yes   Education Details HEP, plan of care   Person(s) Educated Patient;Child(ren)   Methods Explanation;Demonstration;Tactile cues;Verbal cues;Handout   Comprehension Verbalized understanding;Returned demonstration;Verbal cues required;Tactile cues required             PT Long Term Goals - 02-19-2015 1216    PT LONG TERM GOAL #1   Title Pt will be independent and compliant with HEP in order to improve strength, transfers, and balance   Status New   PT LONG TERM GOAL #2   Title Pt will demonstrate increase in gait speed by at least 0.14 m/s in order to demonstrate clinically significant improvement in speed to improve function at home.   Baseline 2015-02-19: 26.8 = 0.37 m/s   Status New   PT LONG TERM GOAL #3   Title Pt wil decrease TUG by at least 2.9 seconds in order to demonstrate improved LE strength and balance in order to decrease falls.    Baseline 2015/02/19: 31.48 seconds   Status New                Plan - 2015/02/19 1210    Clinical Impression Statement Pt is a pleasant 69 year-old AA male who sufferred a L thalamic infarct on 07/06/14 with RUE/LUE weakness. PT evaluation reveals R sided weakness with difficulty performing transfers and ambulation. TUG and 45m gait speed are both outside of normal values and place pt at increased risk for falls. Pt will benefit from skilled PT services for strengthening, balance, and functional mobility training in order to improve function at home and decrease risk for falls.    Pt will benefit from skilled therapeutic intervention in order to improve on the following deficits Abnormal gait;Decreased balance;Decreased cognition;Decreased knowledge of precautions;Decreased mobility;Decreased range of motion;Decreased safety awareness;Decreased strength;Difficulty walking;Hypomobility;Impaired tone;Impaired UE functional use   Rehab Potential Good   Clinical Impairments Affecting Rehab Potential Positive: motivation, family suppport, current progress, Negative: decreased compliance with HEP, RUE/RLE weakness   PT Frequency 2x / week   PT Duration 8 weeks   PT Treatment/Interventions ADLs/Self Care Home Management;Aquatic Therapy;Biofeedback;Electrical Stimulation;Iontophoresis 4mg /ml Dexamethasone;Traction;Ultrasound;Contrast Bath;DME Instruction;Gait training;Stair training;Functional mobility training;Therapeutic activities;Therapeutic exercise;Balance training;Neuromuscular re-education;Cognitive remediation;Patient/family education;Orthotic Fit/Training;Manual techniques;Energy conservation   PT Next Visit Plan , Perform balance outcome measure and update goals as necessary   PT Home Exercise Plan sit to stand, seated marches, seated LAQ, seated clams   Recommended Other Services OT for RUE strengthening. To be scheduled   Consulted and Agree with Plan of Care Patient;Family member/caregiver   Family Member Consulted Daughter           G-Codes - 2015-02-19 1224    Functional Assessment Tool Used TUG, 29m gait speed, clinical judgement   Functional Limitation Mobility: Walking and moving around   Mobility: Walking and Moving Around Current Status [phone]) At least 80 percent but less than 100 percent impaired, limited or restricted   Mobility: Walking and Moving Around Goal Status [phone]) At least 40 percent but less than 60 percent impaired, limited or restricted       Problem List Patient Active Problem List   Diagnosis Date Noted  . Tobacco use disorder 12/12/2014  . Hemiparesis (HCC) 12/12/2014  . Basal ganglia hemorrhage (  HCC) 12/12/2014  . Chronic pain syndrome 12/04/2014  . Alterations of sensations following CVA (cerebrovascular accident) 07/10/2014  . Acute respiratory failure with hypoxia (HCC) 07/07/2014  . Abnormal urinalysis 07/07/2014  . Hypokalemia 07/07/2014  . Dehydration 07/07/2014  . Metabolic encephalopathy 07/07/2014  . Aortic root dilatation (48 mm May 2016) 07/07/2014  . 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 ICH (intracerebral hemorrhage) (HCC) 07/07/2014  . 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 Hemiplegia affecting right dominant side (HCC) 07/06/2014  . Cognitive deficit due to recent stroke 07/06/2014  . IVH (intraventricular hemorrhage) (HCC) 07/06/2014  . Essential hypertension 07/06/2014  . Intracranial hemorrhage (HCC)    Lynnea Maizes PT, DPT   Huprich,Jason 02/01/2015, 12:26 PM  [place] [facility] MAIN [facility] SERVICES [address] [place], [place], [address] Phone: [phone]   Fax:  [phone]  Name: Yusef Lamp MRN: [id number] Date of Birth: [birth year]

## 2015-02-07 ENCOUNTER — Ambulatory Visit: Payer: Medicare Other | Admitting: Occupational Therapy

## 2015-02-07 ENCOUNTER — Encounter: Payer: Self-pay | Admitting: Physical Therapy

## 2015-02-07 ENCOUNTER — Encounter: Payer: Self-pay | Admitting: Occupational Therapy

## 2015-02-07 ENCOUNTER — Ambulatory Visit: Payer: Medicare Other | Admitting: Physical Therapy

## 2015-02-07 DIAGNOSIS — R5383 Other fatigue: Secondary | ICD-10-CM

## 2015-02-07 DIAGNOSIS — R262 Difficulty in walking, not elsewhere classified: Secondary | ICD-10-CM

## 2015-02-07 DIAGNOSIS — I69359 Hemiplegia and hemiparesis following cerebral infarction affecting unspecified side: Secondary | ICD-10-CM

## 2015-02-07 NOTE — Therapy (Signed)
[place] [facility] [address] [place], [place], [address] Phone: [phone]   Fax:  [phone]  Physical Therapy Treatment  Patient Details  Name: Michael ArbourClyde [facility] MRN: [id number] Date of Birth: [birth year] Referring Provider: Claudette LawsKirsteins, Andrew  Encounter Date: 02/07/2015      PT End of Session - 02/07/15 1745    Visit Number 2   Number of Visits 17   Date for PT Re-Evaluation 03/30/15   Authorization Type 2   Authorization Time Period 10   PT Start Time 1630   PT Stop Time 1715   PT Time Calculation (min) 45 min   Equipment Utilized During Treatment Gait belt   Activity Tolerance Patient tolerated treatment well   Behavior During Therapy [facility] for tasks assessed/performed      Past Medical History  Diagnosis Date  . Stroke (HCC)   . Essential hypertension 07/06/2014  . 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 Hemiplegia affecting right dominant side (HCC) 07/06/2014    Past Surgical History  Procedure Laterality Date  . No past surgeries      There were no vitals filed for this visit.  Visit Diagnosis:  Difficulty walking  Lethargy      Subjective Assessment - 02/07/15 1638    Subjective Patient reports doing okay today; He denies any pain; He denies any new falls; Patient reports using a hemiwalker at home for gait tasks.    Pertinent History Pt sufferred a L thalamic infarct May 26,2016 with weakness in RUE/RLE as well as facial weakness. Pt also complains of R sided numbness/tingling. [facility] and he was discharged directly home. Pt did not discharge to SNF but once home he has been receiving HH PT. His last PT session was was last week and even since ending therapy his daughter reports he seems like he has gotten weaker. Pt was living in his own home but daughter moved him into her house so that he could receive better care. Pt reports that he has the most  difficulty with transfers and long distance ambulation.   Limitations Walking   How long can you walk comfortably? a few hundred feet   Patient Stated Goals To improve independent transfers, ambulation, and bathing.    Currently in Pain? No/denies            [facility] Assessment - 02/07/15 1646    6 Minute Walk- Baseline   BP (mmHg) 108/68 mmHg   HR (bpm) 109   02 Sat (%RA) 100 %   6 Minute walk- Post Test   BP (mmHg) 136/82 mmHg   HR (bpm) 145   02 Sat (%RA) 100 %   6 minute walk test results    Aerobic Endurance Distance Walked 160   Endurance additional comments with hemiwalker, min A, patient required rest break after 100 feet (3 min); limited ambulator; significantly less than age group norms of 1600 feet;        TREATMENT: Warm up on Nustep level 2 BUE/BLE (with right hand attachment) x5 min (unbilled);  PT instructed patient in 6 min walk test with hemiwalker, see above;  Quantum leg press: BLE 75# x10; RLE only 45# 2x10; Patient required mod VCs to slow down eccentric return for better LE strengthening; PT also instructed patient to increase hip abduction to reduce RLE knee valgus during single leg, leg press;  Standing hip abduction x10 bilaterally   Patient ambulated with  hemiwalker in LUE with min-mod A 15 feet x2, 30 feet x3, and during 6 min walk test; Patient required mod VCs to increase LLE step length for better reciprocal gait; He also required min Vcs to increase erect posture, slow down gait for better balance and to increase base of support for better gait safety;                         PT Education - 02/07/15 1742    Education provided Yes   Education Details LE strengthening, gait safety;   Person(s) Educated Patient   Methods Explanation;Verbal cues   Comprehension Verbalized understanding;Returned demonstration;Verbal cues required             PT Long Term Goals - 02/07/15 1750    PT LONG TERM GOAL #1   Title Pt will  be independent and compliant with HEP in order to improve strength, transfers, and balance   Time 8   Period Weeks   Status New   PT LONG TERM GOAL #2   Title Pt will demonstrate increase in gait speed by at least 0.14 m/s in order to demonstrate clinically significant improvement in speed to improve function at home.   Baseline 02/01/15: 26.8 = 0.37 m/s   Time 8   Period Weeks   Status New   PT LONG TERM GOAL #3   Title Pt wil decrease TUG by at least 2.9 seconds in order to demonstrate improved LE strength and balance in order to decrease falls.    Baseline 02/01/15: 31.48 seconds   Time 8   Period Weeks   Status New               Plan - 02/07/15 1746    Clinical Impression Statement Instructed patient in gait safety utilizing hemi-walker. Patient requires min-mod A for gait safety and mod Vcs for correct gait techniques. He requires sitting rest break during 6 min walk test. Patient was also instructed in LE strengthening. He would benefit from additional skilled PT intervention to improve LE strength, balance and gait safety;    Pt will benefit from skilled therapeutic intervention in order to improve on the following deficits Abnormal gait;Decreased balance;Decreased cognition;Decreased knowledge of precautions;Decreased mobility;Decreased range of motion;Decreased safety awareness;Decreased strength;Difficulty walking;Hypomobility;Impaired tone;Impaired UE functional use   Rehab Potential Good   Clinical Impairments Affecting Rehab Potential Positive: motivation, family suppport, current progress, Negative: decreased compliance with HEP, RUE/RLE weakness   PT Frequency 2x / week   PT Duration 8 weeks   PT Treatment/Interventions ADLs/Self Care Home Management;Aquatic Therapy;Biofeedback;Electrical Stimulation;Iontophoresis /ml Dexamethasone;Traction;Ultrasound;Contrast Bath;DME Instruction;Gait training;Stair training;Functional mobility training;Therapeutic  activities;Therapeutic exercise;Balance training;Neuromuscular re-education;Cognitive remediation;Patient/family education;Orthotic Fit/Training;Manual techniques;Energy conservation   PT Next Visit Plan , Perform balance outcome measure and update goals as necessary   PT Home Exercise Plan sit to stand, seated marches, seated LAQ, seated clams   Consulted and Agree with Plan of Care Patient;Family member/caregiver   Family Member Consulted Daughter        Problem List Patient Active Problem List   Diagnosis Date Noted  . Tobacco use disorder 12/12/2014  . Hemiparesis (HCC) 12/12/2014  . 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 Chronic pain syndrome 12/04/2014  . Alterations of sensations following CVA (cerebrovascular accident) 07/10/2014  . Acute respiratory failure with hypoxia (HCC) 07/07/2014  . Abnormal urinalysis 07/07/2014  . Hypokalemia 07/07/2014  . Dehydration 07/07/2014  . Metabolic encephalopathy 07/07/2014  . Aortic root dilatation (48 mm May  2016) 07/07/2014  . 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 ICH (intracerebral hemorrhage) (HCC) 07/07/2014  . 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 Hemiplegia affecting right dominant side (HCC) 07/06/2014  . Cognitive deficit due to recent stroke 07/06/2014  . IVH (intraventricular hemorrhage) (HCC) 07/06/2014  . Essential hypertension 07/06/2014  . Intracranial hemorrhage (HCC)     Hagan Maltz PT, DPT 02/07/2015, 5:56 PM  [place] [facility] MAIN [facility] SERVICES [address] [place], [place], [address] Phone: [phone]   Fax:  [phone]  Name: Keymani Mclean MRN: [id number] Date of Birth: 26-Oct-1945

## 2015-02-07 NOTE — Therapy (Signed)
[place] MAIN [facility] SERVICES [address] [place], [place], [address] Phone: [phone]   Fax:  [phone]  Occupational Therapy Evaluation  Patient Details  Name: Michael Goodwin MRN: [id number] Date of Birth: 1945-09-05 Referring Provider: [name]  Encounter Date: 02/07/2015      OT End of Session - 02/07/15 1632    Visit Number 1   Number of Visits 24   Date for OT Re-Evaluation 05/02/15   Authorization Type medicare   OT Start Time 1526   OT Stop Time 1630   OT Time Calculation (min) 64 min   Equipment Utilized During Treatment Stroke test kit.   Behavior During Therapy [facility] for tasks assessed/performed      Past Medical History  Diagnosis Date  . Stroke ([place])   . Essential hypertension 07/06/2014  . Nontraumatic thalamic hemorrhage ([place]) 07/06/2014  . Hemiplegia affecting right dominant side ([place]) 07/06/2014    Past Surgical History  Procedure Laterality Date  . No past surgeries      There were no vitals filed for this visit.  Visit Diagnosis:  Hemiparesis affecting dominant side as late effect of cerebrovascular accident [facility]) - Plan: Ot plan of care cert/re-cert      Subjective Assessment - 02/07/15 1538    Subjective  Patient reports that his arm and leg are his biggest problem.   Pertinent History This patient is a 69 year old male who came to [facility] out patient after suffering a stroke (L thalamic infarct) on May 26th 2016. It resulted in R UE and LE weakness and facial weakness. He also demonstrates sensory loss  Patient spent one month at [facility] before going home. He recieved home health therapy. Daughter reports patient weakness worse since stoping home healt.He had been living alone, but now daughter lives at his daughter's house.,  He needs assist  with dressing, bathing, shaving, and toileting.      Limitations Walking, ADL, R UE function.   Patient Stated Goals To be able to walk better and do things  "for my self."   Currently in Pain? No/denies    Instructed and practiced activities to aid in making fingers straight as they are contracted at PIP joints (measurements in flow sheet). Patient demonstrated ability to complete this as a home program.       [facility] OT Assessment - 02/07/15 0001    Assessment   Diagnosis 681.91   Referring Provider [name]   Onset Date 07/06/14   Prior Therapy Home   Precautions   Precautions Fall   Restrictions   Weight Bearing Restrictions No   Balance Screen   Has the patient fallen in the past 6 months Yes   How many times? 1   Has the patient had a decrease in activity level because of a fear of falling?  Yes   Is the patient reluctant to leave their home because of a fear of falling?  No   Home  Environment   Family/patient expects to be discharged to: Private residence   Living Arrangements Children   Available Help at Discharge Family   Type of Ponder Shower/Tub Walk-in Shower   ADL   Eating/Feeding Set up   Upper Body Bathing Minimal assistance   Lower Body Bathing Moderate assistance   Lower Body Dressing Minimal assistance  Dependent for fasteners   Toileting - Clothing Manipulation Other (comment)  Assist with fasteners and contact guard.   IADL  Prior Level of Function Meal Prep independent   Meal Prep Needs to have meals prepared and served   Written Expression   Dominant Hand Right   Handwriting Not legible   Sensation   Light Touch Impaired by gross assessment   Stereognosis Impaired Detail   Stereognosis Impaired Details Impaired RUE   Hot/Cold Impaired by gross assessment   Coordination   Gross Motor Movements are Fluid and Coordinated No   Fine Motor Movements are Fluid and Coordinated No   9 Hole Peg Test --  Left 31 seconds right unable   AROM   Overall AROM Comments elbow flexion and extension forearm supination/pronation WFL   Right Shoulder Flexion 50 Degrees   Strength   Overall  Strength Comments All other gross strength is WNL   Right Shoulder Flexion 3-/5   Right Elbow Flexion 5/5   Right Elbow Extension 4-/5   Right Forearm Pronation 4+/5   Right Forearm Supination 3+/5   Right Wrist Flexion 4+/5   Right Wrist Extension 4-/5   Right Hand Grip (lbs) 12   Left Hand Grip (lbs) 86   Right Hand AROM   R Index PIP 0-100 -30 Degrees   R Long PIP 0-100 -30 Degrees   R Ring PIP 0-100 -30 Degrees   R Little PIP 0-100 -34 Degrees    L UE                      OT Education - 02/07/15 1630    Education provided Yes   Education Details Educated on purpose of occupational therapy.   Person(s) Educated Patient   Methods Explanation   Comprehension Verbalized understanding;Need further instruction             OT Long Term Goals - 02/07/15 1659    OT LONG TERM GOAL #1   Title Patient will demonstrate ability to place one of 9 hole peg test peg to improve fine motor to be abel to fasten pants fasteners.   Baseline unable   Time 12   Period Weeks   Status New   OT LONG TERM GOAL #2   Title Will improve right hand and shoulder function sufficiently to be able to shave self.   Baseline daughter has to shave him.   Time 12   Period Weeks   Status New   OT LONG TERM GOAL #3   Title Will improve balance and left upper extremity function to be able to make a simple snack.   Baseline Patient does no food prep.   Time 12   Period Weeks   Status New   OT LONG TERM GOAL #4   Title Will improve right hand function or learn to write with left hand so that his writing is 80% legible.    Baseline Hand writing is not legible   Time 12   Period Weeks   Status New   OT LONG TERM GOAL #5   Title Will improve R shoulder flexion to 90o to be able to move ADL objects on a table    Baseline shoulder flexion limited to 50o of flexion.   Time 12   Period Weeks   Status New               Plan - 02/07/15 1640    Clinical Impression Statement  This pleasant 69 year old male who had a L thalamic cva infarct on 07/06/2014. He shows deficits with left upper and lower strength  weakness, R UE sensation, range of motion, and coordination deficits and needing assist with his ADL such as dressing, bathing, toileting, and food prep. He would benefit from OT for ADL and functional  mobility training.   Pt will benefit from skilled therapeutic intervention in order to improve on the following deficits (Retired) Decreased activity tolerance;Decreased balance;Decreased coordination;Decreased endurance;Decreased knowledge of use of DME;Decreased range of motion;Decreased strength;Difficulty walking;Impaired perceived functional ability;Impaired flexibility;Impaired UE functional use;Impaired tone;Impaired sensation   Rehab Potential Good   OT Frequency 2x / week   OT Duration 12 weeks   OT Treatment/Interventions Self-care/ADL training;Neuromuscular education;Therapeutic exercise;Energy conservation;Manual Therapy;Therapeutic activities;Therapeutic exercises;Passive range of motion   Plan OT 2 x per week for 12 weeks.   Recommended Other Services Starting with finger extension exercises.   Consulted and Agree with Plan of Care Patient;Family member/caregiver   Family Member Consulted Daughter          G-Codes - 02-10-15 1714    Functional Assessment Tool Used clinical judgement   Functional Limitation Self care   Self Care Current Status [id number]) At least 60 percent but less than 80 percent impaired, limited or restricted   Self Care Goal Status (X6147) At least 40 percent but less than 60 percent impaired, limited or restricted      Problem List Patient Active Problem List   Diagnosis Date Noted  . Tobacco use disorder 12/12/2014  . Hemiparesis ([place]) 12/12/2014  . Basal ganglia hemorrhage ([place]) 12/12/2014  . Chronic pain syndrome 12/04/2014  . Alterations of sensations following CVA (cerebrovascular accident) 07/10/2014  . Acute  respiratory failure with hypoxia ([place]) 07/07/2014  . Abnormal urinalysis 07/07/2014  . Hypokalemia 07/07/2014  . Dehydration 07/07/2014  . Metabolic encephalopathy 11/08/5745  . Aortic root dilatation (48 mm May 2016) 07/07/2014  . Ascending aorta dilatation (63m May 2016) 07/07/2014  . ICH (intracerebral hemorrhage) ([place] 07/07/2014  . Nontraumatic thalamic hemorrhage ([place] 07/06/2014  . Hemiplegia affecting right dominant side ([place] 07/06/2014  . Cognitive deficit due to recent stroke 07/06/2014  . IVH (intraventricular hemorrhage) ([place] 07/06/2014  . Essential hypertension 07/06/2014  . Intracranial hemorrhage ([facility]     HSharon Mt112-31-16 5:51 PM  [place] [facility] [address] [place] [address]Phone: [phone]  Fax:  [phone] Name: CDerril FranekMRN: [id number] of Birth: [birth year]

## 2015-02-07 NOTE — Patient Instructions (Signed)
Instructed in home program for PIP extension as they are contracted including placing on table and stretching into extension.

## 2015-02-14 ENCOUNTER — Encounter: Payer: Self-pay | Admitting: Occupational Therapy

## 2015-02-14 ENCOUNTER — Ambulatory Visit: Payer: Medicare Other | Admitting: Physical Therapy

## 2015-02-14 ENCOUNTER — Ambulatory Visit: Payer: Medicare Other | Attending: Physical Medicine & Rehabilitation | Admitting: Occupational Therapy

## 2015-02-14 ENCOUNTER — Encounter: Payer: Self-pay | Admitting: Physical Therapy

## 2015-02-14 DIAGNOSIS — R5383 Other fatigue: Secondary | ICD-10-CM | POA: Insufficient documentation

## 2015-02-14 DIAGNOSIS — I69359 Hemiplegia and hemiparesis following cerebral infarction affecting unspecified side: Secondary | ICD-10-CM | POA: Insufficient documentation

## 2015-02-14 DIAGNOSIS — R262 Difficulty in walking, not elsewhere classified: Secondary | ICD-10-CM | POA: Diagnosis present

## 2015-02-14 NOTE — Patient Instructions (Signed)
Instructed in home weight bearing to help normalize tone.

## 2015-02-14 NOTE — Therapy (Signed)
[place] [facility] MAIN [facility] SERVICES [address] [place], [place], [address] Phone: [phone]   Fax:  [phone]  Occupational Therapy Treatment  Patient Details  Name: Michael Goodwin MRN: [id number] Date of Birth: 07-09-1945 Referring Provider: Loletta Parish  Encounter Date: 02/14/2015      OT End of Session - 02/14/15 1611    Visit Number 2   Number of Visits 24   Date for OT Re-Evaluation 05/02/15   Authorization Type medicare      Past Medical History  Diagnosis Date  . Stroke ([facility])   . Essential hypertension 07/06/2014  . 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 Hemiplegia affecting right dominant side ([facility]) 07/06/2014    Past Surgical History  Procedure Laterality Date  . No past surgeries      There were no vitals filed for this visit.  Visit Diagnosis:  Hemiparesis affecting dominant side as late effect of cerebrovascular accident [facility])      Subjective Assessment - 02/14/15 1607    Subjective  This hand gives me trouble.   Pertinent History This patient is a 70 year old male who came to [facility] out patient after suffering a stroke (L thalamic infarct) on May 26th 2016. It resulted in R UE and LE weakness and facial weakness. He also demonstrates sensory loss  Patient spent one month at [facility] before going home. He recieved home health therapy. Daughter reports patient weakness worse since stoping home healt.He had been living alone, but now daughter lives at his daughter's house.,  He needs assist  with dressing, bathing, shaving, and toileting.      Limitations Walking, ADL, R UE function.   Patient Stated Goals To be able to walk better and do things "for my self."    Patient stretched into reflex inhibiting pattern through out session to aid in normalizing tone. weight bearing through out session to normalize tone. One inch pegs placed in peg board. Took 10 min for 5 pegs. Completed connect 4 for fine motor.  Allowed patient to place checker in right hand with left before placing in slot.  Then removed pegs one at a time. Cues needed for technique and positioning.                          OT Education - 02/14/15 1609    Education provided Yes   Education Details instructed in theory of weight bearing and why it works   Teacher, music) Educated Patient   Methods Explanation   Comprehension Verbal cues required;Tactile cues required;Need further instruction             OT Long Term Goals - 02/07/15 1659    OT LONG TERM GOAL #1   Title Patient will demonstrate ability to place one of 9 hole peg test peg to improve fine motor to be abel to fasten pants fasteners.   Baseline unable   Time 12   Period Weeks   Status New   OT LONG TERM GOAL #2   Title Will improve right hand and shoulder function sufficiently to be able to shave self.   Baseline daughter has to shave him.   Time 12   Period Weeks   Status New   OT LONG TERM GOAL #3   Title Will improve balance and left upper extremity function to be able to make a simple snack.   Baseline Patient does no food prep.   Time 12  Period Weeks   Status New   OT LONG TERM GOAL #4   Title Will improve right hand function or learn to write with left hand so that his writing is 80% legible.    Baseline Hand writing is not legible   Time 12   Period Weeks   Status New   OT LONG TERM GOAL #5   Title Will improve R shoulder flexion to 90o to be able to move ADL objects on a table    Baseline shoulder flexion limited to 50o of flexion.   Time 12   Period Weeks   Status New               Plan - 02/14/15 1612    Clinical Impression Statement This patient is a 70 year old male who had a L thalamic cva infarct on 07/06/2014. His hand is very limited but does respond to reflex inhibiting stretching and weight bearing with decreased tone.    Pt will benefit from skilled therapeutic intervention in order to improve on the  following deficits (Retired) Decreased activity tolerance;Decreased balance;Decreased coordination;Decreased endurance;Decreased knowledge of use of DME;Decreased range of motion;Decreased strength;Difficulty walking;Impaired perceived functional ability;Impaired flexibility;Impaired UE functional use;Impaired tone;Impaired sensation   OT Treatment/Interventions Self-care/ADL training;Neuromuscular education;Therapeutic exercise;Energy conservation;Manual Therapy;Therapeutic activities;Therapeutic exercises;Passive range of motion        Problem List Patient Active Problem List   Diagnosis Date Noted  . Tobacco use disorder 12/12/2014  . Hemiparesis (HCC) 12/12/2014  . 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 Chronic pain syndrome 12/04/2014  . Alterations of sensations following CVA (cerebrovascular accident) 07/10/2014  . Acute respiratory failure with hypoxia (HCC) 07/07/2014  . Abnormal urinalysis 07/07/2014  . Hypokalemia 07/07/2014  . Dehydration 07/07/2014  . Metabolic encephalopathy 07/07/2014  . Aortic root dilatation (48 mm May 2016) 07/07/2014  . 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 ICH (intracerebral hemorrhage) (HCC) 07/07/2014  . 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 Hemiplegia affecting right dominant side (HCC) 07/06/2014  . Cognitive deficit due to recent stroke 07/06/2014  . IVH (intraventricular hemorrhage) (HCC) 07/06/2014  . Essential hypertension 07/06/2014  . Intracranial hemorrhage (HCC)    Ocie CornfieldJohn M Eller Sweis, MS/OTR/L  Ocie CornfieldHuff, Shawntez Dickison M 02/14/2015, 4:15 PM  [place] [facility] MAIN [facility] [address] [place], [place], [address] Phone: [phone]   Fax:  [phone]  Name: Michael Goodwin MRN: [id number] Date of Birth: 03-31-1945

## 2015-02-14 NOTE — Therapy (Signed)
[place] [facility] [address] [place], [place], [address] Phone: [phone]   Fax:  [phone]  Physical Therapy Treatment  Patient Details  Name: Michael ArbourClyde Goodwin MRN: [id number] Date of Birth: 02/17/1945 Referring Provider: Claudette LawsKirsteins, Andrew  Encounter Date: 02/14/2015      PT End of Session - 02/14/15 1537    Visit Number 3   Number of Visits 17   Date for PT Re-Evaluation 03/30/15   Authorization Type 3   Authorization Time Period 10   PT Start Time 1515   PT Stop Time 1600   PT Time Calculation (min) 45 min   Equipment Utilized During Treatment Gait belt   Activity Tolerance Patient tolerated treatment well;Patient limited by fatigue   Behavior During Therapy [facility] for tasks assessed/performed      Past Medical History  Diagnosis Date  . Stroke ([facility])   . Essential hypertension 07/06/2014  . 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 Hemiplegia affecting right dominant side (HCC) 07/06/2014    Past Surgical History  Procedure Laterality Date  . No past surgeries      There were no vitals filed for this visit.  Visit Diagnosis:  Difficulty walking  Lethargy      Subjective Assessment - 02/14/15 1531    Subjective Patient repots mixed compliance with HEP " I just get so tired."; He reports no new falls;    Pertinent History Pt sufferred a L thalamic infarct May 26,2016 with weakness in RUE/RLE as well as facial weakness. Pt also complains of R sided numbness/tingling. [facility] and he was discharged directly home. Pt did not discharge to SNF but once home he has been receiving HH PT. His last PT session was was last week and even since ending therapy his daughter reports he seems like he has gotten weaker. Pt was living in his own home but daughter moved him into her house so that he could receive better care. Pt reports that he has the most difficulty with transfers  and long distance ambulation.   Limitations Walking   How long can you walk comfortably? a few hundred feet   Patient Stated Goals To improve independent transfers, ambulation, and bathing.    Currently in Pain? No/denies         TREATMENT: Gait around gym with hemiwalker x200 feet with CGA and mod VCs to increase base of support, increase LLE step length for better reciprocal gait pattern; patient demonstrates increased left lateral trunk lean requiring min-mod A to improve right weight shift during left swing; Gait with hemiwalker, min A 20 feet x2 with cues for safety and to avoid putting hemiwalker in front of LLE for better reciprocal gait pattern.   Quantum leg press: BLE 75# x12; RLE only 45# 2x12; Patient required mod VCs to slow down eccentric return for better LE strengthening; PT also instructed patient to increase hip abduction to reduce RLE knee valgus during single leg, leg press;  Standing with red tband around both ankles: Hip abduction x20 bilaterally; Side stepping 10 feet x3 laps each direction with mod VCs to increase step length for better hip abductor strengthening;  Sit<>Stand with LLE advanced forward ahead of RLE for increased RLE weight shift (raised mat table x5), low mat table x5 with mod Vcs for increase forward trunk lean and to increase push through BLE;  Prone: Hip extension AAROM x10 bilaterally with mod A to increase hip  extension due to increased weakness and tightness in hip flexors; Hamstring curl, knee flexion x10 bilaterally with min VCs to slow down eccentric return for better motor control.    Patient required min-moderate verbal/tactile cues for correct exercise technique.                            PT Education - 02/14/15 1537    Education provided Yes   Education Details LE strengthening, gait safety;    Person(s) Educated Patient   Methods Explanation;Verbal cues   Comprehension Verbalized understanding;Returned  demonstration;Verbal cues required             PT Long Term Goals - 02/07/15 1750    PT LONG TERM GOAL #1   Title Pt will be independent and compliant with HEP in order to improve strength, transfers, and balance   Time 8   Period Weeks   Status New   PT LONG TERM GOAL #2   Title Pt will demonstrate increase in gait speed by at least 0.14 m/s in order to demonstrate clinically significant improvement in speed to improve function at home.   Baseline 02/01/15: 26.8 = 0.37 m/s   Time 8   Period Weeks   Status New   PT LONG TERM GOAL #3   Title Pt wil decrease TUG by at least 2.9 seconds in order to demonstrate improved LE strength and balance in order to decrease falls.    Baseline 02/01/15: 31.48 seconds   Time 8   Period Weeks   Status New               Plan - 02/14/15 1628    Clinical Impression Statement instructed patient in gait safety utilizing hemiwalker. He continues to require CGA/min A and mod Vcs for correct gait techniques; Patient instructed in advanced strengthening. He demonstrated significant weakness in hip extensors/gluteal muscles having increased difficulty performing hip extension and hamstring curl in prone. Patient would benefit from additional skilled PT intervention to improve LE strength, balance and gait safety;    Pt will benefit from skilled therapeutic intervention in order to improve on the following deficits Abnormal gait;Decreased balance;Decreased cognition;Decreased knowledge of precautions;Decreased mobility;Decreased range of motion;Decreased safety awareness;Decreased strength;Difficulty walking;Hypomobility;Impaired tone;Impaired UE functional use   Rehab Potential Good   Clinical Impairments Affecting Rehab Potential Positive: motivation, family suppport, current progress, Negative: decreased compliance with HEP, RUE/RLE weakness   PT Frequency 2x / week   PT Duration 8 weeks   PT Treatment/Interventions ADLs/Self Care Home  Management;Aquatic Therapy;Biofeedback;Electrical Stimulation;Iontophoresis 4mg /ml Dexamethasone;Traction;Ultrasound;Contrast Bath;DME Instruction;Gait training;Stair training;Functional mobility training;Therapeutic activities;Therapeutic exercise;Balance training;Neuromuscular re-education;Cognitive remediation;Patient/family education;Orthotic Fit/Training;Manual techniques;Energy conservation   PT Next Visit Plan advance HEP   PT Home Exercise Plan continue as previously given   Consulted and Agree with Plan of Care Patient;Family member/caregiver   Family Member Consulted Daughter        Problem List Patient Active Problem List   Diagnosis Date Noted  . Tobacco use disorder 12/12/2014  . Hemiparesis (HCC) 12/12/2014  . 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 Chronic pain syndrome 12/04/2014  . Alterations of sensations following CVA (cerebrovascular accident) 07/10/2014  . Acute respiratory failure with hypoxia (HCC) 07/07/2014  . Abnormal urinalysis 07/07/2014  . Hypokalemia 07/07/2014  . Dehydration 07/07/2014  . Metabolic encephalopathy 07/07/2014  . Aortic root dilatation (48 mm May 2016) 07/07/2014  . 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 ICH (intracerebral hemorrhage) (HCC) 07/07/2014  .  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 Hemiplegia affecting right dominant side (HCC) 07/06/2014  . Cognitive deficit due to recent stroke 07/06/2014  . IVH (intraventricular hemorrhage) (HCC) 07/06/2014  . Essential hypertension 07/06/2014  . Intracranial hemorrhage (HCC)     Leoni Goodness PT, DPT 02/14/2015, 4:31 PM  [place] [facility] MAIN [facility] SERVICES [address] [place], [place], [address] Phone: [phone]   Fax:  [phone]  Name: Michael Goodwin MRN: [id number] Date of Birth: May 03, 1945

## 2015-02-19 ENCOUNTER — Ambulatory Visit: Payer: Medicare Other | Admitting: Physical Medicine & Rehabilitation

## 2015-02-20 ENCOUNTER — Ambulatory Visit: Payer: Medicare Other | Admitting: Physical Therapy

## 2015-02-20 ENCOUNTER — Ambulatory Visit: Payer: Medicare Other | Admitting: Occupational Therapy

## 2015-02-22 ENCOUNTER — Ambulatory Visit: Payer: Medicare Other | Admitting: Occupational Therapy

## 2015-02-22 ENCOUNTER — Ambulatory Visit: Payer: Medicare Other | Admitting: Physical Therapy

## 2015-02-26 ENCOUNTER — Ambulatory Visit (HOSPITAL_BASED_OUTPATIENT_CLINIC_OR_DEPARTMENT_OTHER): Payer: Medicare Other | Admitting: Physical Medicine & Rehabilitation

## 2015-02-26 ENCOUNTER — Encounter: Payer: Self-pay | Admitting: Physical Medicine & Rehabilitation

## 2015-02-26 ENCOUNTER — Encounter: Payer: Medicare Other | Attending: Physical Medicine & Rehabilitation

## 2015-02-26 VITALS — BP 111/73 | HR 72 | Resp 14

## 2015-02-26 DIAGNOSIS — R208 Other disturbances of skin sensation: Secondary | ICD-10-CM

## 2015-02-26 DIAGNOSIS — I6931 Attention and concentration deficit following cerebral infarction: Secondary | ICD-10-CM | POA: Diagnosis not present

## 2015-02-26 DIAGNOSIS — F17210 Nicotine dependence, cigarettes, uncomplicated: Secondary | ICD-10-CM | POA: Insufficient documentation

## 2015-02-26 DIAGNOSIS — M7551 Bursitis of right shoulder: Secondary | ICD-10-CM | POA: Diagnosis not present

## 2015-02-26 DIAGNOSIS — I610 Nontraumatic intracerebral hemorrhage in hemisphere, subcortical: Secondary | ICD-10-CM

## 2015-02-26 DIAGNOSIS — I69351 Hemiplegia and hemiparesis following cerebral infarction affecting right dominant side: Secondary | ICD-10-CM | POA: Diagnosis present

## 2015-02-26 DIAGNOSIS — I619 Nontraumatic intracerebral hemorrhage, unspecified: Secondary | ICD-10-CM | POA: Diagnosis not present

## 2015-02-26 DIAGNOSIS — I10 Essential (primary) hypertension: Secondary | ICD-10-CM | POA: Diagnosis not present

## 2015-02-26 DIAGNOSIS — I69898 Other sequelae of other cerebrovascular disease: Secondary | ICD-10-CM

## 2015-02-26 DIAGNOSIS — Z833 Family history of diabetes mellitus: Secondary | ICD-10-CM | POA: Diagnosis not present

## 2015-02-26 DIAGNOSIS — R209 Unspecified disturbances of skin sensation: Principal | ICD-10-CM

## 2015-02-26 DIAGNOSIS — I69398 Other sequelae of cerebral infarction: Secondary | ICD-10-CM | POA: Diagnosis not present

## 2015-02-26 DIAGNOSIS — I69319 Unspecified symptoms and signs involving cognitive functions following cerebral infarction: Secondary | ICD-10-CM | POA: Diagnosis not present

## 2015-02-26 NOTE — Patient Instructions (Signed)
Rotator Cuff Injury °Rotator cuff injury is any type of injury to the set of muscles and tendons that make up the stabilizing unit of your shoulder. This unit holds the ball of your upper arm bone (humerus) in the socket of your shoulder blade (scapula).  °CAUSES °Injuries to your rotator cuff most commonly come from sports or activities that cause your arm to be moved repeatedly over your head. Examples of this include throwing, weight lifting, swimming, or racquet sports. Long lasting (chronic) irritation of your rotator cuff can cause soreness and swelling (inflammation), bursitis, and eventual damage to your tendons, such as a tear (rupture). °SIGNS AND SYMPTOMS °Acute rotator cuff tear: °· Sudden tearing sensation followed by severe pain shooting from your upper shoulder down your arm toward your elbow. °· Decreased range of motion of your shoulder because of pain and muscle spasm. °· Severe pain. °· Inability to raise your arm out to the side because of pain and loss of muscle power (large tears). °Chronic rotator cuff tear: °· Pain that usually is worse at night and may interfere with sleep. °· Gradual weakness and decreased shoulder motion as the pain worsens. °· Decreased range of motion. °Rotator cuff tendinitis:  °· Deep ache in your shoulder and the outside upper arm over your shoulder. °· Pain that comes on gradually and becomes worse when lifting your arm to the side or turning it inward. °DIAGNOSIS °Rotator cuff injury is diagnosed through a medical history, physical exam, and imaging exam. The medical history helps determine the type of rotator cuff injury. Your health care provider will look at your injured shoulder, feel the injured area, and ask you to move your shoulder in different positions. X-ray exams typically are done to rule out other causes of shoulder pain, such as fractures. MRI is the exam of choice for the most severe shoulder injuries because the images show muscles and tendons.    °TREATMENT  °Chronic tear: °· Medicine for pain, such as acetaminophen or ibuprofen. °· Physical therapy and range-of-motion exercises may be helpful in maintaining shoulder function and strength. °· Steroid injections into your shoulder joint. °· Surgical repair of the rotator cuff if the injury does not heal with noninvasive treatment. °Acute tear: °· Anti-inflammatory medicines such as ibuprofen and naproxen to help reduce pain and swelling. °· A sling to help support your arm and rest your rotator cuff muscles. Long-term use of a sling is not advised. It may cause significant stiffening of the shoulder joint. °· Surgery may be considered within a few weeks, especially in younger, active people, to return the shoulder to full function. °· Indications for surgical treatment include the following: °¨ Age younger than 60 years. °¨ Rotator cuff tears that are complete. °¨ Physical therapy, rest, and anti-inflammatory medicines have been used for 6-8 weeks, with no improvement. °¨ Employment or sporting activity that requires constant shoulder use. °Tendinitis: °· Anti-inflammatory medicines such as ibuprofen and naproxen to help reduce pain and swelling. °· A sling to help support your arm and rest your rotator cuff muscles. Long-term use of a sling is not advised. It may cause significant stiffening of the shoulder joint. °· Severe tendinitis may require: °¨ Steroid injections into your shoulder joint. °¨ Physical therapy. °¨ Surgery. °HOME CARE INSTRUCTIONS  °· Apply ice to your injury: °¨ Put ice in a plastic bag. °¨ Place a towel between your skin and the bag. °¨ Leave the ice on for 20 minutes, 2-3 times a day. °· If you   have a shoulder immobilizer (sling and straps), wear it until told otherwise by your health care provider. °· You may want to sleep on several pillows or in a recliner at night to lessen swelling and pain. °· Only take over-the-counter or prescription medicines for pain, discomfort, or fever as  directed by your health care provider. °· Do simple hand squeezing exercises with a soft rubber ball to decrease hand swelling. °SEEK MEDICAL CARE IF:  °· Your shoulder pain increases, or new pain or numbness develops in your arm, hand, or fingers. °· Your hand or fingers are colder than your other hand. °SEEK IMMEDIATE MEDICAL CARE IF:  °· Your arm, hand, or fingers are numb or tingling. °· Your arm, hand, or fingers are increasingly swollen and painful, or they turn white or blue. °MAKE SURE YOU: °· Understand these instructions. °· Will watch your condition. °· Will get help right away if you are not doing well or get worse. °  °This information is not intended to replace advice given to you by your health care provider. Make sure you discuss any questions you have with your health care provider. °  °Document Released: 01/25/2000 Document Revised: 02/01/2013 Document Reviewed: 09/08/2012 °Elsevier Interactive Patient Education ©2016 [employer] ° °

## 2015-02-26 NOTE — Progress Notes (Signed)
Subjective:    Patient ID: Michael Goodwin, male    DOB: Aug 05, 1945, 70 y.o.   MRN: [id number]  HPI 70 year old male with history of left thalamic/Basal ganglia ICH causing right hemiparesis and right hemisensory deficits as well as cognitive deficits. He underwent inpatient rehabilitation at [facility] in June of this year. Was discharged and received Home health therapy. He completed home health therapy and now has been attending outpatient PT and OT at [facility] since December 2016.  Patient's daughter notes that he's had increasing right shoulder pain. He's had no falls or trauma to that area. He is receiving therapy. No recent surgeries to that area.  Pain Inventory Average Pain 7 Pain Right Now 7 My pain is aching  In the last 24 hours, has pain interfered with the following? General activity 6 Relation with others no selection Enjoyment of life no selection What TIME of day is your pain at its worst? varies Sleep (in general) Good  Pain is worse with: bending Pain improves with: therapy/exercise Relief from Meds: 5  Mobility walk with assistance use a cane how many minutes can you walk? 6 ability to climb steps?  yes do you drive?  no use a wheelchair needs help with transfers Do you have any goals in this area?  yes  Function disabled: date disabled .  Neuro/Psych numbness confusion loss of taste or smell  Prior Studies Any changes since last visit?  no  Physicians involved in your care Any changes since last visit?  no   Family History  Problem Relation Age of Onset  . Diabetes    . Stroke Brother    Social History   Social History  . Marital Status: Widowed    Spouse Name: N/A  . Number of Children: 3  . Years of Education: 7   Social History Main Topics  . Smoking status: Current Every Day Smoker -- 0.10 packs/day for 55 years    Types: Cigarettes  . Smokeless tobacco: None  . Alcohol Use: No  . Drug Use: No  .  Sexual Activity: Not Asked   Other Topics Concern  . None   Social History Narrative   Lives at home with daughter and cousin, brother.   Caffeine use: Coffee occassionally    Past Surgical History  Procedure Laterality Date  . No past surgeries     Past Medical History  Diagnosis Date  . Stroke (HCC)   . Essential hypertension 07/06/2014  . 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 Hemiplegia affecting right dominant side (HCC) 07/06/2014   BP 111/73 mmHg  Pulse 72  Resp 14  SpO2 96%  Opioid Risk Score:   Fall Risk Score:  `1  Depression screen PHQ 2/9  [facility] 02/26/2015 10/02/2014  Decreased Interest 1 3  Down, Depressed, Hopeless 1 3  PHQ - 2 Score 2 6  Altered sleeping 0 3  Tired, decreased energy 0 3  Change in appetite 3 3  Feeling bad or failure about yourself  0 3  Trouble concentrating 0 3  Moving slowly or fidgety/restless 0 3  Suicidal thoughts 0 0  PHQ-9 Score 5 24     Review of Systems  Constitutional: Positive for unexpected weight change.  Cardiovascular: Positive for leg swelling.  All other systems reviewed and are negative.      Objective:   Physical Exam Positive impingement sign right shoulder at 90. Mild pain with external rotation but fairly good range  There is some  Muscle wasting and mild subluxation of the right shoulder. Motor strength is 3 minus in the right deltoid, biceps, triceps, grip, finger flexors and extensors 4 minus in the knee extensor 3 at the hip flexor 3 at the ankle dorsiflexor Sensation reduced to light touch in the right upper extremity. Mood and affect without lability or agitation Cognitively he has delayed responses      Assessment & Plan:  1.Left thalamic infarct with right hemiparesis onset in May 2016, Completed inpatient as well as home health therapies. Now attending outpatient PT OT for gait training and right upper extremity neuromuscular reeducation. Advised continue twice  a week therapies  2. Right subacromial bursitis, not a good candidate for nonsteroidal anti-inflammatory secondary to history of recent stroke. Recommend corticosteroid injection subacromial area today. May need glenohumeral injection if this is not too quickly helpful. Reassess in 1 month. At this point I do not think any x-rays are needed Shoulder injection Right   Indication:Right Shoulder pain not relieved by medication management and other conservative care.  Informed consent was obtained after describing risks and benefits of the procedure with the patient, this includes bleeding, bruising, infection and medication side effects. The patient wishes to proceed and has given written consent. Patient was placed in a seated position. The Right shoulder was marked and prepped with betadine in the subacromial area. A 25-gauge 1-1/2 inch needle was inserted into the subacromial area. After negative draw back for blood, a solution containing 1 mL of 6 mg per ML betamethasone and 4 mL of 1% lidocaine was injected. A band aid was applied. The patient tolerated the procedure well. Post procedure instructions were given.

## 2015-02-27 ENCOUNTER — Ambulatory Visit: Payer: Medicare Other | Admitting: Occupational Therapy

## 2015-02-27 ENCOUNTER — Encounter: Payer: Self-pay | Admitting: Occupational Therapy

## 2015-02-27 ENCOUNTER — Ambulatory Visit: Payer: Medicare Other | Admitting: Physical Therapy

## 2015-02-27 ENCOUNTER — Encounter: Payer: Self-pay | Admitting: Physical Therapy

## 2015-02-27 DIAGNOSIS — I69359 Hemiplegia and hemiparesis following cerebral infarction affecting unspecified side: Secondary | ICD-10-CM

## 2015-02-27 DIAGNOSIS — R262 Difficulty in walking, not elsewhere classified: Secondary | ICD-10-CM

## 2015-02-27 DIAGNOSIS — R5383 Other fatigue: Secondary | ICD-10-CM

## 2015-02-27 NOTE — Patient Instructions (Signed)
Re-instructed patient to cut his nails as it will help with fine motor.

## 2015-02-27 NOTE — Therapy (Signed)
[place] [facility] MAIN [facility] SERVICES [address] [place], [place], [address] Phone: [phone]   Fax:  [phone]  Physical Therapy Treatment  Patient Details  Name: Michael Goodwin MRN: [id number] Date of Birth: 1945/09/09 Referring Provider: Claudette Laws  Encounter Date: 02/27/2015      PT End of Session - 02/27/15 1644    Visit Number 4   Number of Visits 17   Date for PT Re-Evaluation 03/30/15   Authorization Type 4   Authorization Time Period 10   PT Start Time 1600   PT Stop Time 1645   PT Time Calculation (min) 45 min   Equipment Utilized During Treatment Gait belt   Activity Tolerance Patient tolerated treatment well;Patient limited by fatigue   Behavior During Therapy [facility] for tasks assessed/performed      Past Medical History  Diagnosis Date  . Stroke (HCC)   . Essential hypertension 07/06/2014  . 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 Hemiplegia affecting right dominant side (HCC) 07/06/2014    Past Surgical History  Procedure Laterality Date  . No past surgeries      There were no vitals filed for this visit.  Visit Diagnosis:  Lethargy  Difficulty walking      Subjective Assessment - 02/27/15 1552    Subjective Patient reports increased RUE shoulder pain and is s/p cortisone injection from MD yesterday on 02/26/15; He denies any pain currently; denies any new changes; He reports walking some at home but not doing all the exercises. He denies any new falls;    Pertinent History Pt sufferred a L thalamic infarct May 26,2016 with weakness in RUE/RLE as well as facial weakness. Pt also complains of R sided numbness/tingling. Pt spent a total of 30 days at [facility] and he was discharged directly home. Pt did not discharge to SNF but once home he has been receiving HH PT. His last PT session was was last week and even since ending therapy his daughter reports he seems like he has gotten weaker. Pt was  living in his own home but daughter moved him into her house so that he could receive better care. Pt reports that he has the most difficulty with transfers and long distance ambulation.   Limitations Walking   How long can you walk comfortably? a few hundred feet   Patient Stated Goals To improve independent transfers, ambulation, and bathing.    Currently in Pain? No/denies        Instructed patient in gait training on treadmill: 0.5-1.0 mph with 2 HHA 3 sets of 3 min, with mod VCs to increase LLE step length, increase DF at heel strike, increase hip flexion on RLE for increased foot clearance; Patient required mod VCs and min A to assist in lifting RLE and placing on treadmill for better reciprocal gait pattern; He demonstrates decreased foot clearance, decreased step length and increased hip adduction with increased tone with increased fatigue; Patient was able to demonstrate better reciprocal gait pattern with improved equal cadence on treadmill with increased instruction;  Patient required mod VCS and min A with transfers between sets;   Gait with hemiwalker 100 feet x2 with min A and mod VCs to increase LLE step length; Patient demonstrates a 3 point gait pattern with hemi-walker; Gait with rollator 80 feet x1 with min A for balance; Patient demonstrates increased unsteadiness with difficulty catching self when RLE drags; He requires mod VCs to increase RLE hip flexion for better foot clearance;  Sit<>Stand on edge of chair without HHA x10; CGA for balance with min VCs to increase forward trunk lean;  Gait pushing wheelchair 75 feet x1 with min A and similar cues to stay close to wheelchair and to increase LLE step length as well as RLE hip flexion for better foot clearance with swing;                           PT Education - 02/27/15 1644    Education provided Yes   Education Details gait safety, transfers   Person(s) Educated Patient   Methods  Explanation;Verbal cues   Comprehension Verbalized understanding;Returned demonstration;Verbal cues required             PT Long Term Goals - 02/07/15 1750    PT LONG TERM GOAL #1   Title Pt will be independent and compliant with HEP in order to improve strength, transfers, and balance   Time 8   Period Weeks   Status New   PT LONG TERM GOAL #2   Title Pt will demonstrate increase in gait speed by at least 0.14 m/s in order to demonstrate clinically significant improvement in speed to improve function at home.   Baseline 02/01/15: 26.8 = 0.37 m/s   Time 8   Period Weeks   Status New   PT LONG TERM GOAL #3   Title Pt wil decrease TUG by at least 2.9 seconds in order to demonstrate improved LE strength and balance in order to decrease falls.    Baseline 02/01/15: 31.48 seconds   Time 8   Period Weeks   Status New               Plan - 02/27/15 1644    Clinical Impression Statement Instructed patient in advanced gait training with treadmill, hemi walker and other AD; Patient requires frequent cues to increase LLE step length, increase RLE hip flexion during swing for better foot clearance; He was able to demonstrate better equal cadence while walking on treadmill. He does have difficulty increasing hip flexion with swing on RLE due to increased extensor tone; Patient also demonstrates increased RLE knee valgus with hip adduction with increased tone due to fatigue.He would benefit from additional skilled PT intervention to improve gait safety and balance;    Pt will benefit from skilled therapeutic intervention in order to improve on the following deficits Abnormal gait;Decreased balance;Decreased cognition;Decreased knowledge of precautions;Decreased mobility;Decreased range of motion;Decreased safety awareness;Decreased strength;Difficulty walking;Hypomobility;Impaired tone;Impaired UE functional use   Rehab Potential Good   Clinical Impairments Affecting Rehab Potential  Positive: motivation, family suppport, current progress, Negative: decreased compliance with HEP, RUE/RLE weakness   PT Frequency 2x / week   PT Duration 8 weeks   PT Treatment/Interventions ADLs/Self Care Home Management;Aquatic Therapy;Biofeedback;Electrical Stimulation;Iontophoresis /ml Dexamethasone;Traction;Ultrasound;Contrast Bath;DME Instruction;Gait training;Stair training;Functional mobility training;Therapeutic activities;Therapeutic exercise;Balance training;Neuromuscular re-education;Cognitive remediation;Patient/family education;Orthotic Fit/Training;Manual techniques;Energy conservation   PT Next Visit Plan assess goals;    PT Home Exercise Plan continue as previously given   Consulted and Agree with Plan of Care Patient;Family member/caregiver   Family Member Consulted Daughter        Problem List Patient Active Problem List   Diagnosis Date Noted  . Tobacco use disorder 12/12/2014  . Hemiparesis (HCC) 12/12/2014  . 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 Chronic pain syndrome 12/04/2014  . Alterations of sensations following CVA (cerebrovascular accident) 07/10/2014  . Acute respiratory failure with hypoxia (HCC) 07/07/2014  . Abnormal  urinalysis 07/07/2014  . Hypokalemia 07/07/2014  . Dehydration 07/07/2014  . Metabolic encephalopathy 07/07/2014  . Aortic root dilatation (48 mm May 2016) 07/07/2014  . 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 ICH (intracerebral hemorrhage) (HCC) 07/07/2014  . 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 Hemiplegia affecting right dominant side (HCC) 07/06/2014  . Cognitive deficit due to recent stroke 07/06/2014  . IVH (intraventricular hemorrhage) (HCC) 07/06/2014  . Essential hypertension 07/06/2014  . Intracranial hemorrhage (HCC)     Trotter,Margaret PT, DPT 02/27/2015, 5:32 PM  [place] [facility] MAIN [facility] SERVICES [address] [place], [place],  [address] Phone: [phone]   Fax:  [phone]  Name: Michael Goodwin MRN: [id number] Date of Birth: 01-29-1946

## 2015-02-27 NOTE — Therapy (Signed)
[facility] MAIN [facility] SERVICES [address] [place], [place], [address] Phone: [phone]   Fax:  [phone]  Occupational Therapy Treatment  Patient Details  Name: Michael Goodwin MRN: [id number] Date of Birth: [birth year] Referring Provider: Loletta Parish  Encounter Date: 02/27/2015      OT End of Session - 02/27/15 1553    OT Start Time 1520   OT Stop Time 1600   OT Time Calculation (min) 40 min      Past Medical History  Diagnosis Date  . Stroke (HCC)   . Essential hypertension 07/06/2014  . 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 Hemiplegia affecting right dominant side (HCC) 07/06/2014    Past Surgical History  Procedure Laterality Date  . No past surgeries      There were no vitals filed for this visit.  Visit Diagnosis:  Hemiparesis affecting dominant side as late effect of cerebrovascular accident [facility])      Subjective Assessment - 02/27/15 1530    Subjective  I forgot to cut my nails   Pertinent History This patient is a 70 year old male who came to [facility] out patient after suffering a stroke (L thalamic infarct) on May 26th 2016. It resulted in R UE and LE weakness and facial weakness. He also demonstrates sensory loss  Patient spent one month at [facility] before going home. He recieved home health therapy. Daughter reports patient weakness worse since stoping home healt.He had been living alone, but now daughter lives at his daughter's house.,  He needs assist  with dressing, bathing, shaving, and toileting.      Limitations Walking, ADL, R UE function.    Attempted Judy/instructo board to flip pegs, but patient unable to flip them. Switched to 3/4 inch pegs, placing in peg board with left and removing with right.  This was successful. Patient stretched into reflex inhibiting pattern through out session to aid in normalizing tone. Scapula mobilization through out session to normalize tone. Attempted writing with  built up handle but unable. Switched to left hand and actually works better with left nondominant hand for now.Cues through out for technique.                                OT Long Term Goals - 02/07/15 1659    OT LONG TERM GOAL #1   Title Patient will demonstrate ability to place one of 9 hole peg test peg to improve fine motor to be abel to fasten pants fasteners.   Baseline unable   Time 12   Period Weeks   Status New   OT LONG TERM GOAL #2   Title Will improve right hand and shoulder function sufficiently to be able to shave self.   Baseline daughter has to shave him.   Time 12   Period Weeks   Status New   OT LONG TERM GOAL #3   Title Will improve balance and left upper extremity function to be able to make a simple snack.   Baseline Patient does no food prep.   Time 12   Period Weeks   Status New   OT LONG TERM GOAL #4   Title Will improve right hand function or learn to write with left hand so that his writing is 80% legible.    Baseline Hand writing is not legible   Time 12   Period Weeks   Status New  OT LONG TERM GOAL #5   Title Will improve R shoulder flexion to 90o to be able to move ADL objects on a table    Baseline shoulder flexion limited to 50o of flexion.   Time 12   Period Weeks   Status New               Plan - 02/27/15 1534    Clinical Impression Statement This patient is a 70 year old male with L thalamic cva. Patient does struggle using his hand but is improving. Reflex inhibiting stretch helps free his hand some from tone.   Pt will benefit from skilled therapeutic intervention in order to improve on the following deficits (Retired) Decreased activity tolerance;Decreased balance;Decreased coordination;Decreased endurance;Decreased knowledge of use of DME;Decreased range of motion;Decreased strength;Difficulty walking;Impaired perceived functional ability;Impaired flexibility;Impaired UE functional use;Impaired  tone;Impaired sensation   OT Treatment/Interventions Self-care/ADL training;Neuromuscular education;Therapeutic exercise;Energy conservation;Manual Therapy;Therapeutic activities;Therapeutic exercises;Passive range of motion        Problem List Patient Active Problem List   Diagnosis Date Noted  . Tobacco use disorder 12/12/2014  . Hemiparesis (HCC) 12/12/2014  . 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 Chronic pain syndrome 12/04/2014  . Alterations of sensations following CVA (cerebrovascular accident) 07/10/2014  . Acute respiratory failure with hypoxia (HCC) 07/07/2014  . Abnormal urinalysis 07/07/2014  . Hypokalemia 07/07/2014  . Dehydration 07/07/2014  . Metabolic encephalopathy 07/07/2014  . Aortic root dilatation (48 mm May 2016) 07/07/2014  . 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 ICH (intracerebral hemorrhage) (HCC) 07/07/2014  . 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 Hemiplegia affecting right dominant side (HCC) 07/06/2014  . Cognitive deficit due to recent stroke 07/06/2014  . IVH (intraventricular hemorrhage) (HCC) 07/06/2014  . Essential hypertension 07/06/2014  . Intracranial hemorrhage (HCC)    Michael Cornfield, MS/OTR/L  Michael Goodwin 02/27/2015, 3:54 PM  [place] [facility] MAIN [facility] SERVICES [address] [place], [place], [address] Phone: [phone]   Fax:  [phone]  Name: Michael Goodwin MRN: [id number] Date of Birth: February 06, 1946

## 2015-03-01 ENCOUNTER — Ambulatory Visit: Payer: Medicare Other | Admitting: Occupational Therapy

## 2015-03-01 ENCOUNTER — Encounter: Payer: Self-pay | Admitting: Occupational Therapy

## 2015-03-01 ENCOUNTER — Encounter: Payer: Self-pay | Admitting: Physical Therapy

## 2015-03-01 ENCOUNTER — Ambulatory Visit: Payer: Medicare Other | Admitting: Physical Therapy

## 2015-03-01 DIAGNOSIS — I69359 Hemiplegia and hemiparesis following cerebral infarction affecting unspecified side: Secondary | ICD-10-CM

## 2015-03-01 DIAGNOSIS — R262 Difficulty in walking, not elsewhere classified: Secondary | ICD-10-CM

## 2015-03-01 DIAGNOSIS — R5383 Other fatigue: Secondary | ICD-10-CM

## 2015-03-01 NOTE — Patient Instructions (Signed)
Instructed patient to get at least thumb, index and middle finger trimmed for fine motor.

## 2015-03-01 NOTE — Therapy (Addendum)
[place] MAIN [facility] SERVICES [address] [place], [place], [address] Phone: [phone]   Fax:  [phone]  Physical Therapy Treatment Progress Note 02/01/15 to 03/01/15  Patient Details  Name: Michael Goodwin MRN: [id number] Date of Birth: 1945-05-30 Referring Provider: Alysia Penna  Encounter Date: 03/01/2015      PT End of Session - 03/01/15 1637    Visit Number 5   Number of Visits 17   Date for PT Re-Evaluation 03/30/15   Authorization Type 1   Authorization Time Period 10   PT Start Time 1630   PT Stop Time 1715   PT Time Calculation (min) 45 min   Equipment Utilized During Treatment Gait belt   Activity Tolerance Patient tolerated treatment well;Patient limited by fatigue   Behavior During Therapy [facility] for tasks assessed/performed      Past Medical History  Diagnosis Date  . Stroke ([place])   . Essential hypertension 07/06/2014  . Nontraumatic thalamic hemorrhage ([place]) 07/06/2014  . Hemiplegia affecting right dominant side ([place]) 07/06/2014    Past Surgical History  Procedure Laterality Date  . No past surgeries      There were no vitals filed for this visit.  Visit Diagnosis:  Difficulty walking  Lethargy      Subjective Assessment - 03/01/15 1628    Subjective Patient reports doing well today; He reports increased fatigue after last treatment session due to prolonged walking; He reports compliance with HEP at home including the ones with theraband.    Pertinent History Pt sufferred a L thalamic infarct May 26,2016 with weakness in RUE/RLE as well as facial weakness. Pt also complains of R sided numbness/tingling. Pt spent a total of 30 days at [facility] and he was discharged directly home. Pt did not discharge to SNF but once home he has been receiving [facility]. His last PT session was was last week and even since ending therapy his daughter reports he seems like he has gotten weaker. Pt was living in his own  home but daughter moved him into her house so that he could receive better care. Pt reports that he has the most difficulty with transfers and long distance ambulation.   Limitations Walking   How long can you walk comfortably? a few hundred feet   Patient Stated Goals To improve independent transfers, ambulation, and bathing.    Currently in Pain? No/denies            [facility] PT Assessment - 03/01/15 0001    Standardized Balance Assessment   10 Meter Walk 0.47 m/s with hemiwalker; increased risk for falls; limited home ambulator; slightly improved from initial eval on 02/01/15 which was 0.37 m/s   Berg Balance Test   Sit to Stand Able to stand  independently using hands   Standing Unsupported Able to stand 2 minutes with supervision   Sitting with Back Unsupported but Feet Supported on Floor or Stool Able to sit safely and securely 2 minutes   Stand to Sit Controls descent by using hands   Transfers Able to transfer with verbal cueing and /or supervision   Standing Unsupported with Eyes Closed Able to stand 10 seconds with supervision   Standing Ubsupported with Feet Together Needs help to attain position but able to stand for 30 seconds with feet together   From Standing, Reach Forward with Outstretched Arm Can reach forward >5 cm safely (2")   From Standing Position, Pick up Object from Floor Unable to pick  up shoe, but reaches 2-5 cm (1-2") from shoe and balances independently   From Standing Position, Turn to Look Behind Over each Shoulder Looks behind one side only/other side shows less weight shift   Turn 360 Degrees Needs assistance while turning   Standing Unsupported, Alternately Place Feet on Step/Stool Needs assistance to keep from falling or unable to try   Standing Unsupported, One Foot in [employer] balance while stepping or standing   Standing on One Leg Unable to try or needs assist to prevent fall   Total Score 26   Berg comment: <36/56 indicates 100% risk for falls    Timed Up and Go Test   Normal TUG (seconds) 23   TUG Comments with hemiwalker, CGA; >14 sec indicates increased risk for falls; improved from initial eval on 02/01/15 which was 31.48 sec;         TREATMENT: Warm up on Nustep level 2 BUE/BLE x4 min (unbilled);  PT instructed patient in 10 meter walk, timed up and go and [employer] assessment. See above for results. He requires CGA for gait safety as he occasionally will drag his right foot and require mod A to avoid falling;  PT educated patient and caregiver with findings. Planning on initiating balance exercise next session;                     PT Education - 03/01/15 1637    Education provided Yes   Education Details progress towards goals; plan of care   Person(s) Educated Patient   Methods Explanation;Verbal cues   Comprehension Verbalized understanding;Returned demonstration;Verbal cues required             PT Long Term Goals - 03/01/15 1649    PT LONG TERM GOAL #1   Title Pt will be independent and compliant with HEP in order to improve strength, transfers, and balance   Time 8   Period Weeks   Status On-going   PT LONG TERM GOAL #2   Title Pt will demonstrate increase in gait speed by at least 0.14 m/s in order to demonstrate clinically significant improvement in speed to improve function at home.   Baseline 02/01/15: 26.8 = 0.37 m/s   Time 8   Period Weeks   Status Partially Met   PT LONG TERM GOAL #3   Title Pt wil decrease TUG to <14 sec  in order to demonstrate improved LE strength and balance in order to decrease falls.    Baseline 02/01/15: 31.48 seconds   Time 8   Period Weeks   Status Revised   PT LONG TERM GOAL #4   Title patient will increase Berg Balance Assessment score by >6  points in order to demonstrate improved balance and safety with ADLs by 03/29/14   Time 4   Period Weeks   Status New   PT LONG TERM GOAL #5   Title Patient will increase 6 min walk distance by >150 feet  to demonstrate improved functional gait ability with home/community ambulation. by 03/29/14   Time 4   Period Weeks   Status New               Plan - 03/01/15 1715    Clinical Impression Statement PT instructed patient in outcome measures to assess progress towards goals. He demonstrates some improvement in gait tasks but is inconsistent. Patient ocassionally will have right foot drag during gait tasks. Patient instructed in [place] assessment to see fall risk. He would  benefit from additional skilled PT intervention to improve balance/gait safety;    Pt will benefit from skilled therapeutic intervention in order to improve on the following deficits Abnormal gait;Decreased balance;Decreased cognition;Decreased knowledge of precautions;Decreased mobility;Decreased range of motion;Decreased safety awareness;Decreased strength;Difficulty walking;Hypomobility;Impaired tone;Impaired UE functional use   Rehab Potential Good   Clinical Impairments Affecting Rehab Potential Positive: motivation, family suppport, current progress, Negative: decreased compliance with HEP, RUE/RLE weakness   PT Frequency 2x / week   PT Duration 8 weeks   PT Treatment/Interventions ADLs/Self Care Home Management;Aquatic Therapy;Biofeedback;Electrical Stimulation;Iontophoresis 30m/ml Dexamethasone;Traction;Ultrasound;Contrast Bath;DME Instruction;Gait training;Stair training;Functional mobility training;Therapeutic activities;Therapeutic exercise;Balance training;Neuromuscular re-education;Cognitive remediation;Patient/family education;Orthotic Fit/Training;Manual techniques;Energy conservation   PT Next Visit Plan initiate balance HEP   PT Home Exercise Plan continue as previously given   Consulted and Agree with Plan of Care Patient;Family member/caregiver   Family Member Consulted Daughter        Problem List Patient Active Problem List   Diagnosis Date Noted  . Tobacco use disorder 12/12/2014  .  Hemiparesis ([place] 12/12/2014  . Basal ganglia hemorrhage ([place] 12/12/2014  . Chronic pain syndrome 12/04/2014  . Alterations of sensations following CVA (cerebrovascular accident) 07/10/2014  . Acute respiratory failure with hypoxia ([place] 07/07/2014  . Abnormal urinalysis 07/07/2014  . Hypokalemia 07/07/2014  . Dehydration 07/07/2014  . Metabolic encephalopathy 099/35/7017 . Aortic root dilatation (48 mm May 2016) 07/07/2014  . Ascending aorta dilatation (53mMay 2016) 07/07/2014  . ICH (intracerebral hemorrhage) (HCMinerv[place] Park05/27/2016  . Nontraumatic thalamic hemorrhage (HCKandiy[place]05/26/2016  . Hemiplegia affecting right dominant side (HCPanorama Village05/26/2016  . Cognitive deficit due to recent stroke 07/06/2014  . IVH (intraventricular hemorrhage) (HCMaytown05/26/2016  . Essential hypertension 07/06/2014  . Intracranial hemorrhage (HCPinckney[place]     Trotter,Margaret PT, DPT 03/01/2015, 5:18 PM  [facility] MedicalERV[facility] [address]hone: [phone] Fax:  [phone]: ClAlberto PinaRN: [id number] of Birth: 6/October 03, 1945

## 2015-03-01 NOTE — Therapy (Signed)
[place] [facility] MAIN [facility] SERVICES [address] [place], [place], [address] Phone: [phone]   Fax:  [phone]  Occupational Therapy Treatment  Patient Details  Name: Michael Goodwin MRN: [id number] Date of Birth: 31-Mar-1945 Referring Provider: Loletta Parish  Encounter Date: 03/01/2015      OT End of Session - 03/01/15 1616    Visit Number 4   Number of Visits 24   Date for OT Re-Evaluation 05/02/15   Authorization Type medicare   Behavior During Therapy [facility] for tasks assessed/performed      Past Medical History  Diagnosis Date  . Stroke (HCC)   . Essential hypertension 07/06/2014  . 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 Hemiplegia affecting right dominant side (HCC) 07/06/2014    Past Surgical History  Procedure Laterality Date  . No past surgeries      There were no vitals filed for this visit.  Visit Diagnosis:  Hemiparesis affecting dominant side as late effect of cerebrovascular accident [facility])      Subjective Assessment - 03/01/15 1614    Subjective  I did not cut my nails again   Pertinent History This patient is a 70 year old male who came to [facility] out patient after suffering a stroke (L thalamic infarct) on May 26th 2016. It resulted in R UE and LE weakness and facial weakness. He also demonstrates sensory loss  Patient spent one month at [facility] before going home. He recieved home health therapy. Daughter reports patient weakness worse since stoping home healt.He had been living alone, but now daughter lives at his daughter's house.,  He needs assist  with dressing, bathing, shaving, and toileting.      Limitations Walking, ADL, R UE function.   Patient Stated Goals To be able to walk better and do things "for my self."    In supine, stretched patient L upper extremity into shoulder flexion, external and internal rotation elbow flexion and extension, forearm supination and pronation, wrist flexion and  extension, and hand flexion and extension. Repeated all movements active assistive. Patient stretched into reflex inhibiting pattern through out session to aid in normalizing tone. Scapula wrist and finger mobilization completed to help with tone. Cues (verbal and physical) for positioning and technique needed.                          OT Education - 03/01/15 1615    Education provided Yes   Education Details explained why long nails are interfering with his fine motor progress.   Person(s) Educated Patient;Child(ren)  daughter   Methods Explanation;Tactile cues   Comprehension Verbalized understanding;Verbal cues required;Tactile cues required             OT Long Term Goals - 02/07/15 1659    OT LONG TERM GOAL #1   Title Patient will demonstrate ability to place one of 9 hole peg test peg to improve fine motor to be abel to fasten pants fasteners.   Baseline unable   Time 12   Period Weeks   Status New   OT LONG TERM GOAL #2   Title Will improve right hand and shoulder function sufficiently to be able to shave self.   Baseline daughter has to shave him.   Time 12   Period Weeks   Status New   OT LONG TERM GOAL #3   Title Will improve balance and left upper extremity function to be able to make a simple  snack.   Baseline Patient does no food prep.   Time 12   Period Weeks   Status New   OT LONG TERM GOAL #4   Title Will improve right hand function or learn to write with left hand so that his writing is 80% legible.    Baseline Hand writing is not legible   Time 12   Period Weeks   Status New   OT LONG TERM GOAL #5   Title Will improve R shoulder flexion to 90o to be able to move ADL objects on a table    Baseline shoulder flexion limited to 50o of flexion.   Time 12   Period Weeks   Status New               Plan - 03/01/15 1617    Clinical Impression Statement Patient progressing slowly with right hand use.  His nails are very long and  interfering with progress. Have encouraged 3 times for him to get them cut. talked with daughter as well.    Pt will benefit from skilled therapeutic intervention in order to improve on the following deficits (Retired) Decreased activity tolerance;Decreased balance;Decreased coordination;Decreased endurance;Decreased knowledge of use of DME;Decreased range of motion;Decreased strength;Difficulty walking;Impaired perceived functional ability;Impaired flexibility;Impaired UE functional use;Impaired tone;Impaired sensation   Rehab Potential Good   OT Treatment/Interventions Self-care/ADL training;Neuromuscular education;Therapeutic exercise;Energy conservation;Manual Therapy;Therapeutic activities;Therapeutic exercises;Passive range of motion   Family Member Consulted Daughter        Problem List Patient Active Problem List   Diagnosis Date Noted  . Tobacco use disorder 12/12/2014  . Hemiparesis (HCC) 12/12/2014  . 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 Chronic pain syndrome 12/04/2014  . Alterations of sensations following CVA (cerebrovascular accident) 07/10/2014  . Acute respiratory failure with hypoxia (HCC) 07/07/2014  . Abnormal urinalysis 07/07/2014  . Hypokalemia 07/07/2014  . Dehydration 07/07/2014  . Metabolic encephalopathy 07/07/2014  . Aortic root dilatation (48 mm May 2016) 07/07/2014  . 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 ICH (intracerebral hemorrhage) (HCC) 07/07/2014  . 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 Hemiplegia affecting right dominant side (HCC) 07/06/2014  . Cognitive deficit due to recent stroke 07/06/2014  . IVH (intraventricular hemorrhage) (HCC) 07/06/2014  . Essential hypertension 07/06/2014  . Intracranial hemorrhage (HCC)    [name], MS/OTR/L  [name] 03/01/2015, 4:24 PM  [place] [facility] MAIN [facility] SERVICES [address] [place], [place], [address] Phone: [phone]    Fax:  [phone]  Name: Michael Goodwin MRN: [id number] Date of Birth: [birth year]

## 2015-03-06 ENCOUNTER — Ambulatory Visit: Payer: Medicare Other | Admitting: Physical Therapy

## 2015-03-06 ENCOUNTER — Encounter: Payer: Self-pay | Admitting: Physical Therapy

## 2015-03-06 ENCOUNTER — Ambulatory Visit: Payer: Medicare Other | Admitting: Occupational Therapy

## 2015-03-06 DIAGNOSIS — R262 Difficulty in walking, not elsewhere classified: Secondary | ICD-10-CM

## 2015-03-06 DIAGNOSIS — I69359 Hemiplegia and hemiparesis following cerebral infarction affecting unspecified side: Secondary | ICD-10-CM | POA: Diagnosis not present

## 2015-03-06 DIAGNOSIS — R5383 Other fatigue: Secondary | ICD-10-CM

## 2015-03-06 NOTE — Patient Instructions (Addendum)
  Copyright  VHI. All rights reserved.  Backward Walking   Walk backward, toes of each foot coming down first. Take long, even strides. Make sure you have a clear pathway with no obstructions when you do this. Stand beside counter and walk backward  And then walk forward doing opposite directions; repeat 10 laps 2x a day at least 5 days a week.  Copyright  VHI. All rights reserved.  Tandem Walking   Stand beside kitchen sink and place one foot in front of the other, lift your hand and try to hold position for 10 sec. Repeat with other foot in front; Repeat 5 reps with each foot in front 5 days a week.Balance: Unilateral   Attempt to balance on left leg, eyes open. Hold _5-10___ seconds.Start with holding onto counter and if you get your balance you can try to let go of counter. Repeat __5__ times per set. Do __1__ sets per session. Do __1__ sessions per day. Keep eyes open:   http://orth.exer.us/29   Copyright  VHI. All rights reserved.     

## 2015-03-06 NOTE — Therapy (Signed)
[place] [facility] MAIN [facility] SERVICES [address] [place], [place], [address] Phone: [phone]   Fax:  [phone]  Occupational Therapy Treatment  Patient Details  Name: Michael Goodwin MRN: [id number] Date of Birth: 1945-10-09 Referring Provider: Loletta Parish  Encounter Date: 03/06/2015      OT End of Session - 03/06/15 1618    Visit Number 5   Number of Visits 24   Date for OT Re-Evaluation 05/02/15   Authorization Type medicare   OT Start Time 1530   OT Stop Time 1600   OT Time Calculation (min) 30 min   Behavior During Therapy [facility] for tasks assessed/performed      Past Medical History  Diagnosis Date  . Stroke (HCC)   . Essential hypertension 07/06/2014  . 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 Hemiplegia affecting right dominant side (HCC) 07/06/2014    Past Surgical History  Procedure Laterality Date  . No past surgeries      There were no vitals filed for this visit.  Visit Diagnosis:  Hemiparesis affecting dominant side as late effect of cerebrovascular accident [facility])      Subjective Assessment - 03/06/15 1616    Subjective  I did cut my nails   Pertinent History This patient is a 70 year old male who came to [facility] out patient after suffering a stroke (L thalamic infarct) on May 26th 2016. It resulted in R UE and LE weakness and facial weakness. He also demonstrates sensory loss  Patient spent one month at [facility] before going home. He recieved home health therapy. Daughter reports patient weakness worse since stoping home healt.He had been living alone, but now daughter lives at his daughter's house.,  He needs assist  with dressing, bathing, shaving, and toileting.      Patient Stated Goals To be able to walk better and do things "for my self."    In supine, stretched R upper extremity into shoulder flexion, external and internal rotation elbow flexion and extension, forearm supination and pronation, wrist  flexion and extension, and hand flexion and extension.   Repeated these motions with active assistance. weight bearing through out session to normalize tone. Patient stretched into reflex inhibiting pattern through out session to aid in normalizing tone. In sitting, grabbed cones in front of him placed to his side. Cues needed for technique.                          OT Education - 03/06/15 1617    Education provided Yes   Education Details Purpose of new HEP   Person(s) Educated Patient   Methods Explanation;Tactile cues;Verbal cues   Comprehension Verbalized understanding;Returned demonstration;Verbal cues required;Tactile cues required             OT Long Term Goals - 02/07/15 1659    OT LONG TERM GOAL #1   Title Patient will demonstrate ability to place one of 9 hole peg test peg to improve fine motor to be abel to fasten pants fasteners.   Baseline unable   Time 12   Period Weeks   Status New   OT LONG TERM GOAL #2   Title Will improve right hand and shoulder function sufficiently to be able to shave self.   Baseline daughter has to shave him.   Time 12   Period Weeks   Status New   OT LONG TERM GOAL #3   Title Will improve balance and left  upper extremity function to be able to make a simple snack.   Baseline Patient does no food prep.   Time 12   Period Weeks   Status New   OT LONG TERM GOAL #4   Title Will improve right hand function or learn to write with left hand so that his writing is 80% legible.    Baseline Hand writing is not legible   Time 12   Period Weeks   Status New   OT LONG TERM GOAL #5   Title Will improve R shoulder flexion to 90o to be able to move ADL objects on a table    Baseline shoulder flexion limited to 50o of flexion.   Time 12   Period Weeks   Status New               Plan - 03/06/15 1619    Clinical Impression Statement Patient progressing with goals including range of motion and right hand use.  Now  that he has cut his nails, he is much more free to use hand. He can oppose fingers 2 and 3.   Pt will benefit from skilled therapeutic intervention in order to improve on the following deficits (Retired) Decreased activity tolerance;Decreased balance;Decreased coordination;Decreased endurance;Decreased knowledge of use of DME;Decreased range of motion;Decreased strength;Difficulty walking;Impaired perceived functional ability;Impaired flexibility;Impaired UE functional use;Impaired tone;Impaired sensation   OT Treatment/Interventions Self-care/ADL training;Neuromuscular education;Therapeutic exercise;Energy conservation;Manual Therapy;Therapeutic activities;Therapeutic exercises;Passive range of motion        Problem List Patient Active Problem List   Diagnosis Date Noted  . Tobacco use disorder 12/12/2014  . Hemiparesis (HCC) 12/12/2014  . 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 Chronic pain syndrome 12/04/2014  . Alterations of sensations following CVA (cerebrovascular accident) 07/10/2014  . Acute respiratory failure with hypoxia (HCC) 07/07/2014  . Abnormal urinalysis 07/07/2014  . Hypokalemia 07/07/2014  . Dehydration 07/07/2014  . Metabolic encephalopathy 07/07/2014  . Aortic root dilatation (48 mm May 2016) 07/07/2014  . 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 ICH (intracerebral hemorrhage) (HCC) 07/07/2014  . 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 Hemiplegia affecting right dominant side (HCC) 07/06/2014  . Cognitive deficit due to recent stroke 07/06/2014  . IVH (intraventricular hemorrhage) (HCC) 07/06/2014  . Essential hypertension 07/06/2014  . Intracranial hemorrhage (HCC)    [name], MS/OTR/L  [name] 03/06/2015, 4:24 PM  [place] [facility] MAIN [facility] SERVICES [address] [place], [place], [address] Phone: [phone]   Fax:  [phone]  Name: Michael Goodwin MRN: [id number] Date of Birth:  July 27, 1945

## 2015-03-06 NOTE — Therapy (Signed)
[place] MAIN [facility] SERVICES [address] [place], [place], [address] Phone: [phone]   Fax:  [phone]  Physical Therapy Treatment  Patient Details  Name: Michael Goodwin MRN: [id number] Date of Birth: 18-Nov-1945 Referring Provider: Alysia Penna  Encounter Date: 03/06/2015      PT End of Session - 03/07/15 0828    Visit Number 6   Number of Visits 17   Date for PT Re-Evaluation 03/30/15   Authorization Type 2   Authorization Time Period 10   PT Start Time 1600   PT Stop Time 1645   PT Time Calculation (min) 45 min   Equipment Utilized During Treatment Gait belt   Activity Tolerance Patient tolerated treatment well   Behavior During Therapy [facility] for tasks assessed/performed      Past Medical History  Diagnosis Date  . Stroke ([place])   . Essential hypertension 07/06/2014  . Nontraumatic thalamic hemorrhage ([place]) 07/06/2014  . Hemiplegia affecting right dominant side ([place]) 07/06/2014    Past Surgical History  Procedure Laterality Date  . No past surgeries      There were no vitals filed for this visit.  Visit Diagnosis:  Difficulty walking  Lethargy      Subjective Assessment - 03/06/15 1605    Subjective Patient reports doing okay; Denies any pain; Patient reports compliance with HEP; He reports, "I walk everyday"   Pertinent History Pt sufferred a L thalamic infarct May 26,2016 with weakness in RUE/RLE as well as facial weakness. Pt also complains of R sided numbness/tingling. Pt spent a total of 30 days at [facility] and he was discharged directly home. Pt did not discharge to SNF but once home he has been receiving [place] PT. His last PT session was was last week and even since ending therapy his daughter reports he seems like he has gotten weaker. Pt was living in his own home but daughter moved him into her house so that he could receive better care. Pt reports that he has the most difficulty with transfers and  long distance ambulation.   Limitations Walking   How long can you walk comfortably? a few hundred feet   Patient Stated Goals To improve independent transfers, ambulation, and bathing.    Currently in Pain? No/denies      Warm up on Nustep level 3 BUE/BLE x4 min (unbilled);  Standing on airex: Heel/toe raises x10 with 1 rail assist; Alternate march x3 min with 1-0 rail assist and mod VCs to increase hip flexion and to step back on foam; Mini squat with 1-0 rail assist x10 with mod A for balance and mod VCs to shift hips back for increased balance;  Side stepping on airex balance beam x3 laps with 1 rail assist and mod VCs to increase erect posture/head; Tandem gait on airex balance beam with 1 rail assist x2 laps; Tandem stance on airex balance beam, 10 sec hold x2 each with 1-0 rail assist;  PT educated patient in HEP: Standing outside parallel bars: Forward/backward gait with 1 rail assist x10 feet x3laps each; Tandem stance on firm surface with 1-0 rail assist 10 sec hold x2 each foot in front; SLS on firm surface with 1-0 rail assist 5 sec hold x2 bilaterally;   Patient instructed to have daughter's help with all balance exercise at home; He required mod VCs to increase RLE terminal knee extension in stance with SLS activities;  Patient required min VCs for balance stability, including to increase trunk  control for less loss of balance with smaller base of support                           PT Education - 03/07/15 0828    Education provided Yes   Education Details balance exercise, safety with HEP performance   Person(s) Educated Patient   Methods Explanation;Tactile cues;Verbal cues   Comprehension Verbalized understanding;Returned demonstration;Verbal cues required;Tactile cues required             PT Long Term Goals - 03/01/15 1649    PT LONG TERM GOAL #1   Title Pt will be independent and compliant with HEP in order to improve strength,  transfers, and balance   Time 8   Period Weeks   Status On-going   PT LONG TERM GOAL #2   Title Pt will demonstrate increase in gait speed by at least 0.14 m/s in order to demonstrate clinically significant improvement in speed to improve function at home.   Baseline 02/01/15: 26.8 = 0.37 m/s   Time 8   Period Weeks   Status Partially Met   PT LONG TERM GOAL #3   Title Pt wil decrease TUG to <14 sec  in order to demonstrate improved LE strength and balance in order to decrease falls.    Baseline 02/01/15: 31.48 seconds   Time 8   Period Weeks   Status Revised   PT LONG TERM GOAL #4   Title patient will increase Berg Balance Assessment score by >6  points in order to demonstrate improved balance and safety with ADLs by 03/29/14   Time 4   Period Weeks   Status New   PT LONG TERM GOAL #5   Title Patient will increase 6 min walk distance by >150 feet to demonstrate improved functional gait ability with home/community ambulation. by 03/29/14   Time 4   Period Weeks   Status New               Plan - 03/07/15 6659    Clinical Impression Statement Instructed patient in advanced balance exercise. patient continues to demonstrate decreased RLE weight shift with poor quad control. PT provided increased verbal and tactile cues to increase RLE terminal knee extension in stance for better stance control with balance activities such as SLS/tandem stance. He would benefit from additional skilled PT intervention to improve balance and gait safety;    Pt will benefit from skilled therapeutic intervention in order to improve on the following deficits Abnormal gait;Decreased balance;Decreased cognition;Decreased knowledge of precautions;Decreased mobility;Decreased range of motion;Decreased safety awareness;Decreased strength;Difficulty walking;Hypomobility;Impaired tone;Impaired UE functional use   Rehab Potential Good   Clinical Impairments Affecting Rehab Potential Positive: motivation, family  suppport, current progress, Negative: decreased compliance with HEP, RUE/RLE weakness   PT Frequency 2x / week   PT Duration 8 weeks   PT Treatment/Interventions ADLs/Self Care Home Management;Aquatic Therapy;Biofeedback;Electrical Stimulation;Iontophoresis 52m/ml Dexamethasone;Traction;Ultrasound;Contrast Bath;DME Instruction;Gait training;Stair training;Functional mobility training;Therapeutic activities;Therapeutic exercise;Balance training;Neuromuscular re-education;Cognitive remediation;Patient/family education;Orthotic Fit/Training;Manual techniques;Energy conservation   PT Next Visit Plan work on quad control on RArimoadvanced- see patient instructions;    Consulted and Agree with Plan of Care Patient;Family member/caregiver   Family Member Consulted Daughter        Problem List Patient Active Problem List   Diagnosis Date Noted  . Tobacco use disorder 12/12/2014  . Hemiparesis ([place] 12/12/2014  . Basal ganglia hemorrhage ([place] 12/12/2014  . Chronic pain syndrome  12/04/2014  . Alterations of sensations following CVA (cerebrovascular accident) 07/10/2014  . Acute respiratory failure with hypoxia ([place]) 07/07/2014  . Abnormal urinalysis 07/07/2014  . Hypokalemia 07/07/2014  . Dehydration 07/07/2014  . Metabolic encephalopathy 70/17/7939  . Aortic root dilatation (48 mm May 2016) 07/07/2014  . Ascending aorta dilatation (7m May 2016) 07/07/2014  . ICH (intracerebral hemorrhage) ([place] 07/07/2014  . Nontraumatic thalamic hemorrhage ([place] 07/06/2014  . Hemiplegia affecting right dominant side ([place] 07/06/2014  . Cognitive deficit due to recent stroke 07/06/2014  . IVH (intraventricular hemorrhage) ([place] 07/06/2014  . Essential hypertension 07/06/2014  . Intracranial hemorrhage ([place]     Tomothy Eddins PT, DPT 03/07/2015, 8:30 AM  [place] [facility] [place] [address]Phone:  [phone]  Fax:  [phone] Name: CGibril MastroMRN: [id number] of Birth: [birth year]

## 2015-03-06 NOTE — Patient Instructions (Signed)
Instructed in home program for self stretch of shoulder.

## 2015-03-08 ENCOUNTER — Ambulatory Visit: Payer: Medicare Other | Admitting: Physical Therapy

## 2015-03-08 ENCOUNTER — Ambulatory Visit: Payer: Medicare Other | Admitting: Occupational Therapy

## 2015-03-08 ENCOUNTER — Encounter: Payer: Self-pay | Admitting: Occupational Therapy

## 2015-03-08 ENCOUNTER — Encounter: Payer: Self-pay | Admitting: Physical Therapy

## 2015-03-08 DIAGNOSIS — R262 Difficulty in walking, not elsewhere classified: Secondary | ICD-10-CM

## 2015-03-08 DIAGNOSIS — I69359 Hemiplegia and hemiparesis following cerebral infarction affecting unspecified side: Secondary | ICD-10-CM | POA: Diagnosis not present

## 2015-03-08 DIAGNOSIS — R5383 Other fatigue: Secondary | ICD-10-CM

## 2015-03-08 NOTE — Patient Instructions (Signed)
Instructed patient to be faithful in home program.

## 2015-03-08 NOTE — Therapy (Signed)
[place] MAIN [facility] SERVICES [address] [place], [place], [address] Phone: [phone]   Fax:  [phone]  Physical Therapy Treatment  Patient Details  Name: Michael Goodwin MRN: [id number] Date of Birth: [birth year] Referring Provider: Alysia Penna  Encounter Date: 03/08/2015      PT End of Session - 03/08/15 1606    Visit Number 7   Number of Visits 17   Date for PT Re-Evaluation 03/30/15   Authorization Type 3   Authorization Time Period 10   PT Start Time 1600   PT Stop Time 1645   PT Time Calculation (min) 45 min   Equipment Utilized During Treatment Gait belt   Activity Tolerance Patient tolerated treatment well   Behavior During Therapy [facility] for tasks assessed/performed      Past Medical History  Diagnosis Date  . Stroke ([place])   . Essential hypertension 07/06/2014  . 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 Hemiplegia affecting right dominant side ([place]) 07/06/2014    Past Surgical History  Procedure Laterality Date  . No past surgeries      There were no vitals filed for this visit.  Visit Diagnosis:  Difficulty walking  Lethargy      Subjective Assessment - 03/08/15 1605    Subjective Patient reports doing some of his balance exercises. He reports having some difficulty with SLS tasks. Patient denies any pain currently    Pertinent History Pt sufferred a L thalamic infarct May 26,2016 with weakness in RUE/RLE as well as facial weakness. Pt also complains of R sided numbness/tingling. Pt spent a total of 30 days at [facility] and he was discharged directly home. Pt did not discharge to SNF but once home he has been receiving [facility] PT. His last PT session was was last week and even since ending therapy his daughter reports he seems like he has gotten weaker. Pt was living in his own home but daughter moved him into her house so that he could receive better care. Pt reports that he has the most  difficulty with transfers and long distance ambulation.   Limitations Walking   How long can you walk comfortably? a few hundred feet   Patient Stated Goals To improve independent transfers, ambulation, and bathing.    Currently in Pain? No/denies            [facility] PT Assessment - 03/08/15 0001    6 minute walk test results    Aerobic Endurance Distance Walked 230   Endurance additional comments with hemiwalker; required sitting rest break after 3 min; had to stop at 5 min due to fatigue; slighlty improved from last reassessment on 02/07/15 which was 160 feet with hemi walker;        Warm up on Nustep BLE only level 2 x3 min (Unbilled);  Standing with red tband around both legs: Hip abduction 2x15 bilaterally; Hamstring curl x15 bilaterally with min VCs to increase knee flexion with hip extension; Side stepping 10 feet x3 laps each direction;  HOIST BLE leg press 90# x10 HOIST LLE Leg press 45# 2x10; Patient required min VCs to slow down RLE movement and to reduce knee valgus for better quad control;  Patient required min-moderate verbal/tactile cues for correct exercise technique.  Standing on RLE, toe taps with LLE on 4 inch step with 2-1 rail assist 2x10 with min tactile cues to increase RLE terminal knee extension for increased control; Ascend/descend 4 steps x2 with B rail assist, forward  reciprocal with mod VCs for correct technique and to improve forward weight shift for increased balance;  PT instructed patient in 6 min walk test; see above for results;                       PT Education - 03/08/15 1606    Education provided Yes   Education Details RLE quad strengthening, gait safety   Person(s) Educated Patient   Methods Explanation;Verbal cues   Comprehension Verbalized understanding;Returned demonstration;Verbal cues required             PT Long Term Goals - 03/01/15 1649    PT LONG TERM GOAL #1   Title Pt will be independent and  compliant with HEP in order to improve strength, transfers, and balance   Time 8   Period Weeks   Status On-going   PT LONG TERM GOAL #2   Title Pt will demonstrate increase in gait speed by at least 0.14 m/s in order to demonstrate clinically significant improvement in speed to improve function at home.   Baseline 02/01/15: 26.8 = 0.37 m/s   Time 8   Period Weeks   Status Partially Met   PT LONG TERM GOAL #3   Title Pt wil decrease TUG to <14 sec  in order to demonstrate improved LE strength and balance in order to decrease falls.    Baseline 02/01/15: 31.48 seconds   Time 8   Period Weeks   Status Revised   PT LONG TERM GOAL #4   Title patient will increase Berg Balance Assessment score by >6  points in order to demonstrate improved balance and safety with ADLs by 03/29/14   Time 4   Period Weeks   Status New   PT LONG TERM GOAL #5   Title Patient will increase 6 min walk distance by >150 feet to demonstrate improved functional gait ability with home/community ambulation. by 03/29/14   Time 4   Period Weeks   Status New               Plan - 03/08/15 1650    Clinical Impression Statement Instructed patient in advanced strengthening exercise. Patient requires mod VCs to increase RLE quad control, particularly in stance.He would benefit from additional skilled PT Intervention to improve LE strength and balance;    Pt will benefit from skilled therapeutic intervention in order to improve on the following deficits Abnormal gait;Decreased balance;Decreased cognition;Decreased knowledge of precautions;Decreased mobility;Decreased range of motion;Decreased safety awareness;Decreased strength;Difficulty walking;Hypomobility;Impaired tone;Impaired UE functional use   Rehab Potential Good   Clinical Impairments Affecting Rehab Potential Positive: motivation, family suppport, current progress, Negative: decreased compliance with HEP, RUE/RLE weakness   PT Frequency 2x / week   PT  Duration 8 weeks   PT Treatment/Interventions ADLs/Self Care Home Management;Aquatic Therapy;Biofeedback;Electrical Stimulation;Iontophoresis 54m/ml Dexamethasone;Traction;Ultrasound;Contrast Bath;DME Instruction;Gait training;Stair training;Functional mobility training;Therapeutic activities;Therapeutic exercise;Balance training;Neuromuscular re-education;Cognitive remediation;Patient/family education;Orthotic Fit/Training;Manual techniques;Energy conservation   PT Next Visit Plan work on quad control on RPenndelcontinue as previously given;    Consulted and Agree with Plan of Care Patient;Family member/caregiver   Family Member Consulted Daughter        Problem List Patient Active Problem List   Diagnosis Date Noted  . Tobacco use disorder 12/12/2014  . Hemiparesis ([place] 12/12/2014  . Basal ganglia hemorrhage (HCoving[place] 12/12/2014  . Chronic pain syndrome 12/04/2014  . Alterations of sensations following CVA (cerebrovascular accident) 07/10/2014  . Acute respiratory failure with  hypoxia ([place]) 07/07/2014  . Abnormal urinalysis 07/07/2014  . Hypokalemia 07/07/2014  . Dehydration 07/07/2014  . Metabolic encephalopathy 44/69/5072  . Aortic root dilatation (48 mm May 2016) 07/07/2014  . 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 ICH (intracerebral hemorrhage) ([place]Con[place] 07/07/2014  . Nontraumatic thalamic hemorrhage (HWest [place] 07/06/2014  . Hemiplegia affecting right dominant side (HLong[place] 07/06/2014  . Cognitive deficit due to recent stroke 07/06/2014  . IVH (intraventricular hemorrhage) ([facility] 07/06/2014  . Essential hypertension 07/06/2014  . Intracranial hemorrhage (HJohnson     Trotter,Margaret PT, DPT 03/08/2015, 4:51 PM  [place] [facility] CenterSERVICES [address] [place] [address]Phone: [phone]  Fax:  [phone] Name: CQuay SimkinMRN: [id number] of Birth: 6August 20, 1947

## 2015-03-08 NOTE — Therapy (Signed)
[place] [facility] MAIN [facility] SERVICES [address] [place], [place], [address] Phone: [phone]   Fax:  [phone]  Occupational Therapy Treatment  Patient Details  Name: Michael Goodwin MRN: [id number] Date of Birth: 1945/12/10 Referring Provider: Loletta Parish  Encounter Date: 03/08/2015      OT End of Session - 03/08/15 1614    Visit Number 6   Number of Visits 24   Date for OT Re-Evaluation 05/02/15   Authorization Type medicare   OT Start Time 1515   OT Stop Time 1600   OT Time Calculation (min) 45 min   Behavior During Therapy [facility] for tasks assessed/performed      Past Medical History  Diagnosis Date  . Stroke (H[facility])   . Essential hypertension 07/06/2014  . 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 Hemiplegia affecting right dominant side (HCC) 07/06/2014    Past Surgical History  Procedure Laterality Date  . No past surgeries      There were no vitals filed for this visit.  Visit Diagnosis:  Hemiparesis affecting dominant side as late effect of cerebrovascular accident [facility])      Subjective Assessment - 03/08/15 1610    Subjective  expressed some frustration with his hand.   Pertinent History This patient is a 70 year old male who came to [facility] out patient after suffering a stroke (L thalamic infarct) on May 26th 2016. It resulted in R UE and LE weakness and facial weakness. He also demonstrates sensory loss  Patient spent one month at [facility] before going home. He recieved home health therapy. Daughter reports patient weakness worse since stoping home healt.He had been living alone, but now daughter lives at his daughter's house.,  He needs assist  with dressing, bathing, shaving, and toileting.      Limitations Walking, ADL, R UE function.   Patient Stated Goals To be able to walk better and do things "for my self."   Multiple Pain Sites No    Mobilization of scapula, wrist and fingers through out session. In  supine, R upper extremity stretching shoulder flexion, external and internal rotation elbow flexion and extension, forearm supination and pronation, wrist flexion and extension, and hand flexion and extension. Repeated this with active assistive assist.  weight bearing through out session to help normalize tone. Patient stretched into reflex inhibiting pattern through out session to aid in normalizing tone. In sitting placed 3/4 inch pegs in peg board. (first time able to do this.  Grabbing cones in front of him and stacking to the left. Cues needed for technique and reminding him to look at his hand.                           OT Education - 03/08/15 1613    Education provided Yes   Education Details techniques for fine motor   Person(s) Educated Patient   Methods Explanation;Tactile cues;Verbal cues   Comprehension Returned demonstration             OT Long Term Goals - 02/07/15 1659    OT LONG TERM GOAL #1   Title Patient will demonstrate ability to place one of 9 hole peg test peg to improve fine motor to be abel to fasten pants fasteners.   Baseline unable   Time 12   Period Weeks   Status New   OT LONG TERM GOAL #2   Title Will improve right hand and shoulder function  sufficiently to be able to shave self.   Baseline daughter has to shave him.   Time 12   Period Weeks   Status New   OT LONG TERM GOAL #3   Title Will improve balance and left upper extremity function to be able to make a simple snack.   Baseline Patient does no food prep.   Time 12   Period Weeks   Status New   OT LONG TERM GOAL #4   Title Will improve right hand function or learn to write with left hand so that his writing is 80% legible.    Baseline Hand writing is not legible   Time 12   Period Weeks   Status New   OT LONG TERM GOAL #5   Title Will improve R shoulder flexion to 90o to be able to move ADL objects on a table    Baseline shoulder flexion limited to 50o of flexion.    Time 12   Period Weeks   Status New               Plan - 03/08/15 1615    Clinical Impression Statement Pateint making progress with hand function as shown by ability to place 3/4 inch pegs in peg holes and not just pull them out. He does have to position with left hand.   Pt will benefit from skilled therapeutic intervention in order to improve on the following deficits (Retired) Decreased activity tolerance;Decreased balance;Decreased coordination;Decreased endurance;Decreased knowledge of use of DME;Decreased range of motion;Decreased strength;Difficulty walking;Impaired perceived functional ability;Impaired flexibility;Impaired UE functional use;Impaired tone;Impaired sensation   Rehab Potential Good   OT Frequency 2x / week   OT Treatment/Interventions Self-care/ADL training;Neuromuscular education;Therapeutic exercise;Energy conservation;Manual Therapy;Therapeutic activities;Therapeutic exercises;Passive range of motion        Problem List Patient Active Problem List   Diagnosis Date Noted  . Tobacco use disorder 12/12/2014  . Hemiparesis (HCC) 12/12/2014  . 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 Chronic pain syndrome 12/04/2014  . Alterations of sensations following CVA (cerebrovascular accident) 07/10/2014  . Acute respiratory failure with hypoxia (HCC) 07/07/2014  . Abnormal urinalysis 07/07/2014  . Hypokalemia 07/07/2014  . Dehydration 07/07/2014  . Metabolic encephalopathy 07/07/2014  . Aortic root dilatation (48 mm May 2016) 07/07/2014  . 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 ICH (intracerebral hemorrhage) (HCC) 07/07/2014  . 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 Hemiplegia affecting right dominant side (HCC) 07/06/2014  . Cognitive deficit due to recent stroke 07/06/2014  . IVH (intraventricular hemorrhage) (HCC) 07/06/2014  . Essential hypertension 07/06/2014  . Intracranial hemorrhage (HCC)    [name],  MS/OTR/L  [name] 03/08/2015, 4:20 PM  [place] [facility] MAIN [facility] SERVICES [address], [place], [address] Phone: [phone]   Fax:  [phone]  Name: Michael Goodwin MRN: [id number] Date of Birth: Nov 26, 1945

## 2015-03-13 ENCOUNTER — Encounter: Payer: Self-pay | Admitting: Physical Therapy

## 2015-03-13 ENCOUNTER — Ambulatory Visit: Payer: Medicare Other | Admitting: Occupational Therapy

## 2015-03-13 ENCOUNTER — Encounter: Payer: Self-pay | Admitting: Occupational Therapy

## 2015-03-13 ENCOUNTER — Ambulatory Visit: Payer: Medicare Other | Admitting: Physical Therapy

## 2015-03-13 DIAGNOSIS — R5383 Other fatigue: Secondary | ICD-10-CM

## 2015-03-13 DIAGNOSIS — I69359 Hemiplegia and hemiparesis following cerebral infarction affecting unspecified side: Secondary | ICD-10-CM

## 2015-03-13 DIAGNOSIS — R262 Difficulty in walking, not elsewhere classified: Secondary | ICD-10-CM

## 2015-03-13 NOTE — Therapy (Signed)
[place] [facility] MAIN [facility] SERVICES [address] [place], [place], [address] Phone: [phone]   Fax:  [phone]  Occupational Therapy Treatment  Patient Details  Name: Michael Goodwin MRN: [id number] Date of Birth: 06-01-1945 Referring Provider: Loletta Parish  Encounter Date: 03/13/2015      OT End of Session - 03/13/15 1614    Visit Number 7   Number of Visits 24   Date for OT Re-Evaluation 05/02/15   OT Start Time 1515   OT Stop Time 1600   OT Time Calculation (min) 45 min      Past Medical History  Diagnosis Date  . Stroke (HCC)   . Essential hypertension 07/06/2014  . 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 Hemiplegia affecting right dominant side (HCC) 07/06/2014    Past Surgical History  Procedure Laterality Date  . No past surgeries      There were no vitals filed for this visit.  Visit Diagnosis:  Hemiparesis affecting dominant side as late effect of cerebrovascular accident [facility])      Subjective Assessment - 03/13/15 1604    Subjective  Reports today that stretching does not hurt.   Pertinent History This patient is a 70 year old male who came to [facility] out patient after suffering a stroke (L thalamic infarct) on May 26th 2016. It resulted in R UE and LE weakness and facial weakness. He also demonstrates sensory loss  Patient spent one month at [facility] before going home. He recieved home health therapy. Daughter reports patient weakness worse since stoping home healt.He had been living alone, but now daughter lives at his daughter's house.,  He needs assist  with dressing, bathing, shaving, and toileting.      Limitations Walking, ADL, R UE function.    In supine stretched R upper extremity into shoulder flexion, external and internal rotation elbow flexion and extension, forearm supination and pronation, wrist flexion and extension, and hand flexion and extension.  Repeated these motions with active assist.  Patient stretched into reflex inhibiting pattern through out session to aid in normalizing tone. Mobilization of scapula, wrist and fingers. In sitting grabbed cones from therapist and place to his right. Constant cueing to look at hand and open hand, and for techniques.                            OT Education - 03/13/15 1613    Education provided Yes   Education Details Instructed why it is important to do home program.   Person(s) Educated Patient   Methods Explanation;Verbal cues   Comprehension Verbalized understanding             OT Long Term Goals - 02/07/15 1659    OT LONG TERM GOAL #1   Title Patient will demonstrate ability to place one of 9 hole peg test peg to improve fine motor to be abel to fasten pants fasteners.   Baseline unable   Time 12   Period Weeks   Status New   OT LONG TERM GOAL #2   Title Will improve right hand and shoulder function sufficiently to be able to shave self.   Baseline daughter has to shave him.   Time 12   Period Weeks   Status New   OT LONG TERM GOAL #3   Title Will improve balance and left upper extremity function to be able to make a simple snack.   Baseline Patient does  no food prep.   Time 12   Period Weeks   Status New   OT LONG TERM GOAL #4   Title Will improve right hand function or learn to write with left hand so that his writing is 80% legible.    Baseline Hand writing is not legible   Time 12   Period Weeks   Status New   OT LONG TERM GOAL #5   Title Will improve R shoulder flexion to 90o to be able to move ADL objects on a table    Baseline shoulder flexion limited to 50o of flexion.   Time 12   Period Weeks   Status New               Plan - 03/13/15 1615    Clinical Impression Statement Patient progressing with UE goals and can use UE more freely as long as he is stretched throub out session in reflex inhibiting pattern. He can move objects from midline to his right, with significant  cues to remember to watch hand.   Pt will benefit from skilled therapeutic intervention in order to improve on the following deficits (Retired) Decreased activity tolerance;Decreased balance;Decreased coordination;Decreased endurance;Decreased knowledge of use of MRY;Decreased range of motion;Decreased strength;Difficulty walking;Impaired perceived functional ability;Impaired flexibility;Impaired HUT functional use;Impaired tone;Impaired sensation   OTC Treatment/Interventions Self-care/AD training;Neuromuscular education;Therapeutic exercise;Energy conservation;Manual Therapy;Therapeutic activities;Therapeutic exercises;Passive range of motion        Problem List Patient Active Problem List   Diagnosis Date Noted  . Tobacco use disorder 12/12/2014  . Hemiparesis (HCC) 12/12/2014  . 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 Chronic pain syndrome 12/04/2014  . Alterations of sensations following CVA (cerebrovascular accident) 07/10/2014  . Acute respiratory failure with hypoxia (HCC) 07/07/2014  . Abnormal urinalysis 07/07/2014  . Hypokalemia 07/07/2014  . Dehydration 07/07/2014  . Metabolic encephalopathy 07/07/2014  . Aortic root dilatation (48 mm May 2016) 07/07/2014  . 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 ICH (intracerebral hemorrhage) (HCC) 07/07/2014  . 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 Hemiplegia affecting right dominant side (HCC) 07/06/2014  . Cognitive deficit due to recent stroke 07/06/2014  . IVH (intraventricular hemorrhage) (HCC) 07/06/2014  . Essential hypertension 07/06/2014  . Intracranial hemorrhage (HCC)    [name], MS/OTR/L  [name] 03/13/2015, 4:21 PM  [place] [facility] MAIN [facility] SERVICES [address] [place], [place], [address] Phone: [phone]   Fax:  [phone]  Name: Michael Goodwin MRM: [id number] Date of Birth: [birth year]

## 2015-03-13 NOTE — Patient Instructions (Signed)
Instructed to be faithful with home program

## 2015-03-13 NOTE — Therapy (Signed)
[place] MAIN [facility] SERVICES [address] [place], [place], [address] Phone: [phone]   Fax:  [phone]  Physical Therapy Treatment  Patient Details  Name: Michael Goodwin MRN: [id number] Date of Birth: [birth year] Referring Provider: Alysia Penna  Encounter Date: 03/13/2015      PT End of Session - 03/13/15 1637    Visit Number 8   Number of Visits 17   Date for PT Re-Evaluation 03/30/15   Authorization Type 4   Authorization Time Period 10   PT Start Time 1600   PT Stop Time 1645   PT Time Calculation (min) 45 min   Equipment Utilized During Treatment Gait belt   Activity Tolerance Patient tolerated treatment well;Patient limited by fatigue   Behavior During Therapy [facility] for tasks assessed/performed      Past Medical History  Diagnosis Date  . Stroke ([place])   . Essential hypertension 07/06/2014  . 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 Hemiplegia affecting right dominant side ([place]) 07/06/2014    Past Surgical History  Procedure Laterality Date  . No past surgeries      There were no vitals filed for this visit.  Visit Diagnosis:  Hemiparesis affecting dominant side as late effect of cerebrovascular accident [facility]  Difficulty walking  Lethargy      Subjective Assessment - 03/13/15 1605    Subjective Patient reports no new changes; Reports no pain today; Reports compliance with HEP;    Pertinent History Pt sufferred a L thalamic infarct May 26,2016 with weakness in RUE/RLE as well as facial weakness. Pt also complains of R sided numbness/tingling. Pt spent a total of 30 days at [facility] and he was discharged directly home. Pt did not discharge to SNF but once home he has been receiving [facility]. His last PT session was was last week and even since ending therapy his daughter reports he seems like he has gotten weaker. Pt was living in his own home but daughter moved him into her house so that he  could receive better care. Pt reports that he has the most difficulty with transfers and long distance ambulation.   Limitations Walking   How long can you walk comfortably? a few hundred feet   Patient Stated Goals To improve independent transfers, ambulation, and bathing.    Currently in Pain? No/denies        TREATMENT: Warm up on Nustep, BLE level 1 x4 min (unbilled);  Standing with red tband around both legs: Hip abduction x15 bilaterally; Hip extension x15 bilaterally with min VCs to increase knee extension with hip extension; RLE terminal knee extension with red tband x15 with mod VCs to slow down knee extension for increased quad control;  Stepping forward/backward over 1/2 bolster with hemiwalker and min A for balance x10 each;   HOIST LLE Leg press 45# x20 x15; Patient required min VCs to slow down RLE movement and to reduce knee valgus for better quad control;  Patient required min-moderate verbal/tactile cues for correct exercise technique.   Gait with hemiwalker 100 feet x2 reps with close supervision- CGA and min VCs to make sure to keep hemiwalker out to side for increased room for LLE to avoid scissoring and improve gait safety;                          PT Education - 03/13/15 1637    Education provided Yes   Education Details LE strengthening,  gait safety   Person(s) Educated Patient   Methods Explanation;Verbal cues   Comprehension Verbalized understanding;Returned demonstration;Verbal cues required             PT Long Term Goals - 03/01/15 1649    PT LONG TERM GOAL #1   Title Pt will be independent and compliant with HEP in order to improve strength, transfers, and balance   Time 8   Period Weeks   Status On-going   PT LONG TERM GOAL #2   Title Pt will demonstrate increase in gait speed by at least 0.14 m/s in order to demonstrate clinically significant improvement in speed to improve function at home.   Baseline 02/01/15: 26.8  = 0.37 m/s   Time 8   Period Weeks   Status Partially Met   PT LONG TERM GOAL #3   Title Pt wil decrease TUG to <14 sec  in order to demonstrate improved LE strength and balance in order to decrease falls.    Baseline 02/01/15: 31.48 seconds   Time 8   Period Weeks   Status Revised   PT LONG TERM GOAL #4   Title patient will increase Berg Balance Assessment score by >6  points in order to demonstrate improved balance and safety with ADLs by 03/29/14   Time 4   Period Weeks   Status New   PT LONG TERM GOAL #5   Title Patient will increase 6 min walk distance by >150 feet to demonstrate improved functional gait ability with home/community ambulation. by 03/29/14   Time 4   Period Weeks   Status New               Plan - 03/13/15 1637    Clinical Impression Statement Instructed patient in advanced BLE strengthening exercise particularly focusing on LLE quad strengthening. Patient able to initiate RLE knee extension better today but has difficulty maintaining due to fatigue. He would benefit from additional skilled PT intervention to improve LE strength, balance and gait safety;    Pt will benefit from skilled therapeutic intervention in order to improve on the following deficits Abnormal gait;Decreased balance;Decreased cognition;Decreased knowledge of precautions;Decreased mobility;Decreased range of motion;Decreased safety awareness;Decreased strength;Difficulty walking;Hypomobility;Impaired tone;Impaired UE functional use   Rehab Potential Good   Clinical Impairments Affecting Rehab Potential Positive: motivation, family suppport, current progress, Negative: decreased compliance with HEP, RUE/RLE weakness   PT Frequency 2x / week   PT Duration 8 weeks   PT Treatment/Interventions ADLs/Self Care Home Management;Aquatic Therapy;Biofeedback;Electrical Stimulation;Iontophoresis 18m/ml Dexamethasone;Traction;Ultrasound;Contrast Bath;DME Instruction;Gait training;Stair  training;Functional mobility training;Therapeutic activities;Therapeutic exercise;Balance training;Neuromuscular re-education;Cognitive remediation;Patient/family education;Orthotic Fit/Training;Manual techniques;Energy conservation   PT Next Visit Plan work on quad control on RMcMullencontinue as previously given;    Consulted and Agree with Plan of Care Patient;Family member/caregiver   Family Member Consulted Daughter        Problem List Patient Active Problem List   Diagnosis Date Noted  . Tobacco use disorder 12/12/2014  . Hemiparesis ([place] 12/12/2014  . 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 Chronic pain syndrome 12/04/2014  . Alterations of sensations following CVA (cerebrovascular accident) 07/10/2014  . Acute respiratory failure with hypoxia (HNew [place]abury 07/07/2014  . Abnormal urinalysis 07/07/2014  . Hypokalemia 07/07/2014  . Dehydration 07/07/2014  . Metabolic encephalopathy 040/09/6759 . Aortic root dilatation (48 mm May 2016) 07/07/2014  . Ascending aorta dilatation (594mMay 2016) 07/07/2014  . ICH (intracerebral hemorrhage) (HCBloomingburg05/27/2016  . Nontraumatic thalamic hemorrhage (HCFranklin05/26/2016  .  Hemiplegia affecting right dominant side ([place]) 07/06/2014  . Cognitive deficit due to recent stroke 07/06/2014  . IVH (intraventricular hemorrhage) ([place]) 07/06/2014  . Essential hypertension 07/06/2014  . Intracranial hemorrhage ([place])     Trotter,Margaret PT, DPT 03/13/2015, 4:44 PM  [place] MAIN [facility] SERVICES [address] [place], [place], [address] Phone: [phone]   Fax:  [phone]  Name: Michael Goodwin MRN: [id number] Date of Birth: [birth year]

## 2015-03-15 ENCOUNTER — Ambulatory Visit: Payer: Medicare Other | Admitting: Physical Therapy

## 2015-03-15 ENCOUNTER — Ambulatory Visit: Payer: Medicare Other | Admitting: Occupational Therapy

## 2015-03-20 ENCOUNTER — Ambulatory Visit: Payer: Medicare Other | Attending: Physical Medicine & Rehabilitation | Admitting: Physical Therapy

## 2015-03-20 ENCOUNTER — Ambulatory Visit: Payer: Medicare Other | Admitting: Occupational Therapy

## 2015-03-20 ENCOUNTER — Encounter: Payer: Self-pay | Admitting: Physical Therapy

## 2015-03-20 DIAGNOSIS — R531 Weakness: Secondary | ICD-10-CM | POA: Diagnosis present

## 2015-03-20 DIAGNOSIS — R262 Difficulty in walking, not elsewhere classified: Secondary | ICD-10-CM | POA: Diagnosis present

## 2015-03-20 DIAGNOSIS — R5383 Other fatigue: Secondary | ICD-10-CM | POA: Insufficient documentation

## 2015-03-20 DIAGNOSIS — M6281 Muscle weakness (generalized): Secondary | ICD-10-CM | POA: Diagnosis present

## 2015-03-20 DIAGNOSIS — R279 Unspecified lack of coordination: Secondary | ICD-10-CM | POA: Diagnosis present

## 2015-03-20 DIAGNOSIS — R460 Very low level of personal hygiene: Secondary | ICD-10-CM | POA: Insufficient documentation

## 2015-03-20 DIAGNOSIS — I69898 Other sequelae of other cerebrovascular disease: Secondary | ICD-10-CM | POA: Diagnosis present

## 2015-03-21 NOTE — Therapy (Signed)
[place] MAIN [facility] SERVICES [address] [place], [place], [address] Phone: [phone]   Fax:  [phone]  Physical Therapy Treatment  Patient Details  Name: Michael Goodwin MRN: [id number] Date of Birth: [birth year] Referring Provider: Alysia Penna  Encounter Date: 03/20/2015      PT End of Session - 03/21/15 0849    Visit Number 9   Number of Visits 17   Date for PT Re-Evaluation 03/30/15   Authorization Type 5   Authorization Time Period 10   PT Start Time 1605   PT Stop Time 1645   PT Time Calculation (min) 40 min   Equipment Utilized During Treatment Gait belt   Activity Tolerance Patient tolerated treatment well;Patient limited by fatigue   Behavior During Therapy [facility] for tasks assessed/performed      Past Medical History  Diagnosis Date  . Stroke ([place])   . Essential hypertension 07/06/2014  . 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 Hemiplegia affecting right dominant side ([place]) 07/06/2014    Past Surgical History  Procedure Laterality Date  . No past surgeries      There were no vitals filed for this visit.  Visit Diagnosis:  Difficulty walking  Lethargy      Subjective Assessment - 03/20/15 1611    Subjective "I just don't feel quite right today" Patient reports increased weakness on right side today; He denies any new falls; He reports walking at home but forgetting to do his exercises at home all the time;    Pertinent History Pt sufferred a L thalamic infarct May 26,2016 with weakness in RUE/RLE as well as facial weakness. Pt also complains of R sided numbness/tingling. Pt spent a total of 30 days at [facility] and he was discharged directly home. Pt did not discharge to SNF but once home he has been receiving [place] PT. His last PT session was was last week and even since ending therapy his daughter reports he seems like he has gotten weaker. Pt was living in his own home but daughter moved him  into her house so that he could receive better care. Pt reports that he has the most difficulty with transfers and long distance ambulation.   Limitations Walking   How long can you walk comfortably? a few hundred feet   Patient Stated Goals To improve independent transfers, ambulation, and bathing.    Currently in Pain? No/denies      TREATMENT: Warm up on Nustep LUE/BLE level 3 x4 min (Unbilled);  Resisted weighted gait, 7.5# forward/backward, side/side (2 way) x2 laps each with min A and hemiwalker;Patient required min VCS to increase step length for better gait safety; Also required min VCs to avoid crossing RLE over LLE with side stepping;  Forward step over 1/2 and whole bolster #3, x2 reps with hemiwalker;  Sit<>Stand without HHA x10 with mod VCs to increase forward trunk lean and to push through BLE for better transfer ability;  SLS on firm surface without rail assist 5 sec hold with min A x2 each LE: Tandem stance with 1-0 rail assist 10 sec hold x2 each LE in front;  Gait without AD, 10 feet x1, 20 feet x1 without AD with min A for balance;   Patient required min VCs for balance stability, including to increase trunk control for less loss of balance with smaller base of support He expressed desire to walk more without AD. Patient does require min A for gait safety without AD with cues  to increase foot clearance for less loss of balance;                            PT Education - 03/21/15 0849    Education provided Yes   Education Details LE strengthening, gait safety   Person(s) Educated Patient   Methods Explanation;Verbal cues   Comprehension Verbalized understanding;Returned demonstration;Verbal cues required             PT Long Term Goals - 03/01/15 1649    PT LONG TERM GOAL #1   Title Pt will be independent and compliant with HEP in order to improve strength, transfers, and balance   Time 8   Period Weeks   Status On-going   PT LONG  TERM GOAL #2   Title Pt will demonstrate increase in gait speed by at least 0.14 m/s in order to demonstrate clinically significant improvement in speed to improve function at home.   Baseline 02/01/15: 26.8 = 0.37 m/s   Time 8   Period Weeks   Status Partially Met   PT LONG TERM GOAL #3   Title Pt wil decrease TUG to <14 sec  in order to demonstrate improved LE strength and balance in order to decrease falls.    Baseline 02/01/15: 31.48 seconds   Time 8   Period Weeks   Status Revised   PT LONG TERM GOAL #4   Title patient will increase Berg Balance Assessment score by >6  points in order to demonstrate improved balance and safety with ADLs by 03/29/14   Time 4   Period Weeks   Status New   PT LONG TERM GOAL #5   Title Patient will increase 6 min walk distance by >150 feet to demonstrate improved functional gait ability with home/community ambulation. by 03/29/14   Time 4   Period Weeks   Status New               Plan - 03/21/15 0850    Clinical Impression Statement Instructed patient in strengthening exercise. Also educated patient in static and dynamic balance exercise. He continues to require cues to increase RLE knee extension in stance for better control with SLS tasks. Patient also requires cues to increase RLE hip flexion for better foot clearance with gait especially when negotiating bolsters and other obstacles. He would benefit from additional skilled PT Intervention to improve LE strength, balance and gait safety;    Pt will benefit from skilled therapeutic intervention in order to improve on the following deficits Abnormal gait;Decreased balance;Decreased cognition;Decreased knowledge of precautions;Decreased mobility;Decreased range of motion;Decreased safety awareness;Decreased strength;Difficulty walking;Hypomobility;Impaired tone;Impaired UE functional use   Rehab Potential Good   Clinical Impairments Affecting Rehab Potential Positive: motivation, family suppport,  current progress, Negative: decreased compliance with HEP, RUE/RLE weakness   PT Frequency 2x / week   PT Duration 8 weeks   PT Treatment/Interventions ADLs/Self Care Home Management;Aquatic Therapy;Biofeedback;Electrical Stimulation;Iontophoresis 80m/ml Dexamethasone;Traction;Ultrasound;Contrast Bath;DME Instruction;Gait training;Stair training;Functional mobility training;Therapeutic activities;Therapeutic exercise;Balance training;Neuromuscular re-education;Cognitive remediation;Patient/family education;Orthotic Fit/Training;Manual techniques;Energy conservation   PT Next Visit Plan work on quad control on RYoung Harriscontinue as previously given;    Consulted and Agree with Plan of Care Patient;Family member/caregiver   Family Member Consulted Daughter        Problem List Patient Active Problem List   Diagnosis Date Noted  . Tobacco use disorder 12/12/2014  . Hemiparesis ([place]Mill[place] 12/12/2014  . Basal ganglia hemorrhage (HCC)  12/12/2014  . Chronic pain syndrome 12/04/2014  . Alterations of sensations following CVA (cerebrovascular accident) 07/10/2014  . Acute respiratory failure with hypoxia ([place]) 07/07/2014  . Abnormal urinalysis 07/07/2014  . Hypokalemia 07/07/2014  . Dehydration 07/07/2014  . Metabolic encephalopathy 77/82/4235  . Aortic root dilatation (48 mm May 2016) 07/07/2014  . Ascending aorta dilatation (45m May 2016) 07/07/2014  . ICH (intracerebral hemorrhage) (HLake Shore 07/07/2014  . Nontraumatic thalamic hemorrhage (HTerry 07/06/2014  . Hemiplegia affecting right dominant side (HFairwater 07/06/2014  . Cognitive deficit due to recent stroke 07/06/2014  . IVH (intraventricular hemorrhage) (HHal[place] 07/06/2014  . Essential hypertension 07/06/2014  . Intracranial hemorrhage (HShongopovi     Trotter,Margaret PT, DPT 03/21/2015, 8:52 AM  [place] [facility] [address] [address]Phone: [phone]  Fax:   [phone] Name: Michael HanauerMRN: [id number] of Birth: 614-Jan-1947

## 2015-03-21 NOTE — Therapy (Signed)
[facility] MAIN [facility] SERVICES [address] [place], [place], [address] Phone: [phone]   Fax:  [phone]  Occupational Therapy Treatment  Patient Details  Name: Michael Goodwin MRN: [id number] Date of Birth: [birth year] Referring Provider: Loletta Parish  Encounter Date: 03/20/2015      OT End of Session - 03/21/15 0917    Visit Number 8   Number of Visits 24   Date for OT Re-Evaluation 05/02/15   Authorization Type medicare   OT Start Time 1530   OT Stop Time 1605   OT Time Calculation (min) 35 min      Past Medical History  Diagnosis Date  . Stroke (HCC)   . Essential hypertension 07/06/2014  . 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 Hemiplegia affecting right dominant side (HCC) 07/06/2014    Past Surgical History  Procedure Laterality Date  . No past surgeries      There were no vitals filed for this visit.  Visit Diagnosis:  Muscle weakness      Subjective Assessment - 03/21/15 0849    Subjective  Pt. reports he hasn't been feeling well today in general.    Pertinent History This patient is a 70 year old male who came to [facility] out patient after suffering a stroke (L thalamic infarct) on May 26th 2016. It resulted in R UE and LE weakness and facial weakness. He also demonstrates sensory loss  Patient spent one month at [facility] before going home. He recieved home health therapy. Daughter reports patient weakness worse since stoping home healt.He had been living alone, but now daughter lives at his daughter's house.,  He needs assist  with dressing, bathing, shaving, and toileting.      Limitations Walking, ADL, R UE function.   Patient Stated Goals To be able to walk better and do things "for my self."                      OT Treatments/Exercises (OP) - 03/21/15 6962    ADLs   UB Dressing UE dressing performed. Pt. was able to independently doff jacket. Pt. does require CGA to initiate doffing  sleeve over hand.   Neurological Re-education Exercises   Other Exercises 1 Pt. performed PROM in right UE in reflex inhibiting patterns to reduce tone and prepare UE for functional use.   Other Weight-Bearing Exercises 1 Pt. performed reaching tasks crosssing midline with weightbearing through RUE.  Pt. requires assist to maintain weightbearing through RUE while reaching with LUE. Pt. also performed reaching tasks  with the right UE . Support proximally was required during the functional reaching attempts   Manual Therapy   Manual Therapy Joint mobilization   Manual therapy comments Pt. performed scapular mobilizations in sidelying for right elevation, depression, abduction to normalize tone in UE and prepare for ROM and functional use.                 OT Education - 03/21/15 0915    Education provided Yes   Education Details Pt. was provided with education about proper ROM and effective ROM and weightbearing techniques.   Person(s) Educated Patient   Methods Explanation   Comprehension Verbalized understanding;Returned demonstration;Verbal cues required;Tactile cues required             OT Long Term Goals - 02/07/15 1659    OT LONG TERM GOAL #1   Title Patient will demonstrate ability to place one of 9 hole peg test  peg to improve fine motor to be abel to fasten pants fasteners.   Baseline unable   Time 12   Period Weeks   Status New   OT LONG TERM GOAL #2   Title Will improve right hand and shoulder function sufficiently to be able to shave self.   Baseline daughter has to shave him.   Time 12   Period Weeks   Status New   OT LONG TERM GOAL #3   Title Will improve balance and left upper extremity function to be able to make a simple snack.   Baseline Patient does no food prep.   Time 12   Period Weeks   Status New   OT LONG TERM GOAL #4   Title Will improve right hand function or learn to write with left hand so that his writing is 80% legible.    Baseline Hand  writing is not legible   Time 12   Period Weeks   Status New   OT LONG TERM GOAL #5   Title Will improve R shoulder flexion to 90o to be able to move ADL objects on a table    Baseline shoulder flexion limited to 50o of flexion.   Time 12   Period Weeks   Status New               Plan - 03/21/15 1610    Clinical Impression Statement Pt. has made progress with OT treatment, however continues to require skilled OT services to improve RUE tone, ROM, strength, coordination, and hand function in order to be able to engage his R arm actively in functional ADL and IADL tasks.   Pt will benefit from skilled therapeutic intervention in order to improve on the following deficits (Retired) Decreased coordination;Decreased endurance;Decreased balance;Impaired UE functional use;Impaired tone;Decreased strength   Rehab Potential Good   Clinical Impairments Affecting Rehab Potential RUE impaired tone, ROM, strength, coordination, limiting use during ADL functional tasks.   OT Frequency 2x / week   OT Duration 12 weeks   OT Treatment/Interventions Self-care/ADL training;Neuromuscular education;Therapeutic exercise;Energy conservation;Manual Therapy;Therapeutic activities;Therapeutic exercises;Passive range of motion   Plan Continue to improve and normalize tone, ROM,and strength for functional reaching.    Consulted and Agree with Plan of Care Patient        Problem List Patient Active Problem List   Diagnosis Date Noted  . Tobacco use disorder 12/12/2014  . Hemiparesis (HCC) 12/12/2014  . 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 Chronic pain syndrome 12/04/2014  . Alterations of sensations following CVA (cerebrovascular accident) 07/10/2014  . Acute respiratory failure with hypoxia (HCC) 07/07/2014  . Abnormal urinalysis 07/07/2014  . Hypokalemia 07/07/2014  . Dehydration 07/07/2014  . Metabolic encephalopathy 07/07/2014  . Aortic root dilatation (48 mm May 2016) 07/07/2014  .  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 ICH (intracerebral hemorrhage) (HCC) 07/07/2014  . 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 Hemiplegia affecting right dominant side (HCC) 07/06/2014  . Cognitive deficit due to recent stroke 07/06/2014  . IVH (intraventricular hemorrhage) (HCC) 07/06/2014  . Essential hypertension 07/06/2014  . Intracranial hemorrhage (HCC)    Olegario Messier, MS, OTR/L  Olegario Messier 03/21/2015, 9:28 AM  [place] [facility] MAIN [facility] SERVICES [address] [place], [place], [address] Phone: [phone]   Fax:  [phone]  Name: Ishmel Acevedo MRN: [id number] Date of Birth: 1945/03/24

## 2015-03-22 ENCOUNTER — Ambulatory Visit: Payer: Medicare Other | Admitting: Occupational Therapy

## 2015-03-22 ENCOUNTER — Encounter: Payer: Self-pay | Admitting: Occupational Therapy

## 2015-03-22 ENCOUNTER — Ambulatory Visit: Payer: Medicare Other | Admitting: Physical Therapy

## 2015-03-22 ENCOUNTER — Encounter: Payer: Self-pay | Admitting: Physical Therapy

## 2015-03-22 DIAGNOSIS — R262 Difficulty in walking, not elsewhere classified: Secondary | ICD-10-CM

## 2015-03-22 DIAGNOSIS — IMO0002 Reserved for concepts with insufficient information to code with codable children: Secondary | ICD-10-CM

## 2015-03-22 DIAGNOSIS — M6281 Muscle weakness (generalized): Secondary | ICD-10-CM

## 2015-03-22 DIAGNOSIS — R5383 Other fatigue: Secondary | ICD-10-CM

## 2015-03-22 DIAGNOSIS — R279 Unspecified lack of coordination: Secondary | ICD-10-CM

## 2015-03-22 NOTE — Therapy (Signed)
[place] MAIN [facility] SERVICES [address] [place], [place], [address] Phone: [phone]   Fax:  [phone]  Occupational Therapy Progress Note  Patient Details  Name: Michael Goodwin MRN: [id number] Date of Birth: 22-Feb-1945 Referring Provider: [name]  Encounter Date: 03/22/2015    Past Medical History  Diagnosis Date  . Stroke ([place])   . Essential hypertension 07/06/2014  . 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 Hemiplegia affecting right dominant side ([place]) 07/06/2014    Past Surgical History  Procedure Laterality Date  . No past surgeries      There were no vitals filed for this visit.  Visit Diagnosis:  Weakness due to cerebrovascular accident  Lack of coordination      Subjective Assessment - 03/22/15 1621    Subjective  Pt. reports feeling better today in general.    Pertinent History This patient is a 70 year old male who came to [facility] out patient after suffering a stroke (L thalamic infarct) on May 26th 2016. It resulted in R UE and LE weakness and facial weakness. He also demonstrates sensory loss  Patient spent one month at [facility] before going home. He recieved home health therapy. Daughter reports patient weakness worse since stoping home healt.He had been living alone, but now daughter lives at his daughter's house.,  He needs assist  with dressing, bathing, shaving, and toileting.      Patient Stated Goals To be able to use his RUE for functional tasks.   Currently in Pain? No/denies            [facility] OT Assessment - 03/22/15 1641    AROM   Overall AROM Comments Shoulder flexion in supine 87/110.  Abduction in supine: 70/90    Right Shoulder Flexion 58 Degrees sitting   Right Shoulder ABduction 56 Degrees sitting   Right Wrist Extension -5 Degrees  -5/24 wrist extension                  OT Treatments/Exercises (OP) - 03/22/15 1626    ADLs   Grooming Pt. required CGA for hand  care at sinkside.   Toileting Pt. required CGA  perform sit to stand, minA for balance in standing at the toilet. Pt. was able to fasten pants and zip jeans without assist using bilateral hands.   Fine Motor Coordination   Other Fine Motor Exercises 9 hole peg test administered  Pt. was able to initiate grasp on peg from a horzontal position, however was unable to translate the peg in his hand fto a vertical position iin preparation f or placing the peg into the hole.    Neurological Re-education Exercises   Other Exercises 1 Pt. performed AAROM exercises using the UE Ranger for shoulder flexion, abduction, and external rotation. Pt. required support at elbow initially, and heavy cueing for desired movement pattern.    Manual Therapy   Manual Therapy Joint mobilization   Manual therapy comments Pt. performed scapular mobilizations in elevation, depression, abduction in preparation for ROM and functional use of RUE.                      OT Long Term Goals - 03/22/15 1714    OT LONG TERM GOAL #1   Title Patient will demonstrate ability to place one of 9 hole peg test peg to improve fine motor to be able to fasten pants fasteners.   Baseline unable   Time 12   Period  Weeks   Status Partially met   OT LONG TERM GOAL #2   Title Will improve right hand and shoulder function sufficiently to be able to shave self.   Baseline daughter has to shave him.   Time 12   Period Weeks   Status Partially Met   OT LONG TERM GOAL #3   Title Will improve balance and left upper extremity function to be able to make a simple snack.   Baseline Patient does no food prep.   Time 12   Period Weeks   Status New   OT LONG TERM GOAL #4   Title Will improve right hand function or learn to write with left hand so that his writing is 80% legible.    Baseline Hand writing is not legible   Time 12   Period Weeks   Status Partially Met   OT LONG TERM GOAL #5   Title Will improve R shoulder flexion to  90o to be able to move ADL objects on a table    Baseline shoulder flexion limited to 50o of flexion.   Time 12   Period Weeks   Status Partially Met   Long Term Additional Goals   Additional Long Term Goals Yes   OT LONG TERM GOAL #6   Title Pt. will increase right wrist extension by 10 degrees to be able to use grooming care items.   Baseline -5/24   Time 8   Period Weeks   Status New               Plan - 06-Apr-2015 1712    Pt will benefit from skilled therapeutic intervention in order to improve on the following deficits (Retired) Decreased coordination;Decreased endurance;Decreased balance;Impaired UE functional use;Impaired tone;Decreased strength   Rehab Potential Good   Clinical Impairments Affecting Rehab Potential RUE impaired tone, ROM, strength, coordination, limiting use during ADL functional tasks.   OT Frequency 2x / week   OT Duration 12 weeks   OT Treatment/Interventions Neuromuscular education;Therapeutic exercise;Energy conservation;Manual Therapy;Therapeutic activities;Therapeutic exercises;Passive range of motion   Consulted and Agree with Plan of Care Patient          G-Codes - 2015-04-06 1720    Functional Assessment Tool Used Clinical observation, clinical judgement, and ojective measurement data.   Functional Limitation Self care   Self Care Current Status [phone]) At least 40 percent but less than 60 percent impaired, limited or restricted   Self Care Goal Status (D6222) At least 20 percent but less than 40 percent impaired, limited or restricted      Problem List Patient Active Problem List   Diagnosis Date Noted  . Tobacco use disorder 12/12/2014  . Hemiparesis ([place]) 12/12/2014  . 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 Chronic pain syndrome 12/04/2014  . Alterations of sensations following CVA (cerebrovascular accident) 07/10/2014  . Acute respiratory failure with hypoxia ([place]) 07/07/2014  . Abnormal urinalysis 07/07/2014  .  Hypokalemia 07/07/2014  . Dehydration 07/07/2014  . Metabolic encephalopathy 97/98/9211  . Aortic root dilatation (48 mm May 2016) 07/07/2014  . Ascending aorta dilatation (28m May 2016) 07/07/2014  . ICH (intracerebral hemorrhage) ([place] 07/07/2014  . Nontraumatic thalamic hemorrhage ([place] 07/06/2014  . Hemiplegia affecting right dominant side ([place] 07/06/2014  . Cognitive deficit due to recent stroke 07/06/2014  . IVH (intraventricular hemorrhage) ([place] 07/06/2014  . Essential hypertension 07/06/2014  . Intracranial hemorrhage ([place]    EHarrel Carina MS, OTR/L  EHarrel Carina2Feb 24, 2017 5:38 PM  [facility]  [place] MAIN [facility] SERVICES [place], [place], [address] Phone: [phone]   Fax:  [phone]  Name: Jontay Maston MRN: [id number] Date of Birth: 05/04/1945

## 2015-03-23 ENCOUNTER — Encounter: Payer: Self-pay | Admitting: Physical Therapy

## 2015-03-23 ENCOUNTER — Other Ambulatory Visit: Payer: Self-pay

## 2015-03-23 DIAGNOSIS — R972 Elevated prostate specific antigen [PSA]: Secondary | ICD-10-CM

## 2015-03-23 NOTE — Therapy (Signed)
[place] MAIN [facility] SERVICES [address] [place], [place], [address] Phone: [phone]   Fax:  [phone]  Physical Therapy Treatment  Patient Details  Name: Michael Goodwin MRN: [id number] Date of Birth: 05-08-1945 Referring Provider: Alysia Penna  Encounter Date: 03/22/2015      PT End of Session - 03/22/15 1625    Visit Number 10   Number of Visits 17   Date for PT Re-Evaluation 03/30/15   Authorization Type 6   Authorization Time Period 10   PT Start Time 1600   PT Stop Time 1645   PT Time Calculation (min) 45 min   Equipment Utilized During Treatment Gait belt   Activity Tolerance Patient tolerated treatment well;Patient limited by fatigue   Behavior During Therapy [facility] for tasks assessed/performed      Past Medical History  Diagnosis Date  . Stroke ([place])   . Essential hypertension 07/06/2014  . 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 Hemiplegia affecting right dominant side ([place]) 07/06/2014    Past Surgical History  Procedure Laterality Date  . No past surgeries      There were no vitals filed for this visit.  Visit Diagnosis:  Muscle weakness  Difficulty walking  Lethargy      Subjective Assessment - 03/22/15 1606    Subjective "I am a little tired today" He reports doing all his exercise at home.    Pertinent History Pt sufferred a L thalamic infarct May 26,2016 with weakness in RUE/RLE as well as facial weakness. Pt also complains of R sided numbness/tingling. Pt spent a total of 30 days at [facility] and he was discharged directly home. Pt did not discharge to SNF but once home he has been receiving [facility]. His last PT session was was last week and even since ending therapy his daughter reports he seems like he has gotten weaker. Pt was living in his own home but daughter moved him into her house so that he could receive better care. Pt reports that he has the most difficulty with transfers  and long distance ambulation.   Limitations Walking   How long can you walk comfortably? a few hundred feet   Patient Stated Goals To improve independent transfers, ambulation, and bathing.    Currently in Pain? No/denies        TREATMENT: Ascend/descend 4 steps with B rail assist, forward non-reciprocal x2 sets with min VCS for correct technique and min A for balance;  Gait around gym 50 feet x3 without AD, min A for balance;  Standing in parallel bars: Green tband around both legs: Hip abduction 2x10 bilaterally; Side stepping 10 feet x3 laps each direction; Required mod VCs to avoid trunk rotation and to increase RLE hip abduction for better hip strengthening;  Leg press: BLE:90# x12 with min VCs to reduce knee valgus for better quad strengthening RLE only plate 45# [id number]; required min VCs to slow down LE movement and to increase terminal knee extension;  Patient prone: Hip extension x10 bilaterally with AAROM on RLE; Glute max hip extension x15 bilaterally with AAROM on RLE  Supine: SLR flexion x15 on RLE; Hip flexion march with green tband x15 bilaterally; Patient required mod tactile cues to increase ROM for better strengthening;    Patient required min-moderate verbal/tactile cues for correct exercise technique.                        PT Education - 03/22/15  69    Education provided Yes   Education Details LE strengthening exercise, HEP   Person(s) Educated Patient   Methods Explanation;Verbal cues   Comprehension Verbalized understanding;Returned demonstration;Verbal cues required             PT Long Term Goals - 03/01/15 1649    PT LONG TERM GOAL #1   Title Pt will be independent and compliant with HEP in order to improve strength, transfers, and balance   Time 8   Period Weeks   Status On-going   PT LONG TERM GOAL #2   Title Pt will demonstrate increase in gait speed by at least 0.14 m/s in order to demonstrate clinically  significant improvement in speed to improve function at home.   Baseline 02/01/15: 26.8 = 0.37 m/s   Time 8   Period Weeks   Status Partially Met   PT LONG TERM GOAL #3   Title Pt wil decrease TUG to <14 sec  in order to demonstrate improved LE strength and balance in order to decrease falls.    Baseline 02/01/15: 31.48 seconds   Time 8   Period Weeks   Status Revised   PT LONG TERM GOAL #4   Title patient will increase Berg Balance Assessment score by >6  points in order to demonstrate improved balance and safety with ADLs by 03/29/14   Time 4   Period Weeks   Status New   PT LONG TERM GOAL #5   Title Patient will increase 6 min walk distance by >150 feet to demonstrate improved functional gait ability with home/community ambulation. by 03/29/14   Time 4   Period Weeks   Status New               Plan - 03/22/15 1625    Clinical Impression Statement Advanced LE strengthening exercise with increased resistance; Patient continues to fatigue quickly requiring increased cues to stay motivated to take and to increase ROM for better strengthening; Patient required mod VCS to slow down LE movement especially with eccentric return for better motor control and strength. He would benefit from additional PT interventoin to improve gait safety and LE strength for improved functional mobility;    Pt will benefit from skilled therapeutic intervention in order to improve on the following deficits Abnormal gait;Decreased balance;Decreased cognition;Decreased knowledge of precautions;Decreased mobility;Decreased range of motion;Decreased safety awareness;Decreased strength;Difficulty walking;Hypomobility;Impaired tone;Impaired UE functional use   Rehab Potential Good   Clinical Impairments Affecting Rehab Potential Positive: motivation, family suppport, current progress, Negative: decreased compliance with HEP, RUE/RLE weakness   PT Frequency 2x / week   PT Duration 8 weeks   PT  Treatment/Interventions ADLs/Self Care Home Management;Aquatic Therapy;Biofeedback;Electrical Stimulation;Iontophoresis 58m/ml Dexamethasone;Traction;Ultrasound;Contrast Bath;DME Instruction;Gait training;Stair training;Functional mobility training;Therapeutic activities;Therapeutic exercise;Balance training;Neuromuscular re-education;Cognitive remediation;Patient/family education;Orthotic Fit/Training;Manual techniques;Energy conservation   PT Next Visit Plan work on quad control on [place] as previously given;    Consulted and Agree with Plan of Care Patient;Family member/caregiver   Family Member Consulted Daughter        Problem List Patient Active Problem List   Diagnosis Date Noted  . Tobacco use disorder 12/12/2014  . Hemiparesis ([place] 12/12/2014  . Basal ganglia hemorrhage ([place] 12/12/2014  . Chronic pain syndrome 12/04/2014  . Alterations of sensations following CVA (cerebrovascular accident) 07/10/2014  . Acute respiratory failure with hypoxia ([place] 07/07/2014  . Abnormal urinalysis 07/07/2014  . Hypokalemia 07/07/2014  . Dehydration 07/07/2014  . Metabolic encephalopathy 086/76/1950 .  Aortic root dilatation (48 mm May 2016) 07/07/2014  . Ascending aorta dilatation (69m May 2016) 07/07/2014  . ICH (intracerebral hemorrhage) ([place] 07/07/2014  . Nontraumatic thalamic hemorrhage ([place] 07/06/2014  . Hemiplegia affecting right dominant side ([place] 07/06/2014  . Cognitive deficit due to recent stroke 07/06/2014  . IVH (intraventricular hemorrhage) (HM[place] 07/06/2014  . Essential hypertension 07/06/2014  . Intracranial hemorrhage ([place]     Jenasis Straley PT, DPT 03/23/2015, 7:51 AM  [place] [facility] [address] [address]Phone: [phone]  Fax:  [phone] Name: Michael CampilloMRN: [id number] of Birth: 610/19/1947

## 2015-03-26 ENCOUNTER — Other Ambulatory Visit: Payer: Medicare Other

## 2015-03-27 ENCOUNTER — Ambulatory Visit: Payer: Medicare Other | Admitting: Occupational Therapy

## 2015-03-27 ENCOUNTER — Ambulatory Visit: Payer: Medicare Other | Admitting: Physical Therapy

## 2015-03-27 ENCOUNTER — Encounter: Payer: Self-pay | Admitting: Physical Therapy

## 2015-03-27 DIAGNOSIS — R5383 Other fatigue: Secondary | ICD-10-CM

## 2015-03-27 DIAGNOSIS — R262 Difficulty in walking, not elsewhere classified: Secondary | ICD-10-CM | POA: Diagnosis not present

## 2015-03-27 DIAGNOSIS — R531 Weakness: Secondary | ICD-10-CM

## 2015-03-27 DIAGNOSIS — R460 Very low level of personal hygiene: Secondary | ICD-10-CM

## 2015-03-27 DIAGNOSIS — Z741 Need for assistance with personal care: Secondary | ICD-10-CM

## 2015-03-27 DIAGNOSIS — R279 Unspecified lack of coordination: Secondary | ICD-10-CM

## 2015-03-27 DIAGNOSIS — M6281 Muscle weakness (generalized): Secondary | ICD-10-CM

## 2015-03-27 NOTE — Therapy (Signed)
[place] MAIN [facility] SERVICES [address] [place], [place], [address] Phone: [phone]   Fax:  [phone]  Physical Therapy Treatment  Patient Details  Name: Michael Goodwin MRN: [id number] Date of Birth: 1945-05-17 Referring Provider: Alysia Penna  Encounter Date: 03/27/2015      PT End of Session - 03/27/15 1650    Visit Number 11   Number of Visits 17   Date for PT Re-Evaluation 03/30/15   Authorization Type 7   Authorization Time Period 10   PT Start Time 1402   PT Stop Time 1445   PT Time Calculation (min) 43 min   Equipment Utilized During Treatment Gait belt   Activity Tolerance Patient limited by fatigue   Behavior During Therapy [facility] for tasks assessed/performed      Past Medical History  Diagnosis Date  . Stroke ([place])   . Essential hypertension 07/06/2014  . 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 Hemiplegia affecting right dominant side ([place]) 07/06/2014    Past Surgical History  Procedure Laterality Date  . No past surgeries      There were no vitals filed for this visit.  Visit Diagnosis:  Muscle weakness  Difficulty walking  Lethargy      Subjective Assessment - 03/27/15 1608    Subjective Patient seems fatigued today; He reports right leg pain;    Pertinent History Pt sufferred a L thalamic infarct May 26,2016 with weakness in RUE/RLE as well as facial weakness. Pt also complains of R sided numbness/tingling. Pt spent a total of 30 days at [facility] and he was discharged directly home. Pt did not discharge to SNF but once home he has been receiving [place] [facility]. His last PT session was was last week and even since ending therapy his daughter reports he seems like he has gotten weaker. Pt was living in his own home but daughter moved him into her house so that he could receive better care. Pt reports that he has the most difficulty with transfers and long distance ambulation.   Limitations  Walking   How long can you walk comfortably? a few hundred feet   Patient Stated Goals To improve independent transfers, ambulation, and bathing.    Currently in Pain? Yes   Pain Score 3    Pain Location Leg   Pain Orientation Right      TREATMENT: Warm up on Nustep LUE/BLE level 3 x4 min (Unbilled);  Resisted weighted gait, 12.5# forward/backward, side/side (2 way) x2 laps each with mod A without AD;Patient required min VCS to increase step length for better gait safety; Also required min VCs to avoid crossing RLE over LLE with side stepping;  Standing on airex x2: Feet apart: eyes open/closed 10 sec hold x4 each; Feet together with LUE ball pass left to right and right to left with RUE off rails x10 each direction with mod A for balance and mod VCs for weight shift;   Sit<>Stand without HHA x10 , CGA for safety and  mod VCs to increase forward trunk lean and to push through BLE for better transfer ability;  Weaving around 5 cones without AD x 2 sets with mod A for balance and mod VCs to pick feet up for better foot clearance with steps;  Picking up 5 cones x1 reps with mod A for balance;  Gait without AD, 20 feet x3 reps, 50 feet x2 without AD with mod A for balance; Patient became fatigued at end of session  having increased difficulty picking up right foot requiring increased assistance with gait;    Patient required min VCs for balance stability, including to increase trunk control for less loss of balance with smaller base of support                            PT Education - 03/27/15 1649    Education provided Yes   Education Details balance exercise, gait safety;    Person(s) Educated Patient   Methods Explanation;Verbal cues   Comprehension Verbalized understanding;Returned demonstration;Verbal cues required             PT Long Term Goals - 03/01/15 1649    PT LONG TERM GOAL #1   Title Pt will be independent and compliant with HEP in order  to improve strength, transfers, and balance   Time 8   Period Weeks   Status On-going   PT LONG TERM GOAL #2   Title Pt will demonstrate increase in gait speed by at least 0.14 m/s in order to demonstrate clinically significant improvement in speed to improve function at home.   Baseline 02/01/15: 26.8 = 0.37 m/s   Time 8   Period Weeks   Status Partially Met   PT LONG TERM GOAL #3   Title Pt wil decrease TUG to <14 sec  in order to demonstrate improved LE strength and balance in order to decrease falls.    Baseline 02/01/15: 31.48 seconds   Time 8   Period Weeks   Status Revised   PT LONG TERM GOAL #4   Title patient will increase Berg Balance Assessment score by >6  points in order to demonstrate improved balance and safety with ADLs by 03/29/14   Time 4   Period Weeks   Status New   PT LONG TERM GOAL #5   Title Patient will increase 6 min walk distance by >150 feet to demonstrate improved functional gait ability with home/community ambulation. by 03/29/14   Time 4   Period Weeks   Status New               Plan - 03/27/15 1650    Clinical Impression Statement Patient very fatigued today; PT instructed patient in balance exercise; Patient required mod VCS to improve weight shift for improved balance. He had increased difficulty with right foot clearance due to weakness. Patient would benefit from additional skilled PT intervention to improve balance/gait safety; Will address goals next visit;    Pt will benefit from skilled therapeutic intervention in order to improve on the following deficits Abnormal gait;Decreased balance;Decreased cognition;Decreased knowledge of precautions;Decreased mobility;Decreased range of motion;Decreased safety awareness;Decreased strength;Difficulty walking;Hypomobility;Impaired tone;Impaired UE functional use   Rehab Potential Good   Clinical Impairments Affecting Rehab Potential Positive: motivation, family suppport, current progress, Negative:  decreased compliance with HEP, RUE/RLE weakness   PT Frequency 2x / week   PT Duration 8 weeks   PT Treatment/Interventions ADLs/Self Care Home Management;Aquatic Therapy;Biofeedback;Electrical Stimulation;Iontophoresis 49m/ml Dexamethasone;Traction;Ultrasound;Contrast Bath;DME Instruction;Gait training;Stair training;Functional mobility training;Therapeutic activities;Therapeutic exercise;Balance training;Neuromuscular re-education;Cognitive remediation;Patient/family education;Orthotic Fit/Training;Manual techniques;Energy conservation   PT Next Visit Plan address goals;    PT Home Exercise Plan continue as previously given;    Consulted and Agree with Plan of Care Patient;Family member/caregiver   Family Member Consulted Daughter        Problem List Patient Active Problem List   Diagnosis Date Noted  . Tobacco use disorder 12/12/2014  . Hemiparesis ([place] 12/12/2014  .  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 Chronic pain syndrome 12/04/2014  . Alterations of sensations following CVA (cerebrovascular accident) 07/10/2014  . Acute respiratory failure with hypoxia ([place]) 07/07/2014  . Abnormal urinalysis 07/07/2014  . Hypokalemia 07/07/2014  . Dehydration 07/07/2014  . Metabolic encephalopathy 38/68/5488  . Aortic root dilatation (48 mm May 2016) 07/07/2014  . Ascending aorta dilatation (7m May 2016) 07/07/2014  . ICH (intracerebral hemorrhage) ([place] 07/07/2014  . Nontraumatic thalamic hemorrhage ([place] 07/06/2014  . Hemiplegia affecting right dominant side ([place] 07/06/2014  . Cognitive deficit due to recent stroke 07/06/2014  . IVH (intraventricular hemorrhage) ([place] 07/06/2014  . Essential hypertension 07/06/2014  . Intracranial hemorrhage ([place]     Herman Fiero PT, DPT 03/27/2015, 4:52 PM  [place] [facility] CenterSERV[facility] [address]RW[address] [address]Phone: [phone]  Fax:  [phone] Name: CSuheyb RaucciMRN:  [id number] of Birth: 6July 12, 1947

## 2015-03-28 ENCOUNTER — Encounter: Payer: Self-pay | Admitting: Occupational Therapy

## 2015-03-28 ENCOUNTER — Other Ambulatory Visit: Payer: Medicare Other

## 2015-03-28 DIAGNOSIS — R972 Elevated prostate specific antigen [PSA]: Secondary | ICD-10-CM

## 2015-03-28 NOTE — Therapy (Signed)
[place] MAIN [facility] SERVICES [address] [place], [place], [address] Phone: [phone]   Fax:  [phone]  Occupational Therapy Treatment  Patient Details  Name: Michael Goodwin MRN: [id number] Date of Birth: March 07, 1945 Referring Provider: [name]  Encounter Date: 03/27/2015      OT End of Session - 03/28/15 0833    Visit Number 9   Number of Visits 24   Date for OT Re-Evaluation 05/02/15   OT Start Time 1530   OT Stop Time 1600   OT Time Calculation (min) 30 min      Past Medical History  Diagnosis Date  . Stroke ([place])   . Essential hypertension 07/06/2014  . 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 Hemiplegia affecting right dominant side ([place]) 07/06/2014    Past Surgical History  Procedure Laterality Date  . No past surgeries      There were no vitals filed for this visit.  Visit Diagnosis:  Weakness  Lack of coordination  Self-care deficit for dressing and grooming      Subjective Assessment - 03/28/15 0812    Subjective  Pt. reports occasionally having pain in his elbow and hand. Pain has been intermittent over this past weekend. No reports of pain today.   Pertinent History This patient is a 70 year old male who came to [facility] out patient after suffering a stroke (L thalamic infarct) on May 26th 2016. It resulted in R UE and LE weakness and facial weakness. He also demonstrates sensory loss  Patient spent one month at [facility] before going home. He recieved home health therapy. Daughter reports patient weakness worse since stoping home healt.He had been living alone, but now daughter lives at his daughter's house.,  He needs assist  with dressing, bathing, shaving, and toileting.      Limitations Walking, ADL, R UE function.   Patient Stated Goals To be able to use his RUE for functional tasks.   Currently in Pain? No/denies   Pain Score 0-No pain                      OT  Treatments/Exercises (OP) - 03/28/15 9675    Transfers   Sit to Stand With upper extremity assist;4: Min guard   ADLs   Grooming Pt. performed simulated shaving tasks using an electric razor in front of the mirror. Pt. was able to maintain grip on the razor, however required support and stabilization at his elbow and forearm to perform the motion on the right side of his face, and increased assist was required  on the left.    Fine Motor Coordination   Other Fine Motor Exercises --   Neurological Re-education Exercises   Other Exercises 1 Pt. performed RUE AAROM  using the UE Ranger. Pt. performed shoulder flexion, protraction,  abduction, and external rotation. Pt. required support at his elbow, verbal cues, and visual cues using a mirror.   Manual Therapy   Manual Therapy Joint mobilization   Manual therapy comments Pt. performed scapular mobilizations in elevation, depression, abduction in preparation for ROM and functional use of RUE.                 OT Education - 03/28/15 [phone]    Education provided Yes   Education Details RUE ROM and functioning.   Person(s) Educated Patient   Methods Explanation;Verbal cues   Comprehension Verbalized understanding;Returned demonstration;Tactile cues required;Verbal cues required  OT Long Term Goals - 03/22/15 1714    OT LONG TERM GOAL #1   Title Patient will demonstrate ability to place one of 9 hole peg test peg to improve fine motor to be abel to fasten pants fasteners.   Baseline unable   Time 12   Period Weeks   Status Not Met   OT LONG TERM GOAL #2   Title Will improve right hand and shoulder function sufficiently to be able to shave self.   Baseline daughter has to shave him.   Time 12   Period Weeks   Status Partially Met   OT LONG TERM GOAL #3   Title Will improve balance and left upper extremity function to be able to make a simple snack.   Baseline Patient does no food prep.   Time 12   Period Weeks    Status New   OT LONG TERM GOAL #4   Title Will improve right hand function or learn to write with left hand so that his writing is 80% legible.    Baseline Hand writing is not legible   Time 12   Period Weeks   Status Partially Met   OT LONG TERM GOAL #5   Title Will improve R shoulder flexion to 90o to be able to move ADL objects on a table    Baseline shoulder flexion limited to 50o of flexion.   Time 12   Period Weeks   Status Partially Met   Long Term Additional Goals   Additional Long Term Goals Yes   OT LONG TERM GOAL #6   Title Pt. will increase right wrist extension by 10 degrees to be able to use grooming care items.   Baseline -5/24   Time 8   Period Weeks   Status New               Plan - 03/28/15 6226    Clinical Impression Statement Pt. is starting to engage his RUE in functional tasks. Pt. does require proximal support of his RUE, and heavy verbal, tactile, and visual cues for hand to face patterns for grooming tasks. Pt. right wrist AROM  has improved to neutral. Pt. continues to require skilled OT services to improve RUE ROM, strength, and coordination to optimize performance in ADL and IADL tasks.   Pt will benefit from skilled therapeutic intervention in order to improve on the following deficits (Retired) Decreased coordination;Decreased endurance;Decreased balance;Impaired UE functional use;Impaired tone;Decreased strength   Rehab Potential Good   Clinical Impairments Affecting Rehab Potential RUE impaired tone, ROM, strength, coordination, limiting use during ADL functional tasks.   OT Frequency 2x / week   OT Duration 12 weeks   OT Treatment/Interventions Neuromuscular education;Therapeutic exercise;Energy conservation;Manual Therapy;Therapeutic activities;Therapeutic exercises;Passive range of motion   Consulted and Agree with Plan of Care Patient   Family Member Consulted Daughter        Problem List Patient Active Problem List   Diagnosis Date  Noted  . Tobacco use disorder 12/12/2014  . Hemiparesis ([place]) 12/12/2014  . 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 Chronic pain syndrome 12/04/2014  . Alterations of sensations following CVA (cerebrovascular accident) 07/10/2014  . Acute respiratory failure with hypoxia ([place]) 07/07/2014  . Abnormal urinalysis 07/07/2014  . Hypokalemia 07/07/2014  . Dehydration 07/07/2014  . Metabolic encephalopathy 33/35/4562  . Aortic root dilatation (48 mm May 2016) 07/07/2014  . Ascending aorta dilatation (68m May 2016) 07/07/2014  . ICH (intracerebral hemorrhage) ([place] 07/07/2014  . Nontraumatic  thalamic hemorrhage ([place]) 07/06/2014  . Hemiplegia affecting right dominant side ([place]) 07/06/2014  . Cognitive deficit due to recent stroke 07/06/2014  . IVH (intraventricular hemorrhage) ([place]) 07/06/2014  . Essential hypertension 07/06/2014  . Intracranial hemorrhage (H[facility])    Harrel Carina, MS, OTR/L  Harrel Carina 03/28/2015, 8:46 AM  [place] MAIN [facility] SERVICES [address] [place], [place], [address] Phone: [phone]   Fax:  [phone]  Name: Michael Goodwin MRN: [id number] Date of Birth: [birth year]

## 2015-03-29 ENCOUNTER — Ambulatory Visit: Payer: Medicare Other | Admitting: Physical Therapy

## 2015-03-29 ENCOUNTER — Ambulatory Visit: Payer: Medicare Other | Admitting: Occupational Therapy

## 2015-03-29 LAB — PSA: Prostate Specific Ag, Serum: 0.5 ng/mL (ref 0.0–4.0)

## 2015-04-02 ENCOUNTER — Ambulatory Visit: Payer: Medicare Other | Admitting: Physical Medicine & Rehabilitation

## 2015-04-03 ENCOUNTER — Ambulatory Visit: Payer: Medicare Other | Admitting: Urology

## 2015-04-05 ENCOUNTER — Ambulatory Visit: Payer: Medicare Other | Admitting: Urology

## 2015-04-11 ENCOUNTER — Ambulatory Visit: Payer: Medicare Other | Attending: Physical Medicine & Rehabilitation | Admitting: Occupational Therapy

## 2015-04-11 DIAGNOSIS — R5383 Other fatigue: Secondary | ICD-10-CM | POA: Insufficient documentation

## 2015-04-11 DIAGNOSIS — R460 Very low level of personal hygiene: Secondary | ICD-10-CM | POA: Diagnosis present

## 2015-04-11 DIAGNOSIS — R262 Difficulty in walking, not elsewhere classified: Secondary | ICD-10-CM | POA: Diagnosis present

## 2015-04-11 DIAGNOSIS — R279 Unspecified lack of coordination: Secondary | ICD-10-CM | POA: Diagnosis not present

## 2015-04-11 DIAGNOSIS — Z741 Need for assistance with personal care: Secondary | ICD-10-CM

## 2015-04-11 DIAGNOSIS — M6281 Muscle weakness (generalized): Secondary | ICD-10-CM | POA: Diagnosis present

## 2015-04-11 DIAGNOSIS — R531 Weakness: Secondary | ICD-10-CM

## 2015-04-12 NOTE — Therapy (Signed)
[place] MAIN [facility] SERVICES [address] [place], [place], [address] Phone: [phone]   Fax:  [phone]  Occupational Therapy Treatment  Patient Details  Name: Michael Goodwin MRN: [id number] Date of Birth: 1946/01/22 Referring Provider: [name]  Encounter Date: 04/11/2015      OT End of Session - 04/12/15 0835    Visit Number 10   Number of Visits 24   Date for OT Re-Evaluation 05/02/15   Authorization Type G-code 2 of 10 more   OT Start Time 1635   OT Stop Time 1715   OT Time Calculation (min) 40 min   Equipment Utilized During Treatment Stroke test kit.   Activity Tolerance Patient tolerated treatment well   Behavior During Therapy [facility] for tasks assessed/performed      Past Medical History  Diagnosis Date  . Stroke ([place])   . Essential hypertension 07/06/2014  . 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 Hemiplegia affecting right dominant side ([place]) 07/06/2014    Past Surgical History  Procedure Laterality Date  . No past surgeries      There were no vitals filed for this visit.  Visit Diagnosis:  Lack of coordination  Weakness generalized  Self-care deficit for dressing and grooming      Subjective Assessment - 04/12/15 0838    Subjective  Pt. has missed several treatment sessions secondary to having a stomach virus in the family. Pt. reports having pain in the left side of the abdomen.    Pertinent History This patient is a 70 year old male who came to [facility] out patient after suffering a stroke (L thalamic infarct) on May 26th 2016. It resulted in R UE and LE weakness and facial weakness. He also demonstrates sensory loss  Patient spent one month at [facility] before going home. He recieved home health therapy. Daughter reports patient weakness worse since stoping home healt.He had been living alone, but now daughter lives at his daughter's house.,  He needs assist  with dressing, bathing, shaving, and  toileting.      Limitations Walking, ADL, R UE function.   Patient Stated Goals To be able to use his RUE for functional tasks.   Pain Score 9    Pain Location Abdomen   Pain Orientation Left            [facility] OT Assessment - 04/12/15 0001    ADL   ADL comments UE dressing performed. Pt. was able to independently doff jacket. Pt. does require CGA to initiate donning sleeve over RUE and hand. Pt. utilized hemi-dressing techniques. Pt. performed hand to face patterns of movement  for shaving with the right hand. Support at the elbow and wrist, as well as verbal cues were required. Pt. Utilized a mirror for visual cues.                  OT Treatments/Exercises (OP) - 04/12/15 0001    ADLs   ADL Comments UE dressing performed. Pt. was able to independently doff jacket. Pt. does require CGA to initiate donning sleeve over RUE and hand. Pt. utilized hemi-dressing techniques. Pt. performed hand to face patterns of movement  for shaving with the right hand. Support at the elbow and wrist, as well as verbal cues were required. Pt. Utilized a mirror for visual cues.   Fine Motor Coordination   Other Fine Motor Exercises  Pt. performed tasks initiating grasp/release with thumb and second digit for 1" objects.   Neurological  Re-education Exercises   Other Exercises 1  Pt. performed PROM in right UE in reflex inhibiting patterns to reduce tone and prepare UE for functional use. Pt. performed right UE AAROM shoulder flexion, extension, abduction, horizontal abduction and adduction.  Elbow flexion and extension. Verbal and tactile cues were required for proper technique. Pt. performed AAROM seated using the UE Ranger for shoulder protraction, retraction, external rotation, elbow flexion, and extension. Pt. requires support and stabilization at the elbow and forearm. Pt. requires cues for using the proper technique to elicit the desired movement.   Manual Therapy   Manual therapy comments Pt.  performed scapular mobes for elevation, depression, abduction, and adduction seated with visual, verbal, and tactile cues in front of a mirror. Scapular mobes were performed to reduce tone and prepare UE for ROM.                OT Education - 04/12/15 0901    Education provided Yes   Education Details RUE ROM, RUE functioning, Right hand use during ADLs   Person(s) Educated Patient   Methods Explanation   Comprehension Verbalized understanding;Returned demonstration;Tactile cues required             OT Long Term Goals - 03/22/15 1714    OT LONG TERM GOAL #1   Title Patient will demonstrate ability to place one of 9 hole peg test peg to improve fine motor to be abel to fasten pants fasteners.   Baseline unable   Time 12   Period Weeks   Status Not Met   OT LONG TERM GOAL #2   Title Will improve right hand and shoulder function sufficiently to be able to shave self.   Baseline daughter has to shave him.   Time 12   Period Weeks   Status Partially Met   OT LONG TERM GOAL #3   Title Will improve balance and left upper extremity function to be able to make a simple snack.   Baseline Patient does no food prep.   Time 12   Period Weeks   Status New   OT LONG TERM GOAL #4   Title Will improve right hand function or learn to write with left hand so that his writing is 80% legible.    Baseline Hand writing is not legible   Time 12   Period Weeks   Status Partially Met   OT LONG TERM GOAL #5   Title Will improve R shoulder flexion to 90o to be able to move ADL objects on a table    Baseline shoulder flexion limited to 50o of flexion.   Time 12   Period Weeks   Status Partially Met   Long Term Additional Goals   Additional Long Term Goals Yes   OT LONG TERM GOAL #6   Title Pt. will increase right wrist extension by 10 degrees to be able to use grooming care items.   Baseline -5/24   Time 8   Period Weeks   Status New               Plan - 04/12/15 0830     Clinical Impression Statement Pt. is improving with RUE functioning. Pt. continues to present with weakness in RUE and hand, requiring support at elbow and wrist for light grooming and ADL tasks. Pt. has increased right wrist extension.    Pt will benefit from skilled therapeutic intervention in order to improve on the following deficits (Retired) Decreased coordination;Decreased endurance;Decreased balance;Impaired UE functional use;Impaired  tone;Decreased strength   Rehab Potential Good   Clinical Impairments Affecting Rehab Potential RUE impaired tone, ROM, strength, coordination, limiting use during ADL functional tasks.   OT Duration 12 weeks   OT Treatment/Interventions Neuromuscular education;Therapeutic exercise;Energy conservation;Manual Therapy;Therapeutic activities;Therapeutic exercises;Passive range of motion   Consulted and Agree with Plan of Care Patient   Family Member Consulted Daughter        Problem List Patient Active Problem List   Diagnosis Date Noted  . Tobacco use disorder 12/12/2014  . Hemiparesis ([place]) 12/12/2014  . 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 Chronic pain syndrome 12/04/2014  . Alterations of sensations following CVA (cerebrovascular accident) 07/10/2014  . Acute respiratory failure with hypoxia ([place]) 07/07/2014  . Abnormal urinalysis 07/07/2014  . Hypokalemia 07/07/2014  . Dehydration 07/07/2014  . Metabolic encephalopathy 53/29/9242  . Aortic root dilatation (48 mm May 2016) 07/07/2014  . Ascending aorta dilatation (105m May 2016) 07/07/2014  . ICH (intracerebral hemorrhage) ([place] 07/07/2014  . Nontraumatic thalamic hemorrhage ([place]red[place] 07/06/2014  . Hemiplegia affecting right dominant side ([place]Br[place] 07/06/2014  . Cognitive deficit due to recent stroke 07/06/2014  . IVH (intraventricular hemorrhage) ([place] 07/06/2014  . Essential hypertension 07/06/2014  . Intracranial hemorrhage ([place]    EHarrel Carina MS, OTR/L  EHarrel Carina3/03/2015, 9:42 AM  [place]sonvilleMAIN RArk[facility] HospitalSERVICES [address] [place] [place] [address]Phone: [phone]  Fax:  [phone] Name: CMerric YostMRN: [id number] of Birth: 6Apr 02, 1947

## 2015-04-12 NOTE — Therapy (Deleted)
[place] MAIN [facility] SERVICES [address] [place], [place], [address] Phone: [phone]   Fax:  [phone]  Occupational Therapy Treatment  Patient Details  Name: Michael Goodwin MRN: [id number] Date of Birth: 06/07/1945 Referring Provider: [name]  Encounter Date: 04/11/2015      OT End of Session - 04/12/15 0835    Visit Number 10   Number of Visits 24   Date for OT Re-Evaluation 05/02/15   Authorization Type G-code 12 of 20   OT Start Time 1635   OT Stop Time 3009   OT Time Calculation (min) 40 min   Equipment Utilized During Treatment Stroke test kit.   Activity Tolerance Patient tolerated treatment well   Behavior During Therapy [facility] for tasks assessed/performed      Past Medical History  Diagnosis Date  . Stroke ([place])   . Essential hypertension 07/06/2014  . 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 Hemiplegia affecting right dominant side ([place]) 07/06/2014    Past Surgical History  Procedure Laterality Date  . No past surgeries      There were no vitals filed for this visit.  Visit Diagnosis:  Lack of coordination  Weakness generalized  Self-care deficit for dressing and grooming      Subjective Assessment - 04/12/15 0838    Subjective  Pt. has missed several treatment sessions secondary to having a stomach virus in the family. Pt. reports having pain in the left side of the abdomen.    Pertinent History This patient is a 70 year old male who came to [facility] out patient after suffering a stroke (L thalamic infarct) on May 26th 2016. It resulted in R UE and LE weakness and facial weakness. He also demonstrates sensory loss  Patient spent one month at [facility] before going home. He recieved home health therapy. Daughter reports patient weakness worse since stoping home healt.He had been living alone, but now daughter lives at his daughter's house.,  He needs assist  with dressing, bathing, shaving, and  toileting.      Limitations Walking, ADL, R UE function.   Patient Stated Goals To be able to use his RUE for functional tasks.   Pain Score 9    Pain Location Abdomen   Pain Orientation Left            [facility] OT Assessment - 04/12/15 0001    ADL   ADL comments                   OT Treatments/Exercises (OP) - 04/12/15 0001    ADLs   ADL Comments UE dressing was performed. Pt. was able to independently doff jacket. Pt. does require CGA to initiate donning sleeve over RUE and hand. Pt. utilized hemi-dressing techniques. Pt. Worked on hand to face patterns of movement  for shaving with the right hand. Support at the elbow and wrist, as well as verbal cues were required. Pt. utilized a Geologist, engineering for visual cues.   Fine Motor Coordination   Other Fine Motor Exercises  Pt. performed tasks initiating grasp/release with thumb and second digit for 1" objects.   Neurological Re-education Exercises   Other Exercises 1  Pt. performed PROM in right UE in reflex inhibiting patterns to reduce tone and prepare UE for functional use. Pt. performed right UE AAROM shoulder flexion, extension, abduction, horizontal abduction and adduction.  Elbow flexion and extension. Verbal and tactile cues were required for proper technique. Pt. performed AAROM seated using  the UE Ranger for shoulder protraction, retraction, external rotation, elbow flexion, and extension. Pt. requires support and stabilization at the elbow and forearm. Pt. requires cues for using the proper technique to elicit the desired movement. Right wrist AROM has improved to 10 degrees.   Manual Therapy   Manual therapy comments Pt. performed scapular mobes for elevation, depression, abduction, and adduction seated with visual, verbal, and tactile cues in front of a mirror. Scapular mobes were performed to reduce tone and prepare UE for ROM.                OT Education - 04/12/15 0901    Education provided Yes   Education Details  RUE ROM, RUE functioning, Right hand use during ADLs   Person(s) Educated Patient   Methods Explanation   Comprehension Verbalized understanding;Returned demonstration;Tactile cues required             OT Long Term Goals - 03/22/15 1714    OT LONG TERM GOAL #1   Title Patient will demonstrate ability to place one of 9 hole peg test peg to improve fine motor to be abel to fasten pants fasteners.   Baseline unable   Time 12   Period Weeks   Status Not Met   OT LONG TERM GOAL #2   Title Will improve right hand and shoulder function sufficiently to be able to shave self.   Baseline daughter has to shave him.   Time 12   Period Weeks   Status Partially Met   OT LONG TERM GOAL #3   Title Will improve balance and left upper extremity function to be able to make a simple snack.   Baseline Patient does no food prep.   Time 12   Period Weeks   Status New   OT LONG TERM GOAL #4   Title Will improve right hand function or learn to write with left hand so that his writing is 80% legible.    Baseline Hand writing is not legible   Time 12   Period Weeks   Status Partially Met   OT LONG TERM GOAL #5   Title Will improve R shoulder flexion to 90o to be able to move ADL objects on a table    Baseline shoulder flexion limited to 50o of flexion.   Time 12   Period Weeks   Status Partially Met   Long Term Additional Goals   Additional Long Term Goals Yes   OT LONG TERM GOAL #6   Title Pt. will increase right wrist extension by 10 degrees to be able to use grooming care items.   Baseline -5/24   Time 8   Period Weeks   Status New               Plan - 04/12/15 0830    Clinical Impression Statement Pt. is improving with RUE functioning. Pt. continues to present with weakness in RUE and hand, requiring support at elbow and wrist for light grooming and ADL tasks. Pt. has increased right wrist extension.    Pt will benefit from skilled therapeutic intervention in order to improve  on the following deficits (Retired) Decreased coordination;Decreased endurance;Decreased balance;Impaired UE functional use;Impaired tone;Decreased strength   Rehab Potential Good   Clinical Impairments Affecting Rehab Potential RUE impaired tone, ROM, strength, coordination, limiting use during ADL functional tasks.   OT Duration 12 weeks   OT Treatment/Interventions Neuromuscular education;Therapeutic exercise;Energy conservation;Manual Therapy;Therapeutic activities;Therapeutic exercises;Passive range of motion   Consulted and Agree  with Plan of Care Patient   Family Member Consulted Daughter        Problem List Patient Active Problem List   Diagnosis Date Noted  . Tobacco use disorder 12/12/2014  . Hemiparesis ([place]) 12/12/2014  . 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 Chronic pain syndrome 12/04/2014  . Alterations of sensations following CVA (cerebrovascular accident) 07/10/2014  . Acute respiratory failure with hypoxia ([place]) 07/07/2014  . Abnormal urinalysis 07/07/2014  . Hypokalemia 07/07/2014  . Dehydration 07/07/2014  . Metabolic encephalopathy 37/54/2370  . Aortic root dilatation (48 mm May 2016) 07/07/2014  . Ascending aorta dilatation (64m May 2016) 07/07/2014  . ICH (intracerebral hemorrhage) ([place] 07/07/2014  . Nontraumatic thalamic hemorrhage (H[place] 07/06/2014  . Hemiplegia affecting right dominant side ([place] 07/06/2014  . Cognitive deficit due to recent stroke 07/06/2014  . IVH (intraventricular hemorrhage) ([place] 07/06/2014  . Essential hypertension 07/06/2014  . Intracranial hemorrhage ([place]    EHarrel Carina MS, OTR/L  EHarrel Carina3/03/2015, 9:03 AM  [place] [facility] [address] [address]Phone: [phone]  Fax:  [phone] Name: CTamas SuenMRN: [id number] of Birth: 607-18-1947

## 2015-04-12 NOTE — Patient Instructions (Signed)
UE dressing performed. Pt. was able to independently doff jacket. Pt. does require CGA to initiate donning sleeve over RUE and hand. Pt. utilized hemi-dressing techniques. Pt. performed hand to face patterns of movement  for shaving with the right hand. Support at the elbow and wrist, as well as verbal cues were required. Pt. Utilized a mirror for visual cues.  Pt. performed scapular mobes for elevation, depression, abduction, and adduction seated with visual, verbal, and tactile cues in front of a mirror. Scapular mobes were performed to reduce tone and prepare UE for ROM. Pt. performed PROM in right UE in reflex inhibiting patterns to reduce tone and prepare UE for functional use. Pt. performed right UE AAROM shoulder flexion, extension, abduction, horizontal abduction and adduction.  Elbow flexion and extension. Verbal and tactile cues were required for proper technique. Pt. performed AAROM seated using the UE Ranger for shoulder protraction, retraction, external rotation, elbow flexion, and extension. Pt. requires support and stabilization at the elbow and forearm. Pt. requires cues for using the proper technique to elicit the desired movement. Pt. performed tasks initiating grasp/release with thumb and second digit for 1" objects.

## 2015-04-16 ENCOUNTER — Encounter: Payer: Self-pay | Admitting: Physical Therapy

## 2015-04-16 ENCOUNTER — Ambulatory Visit: Payer: Medicare Other | Admitting: Physical Therapy

## 2015-04-16 DIAGNOSIS — R5383 Other fatigue: Secondary | ICD-10-CM

## 2015-04-16 DIAGNOSIS — R279 Unspecified lack of coordination: Secondary | ICD-10-CM | POA: Diagnosis not present

## 2015-04-16 DIAGNOSIS — M6281 Muscle weakness (generalized): Secondary | ICD-10-CM

## 2015-04-16 DIAGNOSIS — R262 Difficulty in walking, not elsewhere classified: Secondary | ICD-10-CM

## 2015-04-16 NOTE — Therapy (Signed)
[place] MAIN [facility] SERVICES [address] [place], [place], [address] Phone: [phone]   Fax:  [phone]  Physical Therapy Treatment/Progress Note 03/01/15 to 04/16/15  Patient Details  Name: Michael Goodwin MRN: [id number] Date of Birth: 01-06-1946 Referring Provider: Alysia Penna  Encounter Date: 04/16/2015      PT End of Session - 04/16/15 1647    Visit Number 12   Number of Visits 25   Date for PT Re-Evaluation 05/14/15   Authorization Type 1   Authorization Time Period 10   PT Start Time 1640   PT Stop Time 1725   PT Time Calculation (min) 45 min   Equipment Utilized During Treatment Gait belt   Activity Tolerance Patient limited by fatigue   Behavior During Therapy [facility] for tasks assessed/performed      Past Medical History  Diagnosis Date  . Stroke ([place])   . Essential hypertension 07/06/2014  . 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 Hemiplegia affecting right dominant side ([place]) 07/06/2014    Past Surgical History  Procedure Laterality Date  . No past surgeries      There were no vitals filed for this visit.  Visit Diagnosis:  Muscle weakness - Plan: PT plan of care cert/re-cert  Difficulty walking - Plan: PT plan of care cert/re-cert  Lethargy - Plan: PT plan of care cert/re-cert      Subjective Assessment - 04/16/15 1646    Subjective Patient has missed multiple visits due to sickness. He reports compliance with HEP; Currently he denies any pain; He denies any new falls;    Pertinent History Pt sufferred a L thalamic infarct May 26,2016 with weakness in RUE/RLE as well as facial weakness. Pt also complains of R sided numbness/tingling. Pt spent a total of 30 days at [facility] and he was discharged directly home. Pt did not discharge to SNF but once home he has been receiving [facility]. His last PT session was was last week and even since ending therapy his daughter reports he seems like he has  gotten weaker. Pt was living in his own home but daughter moved him into her house so that he could receive better care. Pt reports that he has the most difficulty with transfers and long distance ambulation.   Limitations Walking   How long can you walk comfortably? a few hundred feet   Patient Stated Goals To improve independent transfers, ambulation, and bathing.    Currently in Pain? No/denies            [facility] PT Assessment - 04/16/15 0001    6 minute walk test results    Aerobic Endurance Distance Walked 410   Endurance additional comments with hemiwalker; improved from last reassessment on 03/08/15 which was 230 feet;    Standardized Balance Assessment   Five times sit to stand comments  17 sec without HHA; >15 sec indicates increased risk for falls;    10 Meter Walk 0.5 m/s with hemiwalker; slightly more improved since 03/01/15 which was 0.47 m/s with hemiwalker; increased risk for falls;    Berg Balance Test   Sit to Stand Able to stand  independently using hands   Standing Unsupported Able to stand 2 minutes with supervision   Sitting with Back Unsupported but Feet Supported on Floor or Stool Able to sit safely and securely 2 minutes   Stand to Sit Controls descent by using hands   Transfers Able to transfer safely, definite need of hands  Standing Unsupported with Eyes Closed Able to stand 10 seconds safely   Standing Ubsupported with Feet Together Able to place feet together independently and stand for 1 minute with supervision   From Standing, Reach Forward with Outstretched Arm Can reach forward >12 cm safely (5")   From Standing Position, Pick up Object from Floor Unable to pick up shoe, but reaches 2-5 cm (1-2") from shoe and balances independently   From Standing Position, Turn to Look Behind Over each Shoulder Looks behind one side only/other side shows less weight shift   Turn 360 Degrees Needs close supervision or verbal cueing   Standing Unsupported, Alternately  Place Feet on Step/Stool Able to complete >2 steps/needs minimal assist   Standing Unsupported, One Foot in Front Needs help to step but can hold 15 seconds   Standing on One Leg Tries to lift leg/unable to hold 3 seconds but remains standing independently   Total Score 35   Berg comment: <36/56 indicates increased risk for falls; improved from 03/01/15 which was 26/56   Timed Up and Go Test   Normal TUG (seconds) 21   TUG Comments with hemiwalker; slightly improved from 03/01/15 which was 23 sec (>14 sec indicates increased risk for falls);         TREATMENT: Warm up on Nustep LUE and BLE level 3 x3 min (unbilled);  PT instructed patient in 10 meter walk, 5 times sit<>Stand, timed up and go, Berg Balance assessment etc to address progress towards goals. Please see above;  Re-educated patient in HEP including tandem stance and SLS to improve balance control with standing tasks.  Patient required min-moderate verbal/tactile cues for correct exercise technique.                      PT Education - 04/16/15 1647    Education provided Yes   Education Details progress towards goals, HEP   Person(s) Educated Patient   Methods Explanation;Verbal cues   Comprehension Verbalized understanding;Returned demonstration;Verbal cues required             PT Long Term Goals - 04/16/15 1648    PT LONG TERM GOAL #1   Title Pt will be independent and compliant with HEP in order to improve strength, transfers, and balance   Time 4   Period Weeks   Status On-going   PT LONG TERM GOAL #2   Title Pt will demonstrate increase in gait speed by at least 0.14 m/s in order to demonstrate clinically significant improvement in speed to improve function at home.   Baseline 02/01/15: 26.8 = 0.37 m/s   Time 4   Period Weeks   Status Partially Met   PT LONG TERM GOAL #3   Title Pt wil decrease TUG to <14 sec  in order to demonstrate improved LE strength and balance in order to decrease  falls.    Baseline 02/01/15: 31.48 seconds   Time 4   Period Weeks   Status Partially Met   PT LONG TERM GOAL #4   Title patient will increase Berg Balance Assessment score by >6  points in order to demonstrate improved balance and safety with ADLs by 03/29/14   Time 4   Period Weeks   Status Achieved   PT LONG TERM GOAL #5   Title Patient will increase 6 min walk distance by >150 feet to demonstrate improved functional gait ability with home/community ambulation. by 03/29/14   Time 4   Period Weeks   Status  Achieved               Plan - 05-09-2015 1728    Clinical Impression Statement Instructed patient in 10 meter walk, 5 times sit<>stand, Berg Balance assessment etc to address goals. Patient continues to ambulate with hemiwalker with occassionally right foot drag requiring min A for balance to avoid falling. He reports compliance with HEP; Patient's balance did improve with increased Berg Balance assessment score. Patient does continue to test as a high fall risk. He would benefit from additional skilled PT intervention to improve balance/gait safety and reduce fall risk;    Pt will benefit from skilled therapeutic intervention in order to improve on the following deficits Abnormal gait;Decreased balance;Decreased cognition;Decreased knowledge of precautions;Decreased mobility;Decreased range of motion;Decreased safety awareness;Decreased strength;Difficulty walking;Hypomobility;Impaired tone;Impaired UE functional use   Rehab Potential Good   Clinical Impairments Affecting Rehab Potential Positive: motivation, family suppport, current progress, Negative: decreased compliance with HEP, RUE/RLE weakness   PT Frequency 2x / week   PT Duration 8 weeks   PT Treatment/Interventions ADLs/Self Care Home Management;Aquatic Therapy;Biofeedback;Electrical Stimulation;Iontophoresis 44m/ml Dexamethasone;Traction;Ultrasound;Contrast Bath;DME Instruction;Gait training;Stair training;Functional  mobility training;Therapeutic activities;Therapeutic exercise;Balance training;Neuromuscular re-education;Cognitive remediation;Patient/family education;Orthotic Fit/Training;Manual techniques;Energy conservation   PT Next Visit Plan address goals;    PT Home Exercise Plan continue as previously given;    Consulted and Agree with Plan of Care Patient;Family member/caregiver   Family Member Consulted Daughter          G-Codes - [id number]    Functional Assessment Tool Used TUG, 181mait speed, Berg Balance Assessment, 6 min walk clinical judgement   Mobility: Walking and Moving Around Current Status ([id number] least 60 percent but less than 80 percent impaired, limited or restricted   Mobility: Walking and Moving Around Goal Status ([id number] least 40 percent but less than 60 percent impaired, limited or restricted      Problem List Patient Active Problem List   Diagnosis Date Noted  . Tobacco use disorder 12/12/2014  . Hemiparesis (HCBrushy Creek11/02/2014  . Basal ganglia hemorrhage (HCBenton Heights11/02/2014  . Chronic pain syndrome 12/04/2014  . Alterations of sensations following CVA (cerebrovascular accident) 07/10/2014  . Acute respiratory failure with hypoxia (HCGalt05/27/2016  . Abnormal urinalysis 07/07/2014  . Hypokalemia 07/07/2014  . Dehydration 07/07/2014  . Metabolic encephalopathy 0555/21/7471. Aortic root dilatation (48 mm May 2016) 07/07/2014  . Ascending aorta dilatation (503may 2016) 07/07/2014  . ICH (intracerebral hemorrhage) (HCCWestmont5/27/2016  . Nontraumatic thalamic hemorrhage (HCCJanesville5/26/2016  . Hemiplegia affecting right dominant side (HCCRose Hill5/26/2016  . Cognitive deficit due to recent stroke 07/06/2014  . IVH (intraventricular hemorrhage) (HCCLuther5/26/2016  . Essential hypertension 07/06/2014  . Intracranial hemorrhage (HCCBerlin Heights   Anjelo Pullman PT, DPT 3/629-Mar-2017:34 PM  ConDareIN REHSanford Sheldon Medical CenterRVICES [address] [place]one: [phone]:  [phone]: ClyRonell DuffusN: [id number] of Birth: 6/212-19-1947

## 2015-04-17 ENCOUNTER — Ambulatory Visit: Payer: Medicare Other | Admitting: Neurology

## 2015-04-18 ENCOUNTER — Ambulatory Visit: Payer: Medicare Other | Admitting: Occupational Therapy

## 2015-04-18 ENCOUNTER — Encounter: Payer: Self-pay | Admitting: Neurology

## 2015-04-18 ENCOUNTER — Ambulatory Visit: Payer: Medicare Other | Admitting: Physical Therapy

## 2015-04-18 ENCOUNTER — Encounter: Payer: Self-pay | Admitting: Physical Therapy

## 2015-04-18 DIAGNOSIS — R5383 Other fatigue: Secondary | ICD-10-CM

## 2015-04-18 DIAGNOSIS — R262 Difficulty in walking, not elsewhere classified: Secondary | ICD-10-CM

## 2015-04-18 DIAGNOSIS — R279 Unspecified lack of coordination: Secondary | ICD-10-CM

## 2015-04-18 DIAGNOSIS — M6281 Muscle weakness (generalized): Secondary | ICD-10-CM

## 2015-04-18 NOTE — Therapy (Signed)
[place] [facility] MAIN [facility] SERVICES [address] [place], [place], [address] Phone: [phone]   Fax:  [phone]  Occupational Therapy Treatment  Patient Details  Name: Michael Goodwin MRN: [id number] Date of Birth: April 29, 1945 Referring Provider: Loletta Goodwin  Encounter Date: 04/18/2015      OT End of Session - 04/18/15 1445    Visit Number 11   Number of Visits 24   Date for OT Re-Evaluation 05/02/15   Authorization Type G-code 3 of 10 more   OT Start Time 1430   OT Stop Time 1500   OT Time Calculation (min) 30 min      Past Medical History  Diagnosis Date  . Stroke (HCC)   . Essential hypertension 07/06/2014  . 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 Hemiplegia affecting right dominant side (HCC) 07/06/2014    Past Surgical History  Procedure Laterality Date  . No past surgeries      There were no vitals filed for this visit.  Visit Diagnosis:  Lack of coordination      Subjective Assessment - 04/18/15 1717    Subjective  Pt. reports his abdominal pain has resolved   Pertinent History This patient is a 70 year old male who came to [facility] out patient after suffering a stroke (L thalamic infarct) on May 26th 2016. It resulted in R UE and LE weakness and facial weakness. He also demonstrates sensory loss  Patient spent one month at [facility] before going home. He recieved home health therapy. Michael Goodwin reports patient weakness worse since stoping home healt.He had been living alone, but now Michael Goodwin lives at his Michael Goodwin's house.,  He needs assist  with dressing, bathing, shaving, and toileting.      Currently in Pain? Yes   Pain Score 5    Pain Location Leg   Pain Orientation Right   Pain Descriptors / Indicators Aching   Pain Type Chronic pain                      OT Treatments/Exercises (OP) - 04/18/15 1725    Fine Motor Coordination   Other Fine Motor Exercises Pt. Worked on grasping 1 inch objects  and releasing them onto a tabletop surface. Pt. Worked on maintaining the grasp while extending his wrist with cues to keep the forearm stable. Pt. Attempted to initiate translatory movements of the hand to move the object and change position, however pt. had difficulty initiating thumb and 2nd digit working together.                 OT Education - 04/18/15 1444    Education Details Right hand coordination, and grasp/release             OT Long Term Goals - 03/22/15 1714    OT LONG TERM GOAL #1   Title Patient will demonstrate ability to place one of 9 hole peg test peg to improve fine motor to be abel to fasten pants fasteners.   Baseline unable   Time 12   Period Weeks   Status Not Met   OT LONG TERM GOAL #2   Title Will improve right hand and shoulder function sufficiently to be able to shave self.   Baseline Michael Goodwin has to shave him.   Time 12   Period Weeks   Status Partially Met   OT LONG TERM GOAL #3   Title Will improve balance and left upper extremity function to be able to  make a simple snack.   Baseline Patient does no food prep.   Time 12   Period Weeks   Status New   OT LONG TERM GOAL #4   Title Will improve right hand function or learn to write with left hand so that his writing is 80% legible.    Baseline Hand writing is not legible   Time 12   Period Weeks   Status Partially Met   OT LONG TERM GOAL #5   Title Will improve R shoulder flexion to 90o to be able to move ADL objects on a table    Baseline shoulder flexion limited to 50o of flexion.   Time 12   Period Weeks   Status Partially Met   Long Term Additional Goals   Additional Long Term Goals Yes   OT LONG TERM GOAL #6   Title Pt. will increase right wrist extension by 10 degrees to be able to use grooming care items.   Baseline -5/24   Time 8   Period Weeks   Status New               Plan - 04/18/15 1456    Clinical Impression Statement Pt.  is improving with RUE hand  function and coordination skills. Pt. is able to grasp one inch objects, and release to place them on a surface. Pt. has difficulty initiating and perfroming translatory moveemnts of the hand.    Pt will benefit from skilled therapeutic intervention in order to improve on the following deficits (Retired) Decreased coordination;Decreased endurance;Decreased balance;Impaired UE functional use;Impaired tone;Decreased strength   Rehab Potential Good   Clinical Impairments Affecting Rehab Potential RUE impaired tone, ROM, strength, coordination, limiting use during ADL functional tasks.   OT Frequency 2x / week   OT Duration 12 weeks   OT Treatment/Interventions Neuromuscular education;Therapeutic exercise;Energy conservation;Manual Therapy;Therapeutic activities;Therapeutic exercises;Passive range of motion   Consulted and Agree with Plan of Care Patient   Family Member Consulted Michael Goodwin        Problem List Patient Active Problem List   Diagnosis Date Noted  . Tobacco use disorder 12/12/2014  . Hemiparesis ([place]) 12/12/2014  . 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 Chronic pain syndrome 12/04/2014  . Alterations of sensations following CVA (cerebrovascular accident) 07/10/2014  . Acute respiratory failure with hypoxia ([place]) 07/07/2014  . Abnormal urinalysis 07/07/2014  . Hypokalemia 07/07/2014  . Dehydration 07/07/2014  . Metabolic encephalopathy 10/65/3990  . Aortic root dilatation (48 mm May 2016) 07/07/2014  . Ascending aorta dilatation (8m May 2016) 07/07/2014  . ICH (intracerebral hemorrhage) ([place] 07/07/2014  . Nontraumatic thalamic hemorrhage ([place] 07/06/2014  . Hemiplegia affecting right dominant side ([place] 07/06/2014  . Cognitive deficit due to recent stroke 07/06/2014  . IVH (intraventricular hemorrhage) ([place] 07/06/2014  . Essential hypertension 07/06/2014  . Intracranial hemorrhage (HCC)    EHarrel Carina MS, OTR/L    EHarrel Carina3/09/2015, 5:26  PM  [facility] Goodwin [facility] Services[facility] [address] [address]Phone: [phone]  Fax:  [phone] Name: CBashir MarchettiMRN: [id number] of Birth: 612-13-1947

## 2015-04-18 NOTE — Therapy (Signed)
[place] MAIN [facility] SERVICES [address] [place], [place], [address] Phone: [phone]   Fax:  [phone]  Physical Therapy Treatment  Patient Details  Name: Michael Goodwin MRN: [id number] Date of Birth: 04-04-1945 Referring Provider: Alysia Penna  Encounter Date: 04/18/2015      PT End of Session - 04/18/15 1406    Visit Number 13   Number of Visits 25   Date for PT Re-Evaluation 05/14/15   Authorization Type 2   Authorization Time Period 10   PT Start Time 1400   PT Stop Time 1430   PT Time Calculation (min) 30 min   Equipment Utilized During Treatment Gait belt   Activity Tolerance Patient limited by fatigue   Behavior During Therapy [facility] for tasks assessed/performed      Past Medical History  Diagnosis Date  . Stroke ([place])   . Essential hypertension 07/06/2014  . 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 Hemiplegia affecting right dominant side ([place]) 07/06/2014    Past Surgical History  Procedure Laterality Date  . No past surgeries      There were no vitals filed for this visit.  Visit Diagnosis:  Difficulty walking  Muscle weakness  Lethargy      Subjective Assessment - 04/18/15 1405    Subjective Patient reports increased soreness in leg; He denies any new falls. He reports being sick yesterday but is feeling better today;    Pertinent History Pt sufferred a L thalamic infarct May 26,2016 with weakness in RUE/RLE as well as facial weakness. Pt also complains of R sided numbness/tingling. Pt spent a total of 30 days at [facility] and he was discharged directly home. Pt did not discharge to SNF but once home he has been receiving [facility]. His last PT session was was last week and even since ending therapy his daughter reports he seems like he has gotten weaker. Pt was living in his own home but daughter moved him into her house so that he could receive better care. Pt reports that he has the most  difficulty with transfers and long distance ambulation.   Limitations Walking   How long can you walk comfortably? a few hundred feet   Patient Stated Goals To improve independent transfers, ambulation, and bathing.    Currently in Pain? Yes   Pain Score 6    Pain Location Leg   Pain Orientation Right   Pain Descriptors / Indicators Aching;Sore   Pain Type Chronic pain      TREATMENT: Warm up on Nustep BLE only level 2 x 3 min (unbilled);  Green tband hip flexion with tactile cues to increase ROM for better strengthening x10 bilaterally; Side stepping in parallel bars with green tband for resistance 10 feet x2 laps each direction with mod VCs to increase ROM for better strengthening;  Alternate toe taps on 4 inch step with 1-0 rail assist x15 bilaterally with moderate verbal and visual cues to increase hip flexion for better right foot clearance and to improve right knee extension in stance for better balance;  Leg press, BLE 90# x10 with min Vcs to slow down eccentric return for better quad control; RLE only leg press: 45# 2x12 with mod VCS to reduce right knee valgus and improve positioning for better strengthening;   Patient ambulated with [facility] in LUE with step through 3 point gait pattern x25 feet x2 reps, 80 feet x1; Patient requires min VCs to increase base of support for better  gait safety;                             PT Education - 04/18/15 1406    Education provided Yes   Education Details gait safety, LE strengthening;    Person(s) Educated Patient   Methods Explanation;Verbal cues   Comprehension Verbalized understanding;Returned demonstration;Verbal cues required             PT Long Term Goals - 04/16/15 1648    PT LONG TERM GOAL #1   Title Pt will be independent and compliant with HEP in order to improve strength, transfers, and balance   Time 4   Period Weeks   Status On-going   PT LONG TERM GOAL #2   Title Pt will demonstrate  increase in gait speed by at least 0.14 m/s in order to demonstrate clinically significant improvement in speed to improve function at home.   Baseline 02/01/15: 26.8 = 0.37 m/s   Time 4   Period Weeks   Status Partially Met   PT LONG TERM GOAL #3   Title Pt wil decrease TUG to <14 sec  in order to demonstrate improved LE strength and balance in order to decrease falls.    Baseline 02/01/15: 31.48 seconds   Time 4   Period Weeks   Status Partially Met   PT LONG TERM GOAL #4   Title patient will increase Berg Balance Assessment score by >6  points in order to demonstrate improved balance and safety with ADLs by 03/29/14   Time 4   Period Weeks   Status Achieved   PT LONG TERM GOAL #5   Title Patient will increase 6 min walk distance by >150 feet to demonstrate improved functional gait ability with home/community ambulation. by 03/29/14   Time 4   Period Weeks   Status Achieved               Plan - 04/18/15 1424    Clinical Impression Statement Patient instructed in BLE strengthening working on hip and quad strengthening. Patient has difficulty maintaining RLE knee extension in stance requiring increased cues for better balance. Patient fatigues quickly with advanced exercise. He would benefit from additional skilled PT intervention to improve balance/gait safety and LE strength;    Pt will benefit from skilled therapeutic intervention in order to improve on the following deficits Abnormal gait;Decreased balance;Decreased cognition;Decreased knowledge of precautions;Decreased mobility;Decreased range of motion;Decreased safety awareness;Decreased strength;Difficulty walking;Hypomobility;Impaired tone;Impaired UE functional use   Rehab Potential Good   Clinical Impairments Affecting Rehab Potential Positive: motivation, family suppport, current progress, Negative: decreased compliance with HEP, RUE/RLE weakness   PT Frequency 2x / week   PT Duration 8 weeks   PT  Treatment/Interventions ADLs/Self Care Home Management;Aquatic Therapy;Biofeedback;Electrical Stimulation;Iontophoresis 24m/ml Dexamethasone;Traction;Ultrasound;Contrast Bath;DME Instruction;Gait training;Stair training;Functional mobility training;Therapeutic activities;Therapeutic exercise;Balance training;Neuromuscular re-education;Cognitive remediation;Patient/family education;Orthotic Fit/Training;Manual techniques;Energy conservation   PT Next Visit Plan work on LE strengthening, gait training, and balance;    PT Home Exercise Plan continue as previously given;    Consulted and Agree with Plan of Care Patient;Family member/caregiver   Family Member Consulted Daughter        Problem List Patient Active Problem List   Diagnosis Date Noted  . Tobacco use disorder 12/12/2014  . Hemiparesis ([place] 12/12/2014  . Basal ganglia hemorrhage (HHighland Lakes 12/12/2014  . Chronic pain syndrome 12/04/2014  . Alterations of sensations following CVA (cerebrovascular accident) 07/10/2014  . Acute respiratory failure with hypoxia (  [place]) 07/07/2014  . Abnormal urinalysis 07/07/2014  . Hypokalemia 07/07/2014  . Dehydration 07/07/2014  . Metabolic encephalopathy 99/71/8209  . Aortic root dilatation (48 mm May 2016) 07/07/2014  . Ascending aorta dilatation (62m May 2016) 07/07/2014  . ICH (intracerebral hemorrhage) ([place]Ge[place] 07/07/2014  . Nontraumatic thalamic hemorrhage (HAl[place] 07/06/2014  . Hemiplegia affecting right dominant side (HL[place] 07/06/2014  . Cognitive deficit due to recent stroke 07/06/2014  . IVH (intraventricular hemorrhage) (HEbro 07/06/2014  . Essential hypertension 07/06/2014  . Intracranial hemorrhage ([place]Cheboygan     Michael Goodwin PT, DPT 04/18/2015, 5:05 PM  [place] [facility] [address] [address]Phone: [phone]  Fax:  [phone] Name: CKarin GriffithMRN: [id number] of Birth: [birth year]

## 2015-04-23 ENCOUNTER — Ambulatory Visit: Payer: Medicare Other | Admitting: Occupational Therapy

## 2015-04-25 ENCOUNTER — Ambulatory Visit: Payer: Medicare Other | Admitting: Urology

## 2015-04-25 ENCOUNTER — Encounter: Payer: Self-pay | Admitting: Physical Therapy

## 2015-04-25 ENCOUNTER — Ambulatory Visit: Payer: Medicare Other | Admitting: Occupational Therapy

## 2015-04-25 ENCOUNTER — Encounter: Payer: Self-pay | Admitting: Urology

## 2015-04-25 ENCOUNTER — Ambulatory Visit: Payer: Medicare Other | Admitting: Physical Therapy

## 2015-04-25 DIAGNOSIS — R262 Difficulty in walking, not elsewhere classified: Secondary | ICD-10-CM

## 2015-04-25 DIAGNOSIS — R279 Unspecified lack of coordination: Secondary | ICD-10-CM

## 2015-04-25 DIAGNOSIS — R531 Weakness: Secondary | ICD-10-CM

## 2015-04-25 DIAGNOSIS — M6281 Muscle weakness (generalized): Secondary | ICD-10-CM

## 2015-04-25 DIAGNOSIS — R5383 Other fatigue: Secondary | ICD-10-CM

## 2015-04-25 NOTE — Therapy (Signed)
[place] MAIN [facility] SERVICES [address] [place], [place], [address] Phone: [phone]   Fax:  [phone]  Occupational Therapy Treatment  Patient Details  Name: Michael Goodwin MRN: [id number] Date of Birth: 1945-07-16 Referring Provider: [name]  Encounter Date: 04/25/2015      OT End of Session - 04/25/15 1630    Visit Number 12   Number of Visits 24   Date for OT Re-Evaluation 05/02/15   Authorization Type G-code 4 of 10 more   OT Start Time 1515   OT Stop Time 1600   OT Time Calculation (min) 45 min   Activity Tolerance Patient tolerated treatment well   Behavior During Therapy [facility] for tasks assessed/performed      Past Medical History  Diagnosis Date  . Stroke ([place])   . Essential hypertension 07/06/2014  . 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 Hemiplegia affecting right dominant side ([place]) 07/06/2014    Past Surgical History  Procedure Laterality Date  . No past surgeries      There were no vitals filed for this visit.  Visit Diagnosis:  Weakness generalized      Subjective Assessment - 04/25/15 1626    Subjective  Pt. reports he is trying to work with his hand at home.   Pertinent History This patient is a 70 year old male who came to [facility] out patient after suffering a stroke (L thalamic infarct) on May 26th 2016. It resulted in R UE and LE weakness and facial weakness. He also demonstrates sensory loss  Patient spent one month at [facility] before going home. He recieved home health therapy. Daughter reports patient weakness worse since stoping home healt.He had been living alone, but now daughter lives at his daughter's house.,  He needs assist  with dressing, bathing, shaving, and toileting.      Limitations Walking, ADL, R UE function.   Patient Stated Goals To be able to use his RUE for functional tasks.   Currently in Pain? No/denies   Pain Score 0-No pain                      OT Treatments/Exercises (OP) - 04/25/15 1654    Fine Motor Coordination   Other Fine Motor Exercises Pt. performed grasping, and maintaining grasp on 1" objects while incorporating wrist extension, and forearm supination in preparation for grasping ADL and IADL items.   Neurological Re-education Exercises   Other Exercises 1 Pt. performed PROM in right UE in reflex inhibiting patterns to reduce tone and prepare UE for functional use. Pt. performed right UE AAROM in supine scapular elevation, depression, protraction and retraction, shoulder flexion, extension, abduction, horizontal abduction and adduction. Elbow flexion and extension. Pt. also worked on 1.5# dowel with elbows extended. Verbal and tactile cues were required for proper technique, and to keep elbows extended. Pt. worked on wrist extension, and forearm supination.    Manual Therapy   Manual therapy comments Pt. performed scapular mobilizations for scapular elevation, depression, abduction/adduction in sitting with a mirror for visual cues. Scapular mobes were performed to help normalize tone, and prepare UE for ROM and functional use.                     OT Long Term Goals - 03/22/15 1714    OT LONG TERM GOAL #1   Title Patient will demonstrate ability to place one of 9 hole peg test peg to improve fine motor  to be abel to fasten pants fasteners.   Baseline unable   Time 12   Period Weeks   Status Not Met   OT LONG TERM GOAL #2   Title Will improve right hand and shoulder function sufficiently to be able to shave self.   Baseline daughter has to shave him.   Time 12   Period Weeks   Status Partially Met   OT LONG TERM GOAL #3   Title Will improve balance and left upper extremity function to be able to make a simple snack.   Baseline Patient does no food prep.   Time 12   Period Weeks   Status New   OT LONG TERM GOAL #4   Title Will improve right hand function or learn to write with left hand so that his  writing is 80% legible.    Baseline Hand writing is not legible   Time 12   Period Weeks   Status Partially Met   OT LONG TERM GOAL #5   Title Will improve R shoulder flexion to 90o to be able to move ADL objects on a table    Baseline shoulder flexion limited to 50o of flexion.   Time 12   Period Weeks   Status Partially Met   Long Term Additional Goals   Additional Long Term Goals Yes   OT LONG TERM GOAL #6   Title Pt. will increase right wrist extension by 10 degrees to be able to use grooming care items.   Baseline -5/24   Time 8   Period Weeks   Status New               Plan - 04/25/15 1631    Clinical Impression Statement Pt. continues to present with increased tone through the RUE and elbow which hinders his ability to perfrom ROM and functional use during ADL and IADL tasks. Pt. is improving ROM in  the RUE. Wrist  ROM has improved  to 22 degrees, and pt. is grasping 1" objects with his  thumb and 2nd digit. Pt. continues to require work on adding additional hand movements while maintaining grasp on objects.    Pt will benefit from skilled therapeutic intervention in order to improve on the following deficits (Retired) Decreased coordination;Decreased endurance;Decreased balance;Impaired UE functional use;Impaired tone;Decreased strength   Rehab Potential Good   Clinical Impairments Affecting Rehab Potential RUE impaired tone, ROM, strength, coordination, limiting use during ADL functional tasks.   OT Frequency 2x / week   OT Duration 12 weeks   OT Treatment/Interventions Neuromuscular education;Therapeutic exercise;Energy conservation;Manual Therapy;Therapeutic activities;Therapeutic exercises;Passive range of motion   Consulted and Agree with Plan of Care Patient        Problem List Patient Active Problem List   Diagnosis Date Noted  . Tobacco use disorder 12/12/2014  . Hemiparesis ([place]) 12/12/2014  . 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 Chronic pain  syndrome 12/04/2014  . Alterations of sensations following CVA (cerebrovascular accident) 07/10/2014  . Acute respiratory failure with hypoxia ([place]) 07/07/2014  . Abnormal urinalysis 07/07/2014  . Hypokalemia 07/07/2014  . Dehydration 07/07/2014  . Metabolic encephalopathy 98/33/8250  . Aortic root dilatation (48 mm May 2016) 07/07/2014  . Ascending aorta dilatation (75m May 2016) 07/07/2014  . ICH (intracerebral hemorrhage) (HGainesville 07/07/2014  . Nontraumatic thalamic hemorrhage (HR[place] 07/06/2014  . Hemiplegia affecting right dominant side ([place]Marriott[place]Slat[place] 07/06/2014  . Cognitive deficit due to recent stroke 07/06/2014  . IVH (intraventricular hemorrhage) (HGreen [place] 07/06/2014  . Essential  hypertension 07/06/2014  . Intracranial hemorrhage (HCC)    Harrel Carina, MS, OTR/L  Harrel Carina 04/25/2015, 4:58 PM  [place] MAIN [facility] SERVICES [address] [place], [place], [address] Phone: [phone]   Fax:  [phone]  Name: Cline Draheim MRN: [id number] Date of Birth: [birth year]

## 2015-04-26 NOTE — Therapy (Signed)
[place] MAIN [facility] SERVICES [address] [place], [place], [address] Phone: [phone]   Fax:  [phone]  Physical Therapy Treatment  Patient Details  Name: Michael Goodwin MRN: [id number] Date of Birth: 1945/12/30 Referring Provider: Alysia Penna  Encounter Date: 04/25/2015      PT End of Session - 04/26/15 0942    Visit Number 14   Number of Visits 25   Date for PT Re-Evaluation 05/14/15   Authorization Type 3   Authorization Time Period 10   PT Start Time 1450   PT Stop Time 1515   PT Time Calculation (min) 25 min   Equipment Utilized During Treatment Gait belt   Activity Tolerance Patient tolerated treatment well   Behavior During Therapy [facility] for tasks assessed/performed      Past Medical History  Diagnosis Date  . Stroke ([place])   . Essential hypertension 07/06/2014  . 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 Hemiplegia affecting right dominant side ([place]) 07/06/2014    Past Surgical History  Procedure Laterality Date  . No past surgeries      There were no vitals filed for this visit.  Visit Diagnosis:  Lack of coordination  Difficulty walking  Muscle weakness  Lethargy      Subjective Assessment - 04/25/15 1505    Subjective Patient was late to PT session; He denies any pain; Reports still having trouble with his balance;    Pertinent History Pt sufferred a L thalamic infarct May 26,2016 with weakness in RUE/RLE as well as facial weakness. Pt also complains of R sided numbness/tingling. Pt spent a total of 30 days at [facility] and he was discharged directly home. Pt did not discharge to SNF but once home he has been receiving [facility]. His last PT session was was last week and even since ending therapy his daughter reports he seems like he has gotten weaker. Pt was living in his own home but daughter moved him into her house so that he could receive better care. Pt reports that he has the most  difficulty with transfers and long distance ambulation.   Limitations Walking   How long can you walk comfortably? a few hundred feet   Patient Stated Goals To improve independent transfers, ambulation, and bathing.    Currently in Pain? No/denies      TREATMENT:  Gait on treadmill 1.0 mph with 2 HHA x3 min with one standing rest break and mod VCs to increase RLE hip flexion and knee flexion for better foot clearance in swing; Also required mod VCS to increase terminal knee extension on RLE for terminal swing to improve heel strike;  Leg press, BLE 90# x15 with min Vcs to slow down eccentric return for better quad control; RLE only leg press: 45# 2x12 with mod VCS and tactile cues to reduce right knee valgus and improve positioning for better strengthening;   Forward high knee marches in parallel bars x2 laps;  Side step over 1/2 bolster x5 each direction with rail assist and mod VCS to increase step length for better foot clearance; Forward/backward step over 1/2 bolster x5 each direction with rail assist and mod VCs to increase step length;  Patient required min-moderate verbal/tactile cues for correct exercise technique.                            PT Education - 04/26/15 0941    Education provided Yes  Education Details gait safety, strengthening   Person(s) Educated Patient   Methods Explanation;Verbal cues   Comprehension Verbalized understanding;Returned demonstration;Verbal cues required             PT Long Term Goals - 04/16/15 1648    PT LONG TERM GOAL #1   Title Pt will be independent and compliant with HEP in order to improve strength, transfers, and balance   Time 4   Period Weeks   Status On-going   PT LONG TERM GOAL #2   Title Pt will demonstrate increase in gait speed by at least 0.14 m/s in order to demonstrate clinically significant improvement in speed to improve function at home.   Baseline 02/01/15: 26.8 = 0.37 m/s   Time 4    Period Weeks   Status Partially Met   PT LONG TERM GOAL #3   Title Pt wil decrease TUG to <14 sec  in order to demonstrate improved LE strength and balance in order to decrease falls.    Baseline 02/01/15: 31.48 seconds   Time 4   Period Weeks   Status Partially Met   PT LONG TERM GOAL #4   Title patient will increase Berg Balance Assessment score by >6  points in order to demonstrate improved balance and safety with ADLs by 03/29/14   Time 4   Period Weeks   Status Achieved   PT LONG TERM GOAL #5   Title Patient will increase 6 min walk distance by >150 feet to demonstrate improved functional gait ability with home/community ambulation. by 03/29/14   Time 4   Period Weeks   Status Achieved               Plan - 04/26/15 4585    Clinical Impression Statement Patient was late to  PT session; Patient instructed in gait safety on treadmill and working on stepping over bolster for better balance challenge. Patient continues to exhibit increased right foot drag despite cues for increased hip/knee flexion during swing. He would benefit from additional skilled PT intervention to improve balance/gait safety and strength for increased mobility;    Pt will benefit from skilled therapeutic intervention in order to improve on the following deficits Abnormal gait;Decreased balance;Decreased cognition;Decreased knowledge of precautions;Decreased mobility;Decreased range of motion;Decreased safety awareness;Decreased strength;Difficulty walking;Hypomobility;Impaired tone;Impaired UE functional use   Rehab Potential Good   Clinical Impairments Affecting Rehab Potential Positive: motivation, family suppport, current progress, Negative: decreased compliance with HEP, RUE/RLE weakness   PT Frequency 2x / week   PT Duration 8 weeks   PT Treatment/Interventions ADLs/Self Care Home Management;Aquatic Therapy;Biofeedback;Electrical Stimulation;Iontophoresis 42m/ml Dexamethasone;Traction;Ultrasound;Contrast  Bath;DME Instruction;Gait training;Stair training;Functional mobility training;Therapeutic activities;Therapeutic exercise;Balance training;Neuromuscular re-education;Cognitive remediation;Patient/family education;Orthotic Fit/Training;Manual techniques;Energy conservation   PT Next Visit Plan work on LE strengthening, gait training, and balance;    PT Home Exercise Plan continue as previously given;    Consulted and Agree with Plan of Care Patient;Family member/caregiver   Family Member Consulted Daughter        Problem List Patient Active Problem List   Diagnosis Date Noted  . Tobacco use disorder 12/12/2014  . Hemiparesis ([place] 12/12/2014  . Basal ganglia hemorrhage ([place]air [place] 12/12/2014  . Chronic pain syndrome 12/04/2014  . Alterations of sensations following CVA (cerebrovascular accident) 07/10/2014  . Acute respiratory failure with hypoxia ([place] 07/07/2014  . Abnormal urinalysis 07/07/2014  . Hypokalemia 07/07/2014  . Dehydration 07/07/2014  . Metabolic encephalopathy 092/92/4462 . Aortic root dilatation (48 mm May 2016) 07/07/2014  . Ascending aorta dilatation (  87m May 2016) 07/07/2014  . ICH (intracerebral hemorrhage) (HCarefree 07/07/2014  . Nontraumatic thalamic hemorrhage ([place] 07/06/2014  . Hemiplegia affecting right dominant side ([place] 07/06/2014  . Cognitive deficit due to recent stroke 07/06/2014  . IVH (intraventricular hemorrhage) ([place] 07/06/2014  . Essential hypertension 07/06/2014  . Intracranial hemorrhage ([place]       Paymon Rosensteel PT, DPT 04/26/2015, 9:44 AM  [place] [facility] [address] [address]Phone: [phone]  Fax:  [phone] Name: CDakarai McglocklinMRN: [id number] of Birth: 624-Nov-1947

## 2015-04-30 ENCOUNTER — Emergency Department
Admission: EM | Admit: 2015-04-30 | Discharge: 2015-04-30 | Disposition: A | Discharge status: Home / Self Care | Payer: Medicare Other | Attending: Emergency Medicine | Admitting: Emergency Medicine

## 2015-04-30 ENCOUNTER — Encounter: Payer: Self-pay | Admitting: Emergency Medicine

## 2015-04-30 DIAGNOSIS — E876 Hypokalemia: Secondary | ICD-10-CM | POA: Diagnosis not present

## 2015-04-30 DIAGNOSIS — G8929 Other chronic pain: Secondary | ICD-10-CM | POA: Insufficient documentation

## 2015-04-30 DIAGNOSIS — I615 Nontraumatic intracerebral hemorrhage, intraventricular: Secondary | ICD-10-CM | POA: Insufficient documentation

## 2015-04-30 DIAGNOSIS — Z7982 Long term (current) use of aspirin: Secondary | ICD-10-CM | POA: Diagnosis not present

## 2015-04-30 DIAGNOSIS — J9601 Acute respiratory failure with hypoxia: Secondary | ICD-10-CM | POA: Insufficient documentation

## 2015-04-30 DIAGNOSIS — F17210 Nicotine dependence, cigarettes, uncomplicated: Secondary | ICD-10-CM | POA: Diagnosis not present

## 2015-04-30 DIAGNOSIS — G8191 Hemiplegia, unspecified affecting right dominant side: Secondary | ICD-10-CM | POA: Diagnosis not present

## 2015-04-30 DIAGNOSIS — R112 Nausea with vomiting, unspecified: Secondary | ICD-10-CM | POA: Insufficient documentation

## 2015-04-30 DIAGNOSIS — Z79899 Other long term (current) drug therapy: Secondary | ICD-10-CM | POA: Diagnosis not present

## 2015-04-30 DIAGNOSIS — I10 Essential (primary) hypertension: Secondary | ICD-10-CM | POA: Diagnosis not present

## 2015-04-30 DIAGNOSIS — I639 Cerebral infarction, unspecified: Secondary | ICD-10-CM | POA: Insufficient documentation

## 2015-04-30 LAB — URINALYSIS COMPLETE WITH MICROSCOPIC (ARMC ONLY)
Glucose, UA: NEGATIVE mg/dL
Hgb urine dipstick: NEGATIVE
Leukocytes, UA: NEGATIVE
Nitrite: NEGATIVE
PH: 5 (ref 5.0–8.0)
PROTEIN: 100 mg/dL — AB
Specific Gravity, Urine: 1.03 (ref 1.005–1.030)

## 2015-04-30 LAB — TROPONIN I

## 2015-04-30 LAB — CBC
HCT: 37.2 % — ABNORMAL LOW (ref 40.0–52.0)
HEMOGLOBIN: 12.4 g/dL — AB (ref 13.0–18.0)
MCH: 29 pg (ref 26.0–34.0)
MCHC: 33.2 g/dL (ref 32.0–36.0)
MCV: 87.5 fL (ref 80.0–100.0)
Platelets: 271 10*3/uL (ref 150–440)
RBC: 4.26 MIL/uL — ABNORMAL LOW (ref 4.40–5.90)
RDW: 15.1 % — ABNORMAL HIGH (ref 11.5–14.5)
WBC: 8.4 10*3/uL (ref 3.8–10.6)

## 2015-04-30 LAB — BASIC METABOLIC PANEL
ANION GAP: 9 (ref 5–15)
BUN: 9 mg/dL (ref 6–20)
CALCIUM: 9.8 mg/dL (ref 8.9–10.3)
CO2: 27 mmol/L (ref 22–32)
Chloride: 102 mmol/L (ref 101–111)
Creatinine, Ser: 0.91 mg/dL (ref 0.61–1.24)
GFR calc Af Amer: >60 mL/min (ref >60)
GFR calc non Af Amer: >60 mL/min (ref >60)
GLUCOSE: 112 mg/dL — AB (ref 65–99)
Potassium: 3.8 mmol/L (ref 3.5–5.1)
Sodium: 138 mmol/L (ref 135–145)

## 2015-04-30 MED ORDER — ONDANSETRON 4 MG PO TBDP
4.0000 mg | ORAL_TABLET | Freq: Once | ORAL | Status: AC
  Administered 2015-04-30: 4 mg via ORAL
  Filled 2015-04-30: qty 1

## 2015-04-30 MED ORDER — SODIUM CHLORIDE 0.9 % IV BOLUS (SEPSIS)
1000.0000 mL | Freq: Once | INTRAVENOUS | Status: AC
  Administered 2015-04-30: 1000 mL via INTRAVENOUS

## 2015-04-30 MED ORDER — ONDANSETRON 4 MG PO TBDP: 4.0000 mg | ORAL_TABLET | Freq: Once | ORAL | Status: DC

## 2015-04-30 NOTE — ED Provider Notes (Signed)
[facility] Emergency Department Provider Note  ____________________________________________  Time seen: Approximately 7:15 PM  I have reviewed the triage vital signs and the nursing notes.   HISTORY  Chief Complaint Emesis and Weakness    HPI Michael Goodwin is a 70 y.o. male with a history of HTN, CVA with resulting hemiplegia on the right, presenting with nausea and vomiting. The patient is unable to give a clear history, and his son was not with him this morning, but the report is that he had several episodes of nausea and vomiting this morning. He did not have any associated diarrhea, fever, cough or cold symptoms. No known sick contacts, and he does not remember what he had for dinner. The patient has not had any vomiting for the last several hours. He has intermittently complained of fleeting diffuse abdominal pain, but does not have any pain at this time.   Past Medical History  Diagnosis Date  . Stroke (HCC)   . Essential hypertension 07/06/2014  . 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 Hemiplegia affecting right dominant side (HCC) 07/06/2014    Patient Active Problem List   Diagnosis Date Noted  . Tobacco use disorder 12/12/2014  . Hemiparesis (HCC) 12/12/2014  . 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 Chronic pain syndrome 12/04/2014  . Alterations of sensations following CVA (cerebrovascular accident) 07/10/2014  . Acute respiratory failure with hypoxia (HCC) 07/07/2014  . Abnormal urinalysis 07/07/2014  . Hypokalemia 07/07/2014  . Dehydration 07/07/2014  . Metabolic encephalopathy 07/07/2014  . Aortic root dilatation (48 mm May 2016) 07/07/2014  . 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 ICH (intracerebral hemorrhage) (HCC) 07/07/2014  . 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 Hemiplegia affecting right dominant side (HCC) 07/06/2014  . Cognitive deficit due to recent stroke 07/06/2014  . IVH  (intraventricular hemorrhage) (HCC) 07/06/2014  . Essential hypertension 07/06/2014  . Intracranial hemorrhage [facility])     Past Surgical History  Procedure Laterality Date  . No past surgeries      Current Outpatient Rx  Name  Route  Sig  Dispense  Refill  . amLODipine (NORVASC) 5 MG tablet   Oral   Take 1 tablet (5 mg total) by mouth daily.   30 tablet   1   . aspirin EC 81 MG tablet   Oral   Take by mouth.         . benzonatate (TESSALON) 100 MG capsule   Oral   Take 200 mg by mouth 3 (three) times daily as needed for cough.         . gabapentin (NEURONTIN) 100 MG capsule   Oral   Take 100 mg by mouth 3 (three) times daily.         Marland Kitchen lisinopril (PRINIVIL,ZESTRIL) 10 MG tablet   Oral   Take 1 tablet (10 mg total) by mouth daily.   30 tablet   1   . loratadine (CLARITIN) 10 MG tablet   Oral   Take 10 mg by mouth daily.         . methylphenidate (RITALIN) 5 MG tablet   Oral   Take 1 tablet (5 mg total) by mouth 2 (two) times daily with breakfast and lunch.   60 tablet   0   . mirtazapine (REMERON) 15 MG tablet   Oral   Take 1 tablet (15 mg total) by mouth at bedtime.   30 tablet   1   . nitrofurantoin, macrocrystal-monohydrate, (MACROBID)  100 MG capsule   Oral   Take 100 mg by mouth 2 (two) times daily. Reported on 01/31/2015         . ondansetron (ZOFRAN-ODT) 4 MG disintegrating tablet   Oral   Take 1 tablet (4 mg total) by mouth once.   12 tablet   0   . tamsulosin (FLOMAX) 0.4 MG CAPS capsule   Oral   Take 1 capsule (0.4 mg total) by mouth daily.   30 capsule   6     Allergies Review of patient's allergies indicates no known allergies.  Family History  Problem Relation Age of Onset  . Diabetes    . Stroke Brother     Social History Social History  Substance Use Topics  . Smoking status: Current Every Day Smoker -- 0.50 packs/day for 55 years    Types: Cigarettes  . Smokeless tobacco: None  . Alcohol Use: No    Review of  Systems Constitutional: No fever/chills. No lightheadedness or syncope. Eyes: No visual changes. ENT: No sore throat. No congestion or rhinorrhea. Cardiovascular: Denies chest pain, palpitations. Respiratory: Denies shortness of breath.  No cough. Gastrointestinal: Positive nonfocal abdominal pain.  Nausea nausea, positive vomiting.  No diarrhea.  No constipation. Genitourinary: Negative for dysuria. Musculoskeletal: Negative for back pain. Skin: Negative for rash. Neurological: Negative for headache. Positive for residual right sided weakness after CVA.  10-point ROS otherwise negative.  ____________________________________________   PHYSICAL EXAM:  VITAL SIGNS: ED Triage Vitals  Enc Vitals Group     BP 04/30/15 1515 126/95 mmHg     Pulse Rate 04/30/15 1515 100     Resp 04/30/15 1515 18     Temp 04/30/15 1515 97.8 F (36.6 C)     Temp Source 04/30/15 1515 Oral     SpO2 04/30/15 1515 96 %     Weight 04/30/15 1515 130 lb (58.968 kg)     Height 04/30/15 1515  (1.727 m)     Head Cir --      Peak Flow --      Pain Score --      Pain Loc --      Pain Edu? --      Excl. in GC? --     Constitutional: Patient is alert and able to answer questions appropriately. He is chronically ill-appearing with right facial droop and right-sided hemiplegia.  Eyes: Conjunctivae are normal.  EOMI. no scleral icterus. Head: Atraumatic. Nose: No congestion/rhinnorhea. Mouth/Throat: Mucous membranes are moist. Poor dentition. Neck: No stridor.  Supple.  No JVD. Cardiovascular: Normal rate, regular rhythm. No murmurs, rubs or gallops.  Respiratory: Normal respiratory effort.  No retractions. Lungs CTAB.  No wheezes, rales or ronchi. Gastrointestinal: Soft and nontender. No distention. No peritoneal signs. No guarding or rebound. Musculoskeletal: No LE edema.  Neurologic:  Patient has intermittent clear speech and occasional slurred speech which his son says is baseline. He has decreased  strength in the right upper extremity but moves all extremities well. Skin:  Skin is warm, dry and intact. No rash noted. Psychiatric: Mood and affect are normal. Speech and behavior are normal.  Normal judgement.  ____________________________________________   LABS (all labs ordered are listed, but only abnormal results are displayed)  Labs Reviewed  BASIC METABOLIC PANEL - Abnormal; Notable for the following:    Glucose, Bld 112 (*)    All other components within normal limits  CBC - Abnormal; Notable for the following:    RBC 4.26 (*)  Hemoglobin 12.4 (*)    HCT 37.2 (*)    RDW 15.1 (*)    All other components within normal limits  URINALYSIS COMPLETEWITH MICROSCOPIC ([facility] ONLY) - Abnormal; Notable for the following:    Color, Urine YELLOW (*)    APPearance HAZY (*)    Bilirubin Urine 1+ (*)    Ketones, ur TRACE (*)    Protein, ur 100 (*)    Bacteria, UA RARE (*)    Squamous Epithelial / LPF 0-5 (*)    All other components within normal limits  RAPID INFLUENZA A&B ANTIGENS ([facility] ONLY)  TROPONIN I   ____________________________________________  EKG  ED ECG REPORT I, Rockne MenghiniNorman, Anne-Caroline, the attending physician, personally viewed and interpreted this ECG.   Date: 04/30/2015  EKG Time: 2013  Rate: 75  Rhythm: normal sinus rhythm  Axis: Normal  Intervals:none  ST&T Change: No ST elevation.  ____________________________________________  RADIOLOGY  No results found.  ____________________________________________   PROCEDURES  Procedure(s) performed: None  Critical Care performed: No ____________________________________________   INITIAL IMPRESSION / ASSESSMENT AND PLAN / ED COURSE  Pertinent labs & imaging results that were available during my care of the patient were reviewed by me and considered in my medical decision making (see chart for details).  70 y.o. male with a history of stroke presenting with several hours of self-limited nausea and  vomiting, now resolved. The patient is afebrile and has otherwise stable vital signs. His abdomen is benign and there is no evidence of acute surgical pathology. The patient has labs which were obtained from triage that showed normal white blood cell count, mild anemia, and normal electrolytes except for a blood sugar 112. His troponin is also negative. Plan to treat with an antiemetic and do a by mouth challenge. I'll also get a urinalysis to evaluate for UTI.  ----------------------------------------- 8:48 PM on 04/30/2015 -----------------------------------------  The patient does not have urinary tract infection. He is able to tolerate liquid by mouth. I will plan to discharge the patient home with close PMD follow-up.  ____________________________________________  FINAL CLINICAL IMPRESSION(S) / ED DIAGNOSES  Final diagnoses:  Non-intractable vomiting with nausea, vomiting of unspecified type      NEW MEDICATIONS STARTED DURING THIS VISIT:  New Prescriptions   ONDANSETRON (ZOFRAN-ODT) 4 MG DISINTEGRATING TABLET    Take 1 tablet (4 mg total) by mouth once.     Rockne MenghiniAnne-Caroline Mandalyn Pasqua, MD 04/30/15 2055

## 2015-04-30 NOTE — Discharge Instructions (Signed)
Please take a clear liquid diet for the next 24-48 hours. Then advance to a bland BRAT diet as described. You may take Zofran for nausea.  Return to the emergency department if you develop severe pain, inability to keep down fluids, fever, or any other symptoms concerning to you.

## 2015-04-30 NOTE — ED Notes (Addendum)
Pt denies GI bleeding, cramping. Tolerates food and water "when he wants to per family"

## 2015-04-30 NOTE — ED Notes (Signed)
Per brother patient woke this am with nausea and vomiting, getting steadily weaker during day. Patient denies pain.

## 2015-05-01 ENCOUNTER — Telehealth: Payer: Self-pay | Admitting: Emergency Medicine

## 2015-05-01 NOTE — ED Notes (Signed)
[facility] [place] called to clarify zofran frequency.  Per dr Scotty Courtstafford can give every 6 hours as needed.

## 2015-05-02 ENCOUNTER — Ambulatory Visit: Payer: Medicare Other | Admitting: Physical Therapy

## 2015-05-02 ENCOUNTER — Ambulatory Visit: Payer: Medicare Other | Admitting: Occupational Therapy

## 2015-05-02 DIAGNOSIS — R279 Unspecified lack of coordination: Secondary | ICD-10-CM | POA: Diagnosis not present

## 2015-05-02 DIAGNOSIS — M6281 Muscle weakness (generalized): Secondary | ICD-10-CM

## 2015-05-02 DIAGNOSIS — R531 Weakness: Secondary | ICD-10-CM

## 2015-05-02 DIAGNOSIS — R262 Difficulty in walking, not elsewhere classified: Secondary | ICD-10-CM

## 2015-05-02 NOTE — Therapy (Signed)
[place] MAIN [facility] SERVICES [address] [place], [place], [address] Phone: [phone]   Fax:  [phone]  Occupational Therapy Treatment  Patient Details  Name: Michael Goodwin MRN: [id number] Date of Birth: Sep 22, 1945 Referring Provider: Lynford Goodwin  Encounter Date: 05/02/2015      OT End of Session - 05/02/15 1650    Visit Number 13   Number of Visits 24   Date for OT Re-Evaluation 05/02/15   Authorization Type G-code 5 of 10 more   OT Start Time 4580   OT Stop Time 1645   OT Time Calculation (min) 38 min   Activity Tolerance Patient tolerated treatment well   Behavior During Therapy [facility] for tasks assessed/performed      Past Medical History  Diagnosis Date  . Stroke ([place])   . Essential hypertension 07/06/2014  . 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 Hemiplegia affecting right dominant side ([place]) 07/06/2014    Past Surgical History  Procedure Laterality Date  . No past surgeries      There were no vitals filed for this visit.  Visit Diagnosis:  Lack of coordination  Weakness generalized      Subjective Assessment - 05/02/15 1612    Subjective  Pt. reports having too much pain today to even shave.   Pertinent History This patient is a 70 year old male who came to [facility] out patient after suffering a stroke (L thalamic infarct) on May 26th 2016. It resulted in R UE and LE weakness and facial weakness. He also demonstrates sensory loss  Patient spent one month at [facility] before going home. He recieved home health therapy. Daughter reports patient weakness worse since stoping home healt.He had been living alone, but now daughter lives at his daughter's house.,  He needs assist  with dressing, bathing, shaving, and toileting.      Limitations Walking, ADL, R UE function.   Patient Stated Goals To be able to use his RUE for functional tasks.   Currently in Pain? Yes   Pain Score 8    Pain Location Abdomen    Pain Orientation Left   Pain Type Chronic pain           BP Level: 111/76           OT Treatments/Exercises (OP) - 05/02/15 1706    Fine Motor Coordination   Other Fine Motor Exercises Pt. worked on grasping and stacking 11/2" [place] style discs with his thumb and 2nd digit. Pt. worked on grasping and requires cues to hold the disc while extending his wrist prior to stacking. Pt. worked on grasping 1" cubes, 1/2" beads and placing them in a container. Pt. worked on closing, and fastening the container with his right hand. Pt. requires consistent cues to incorporate right hand, and not use left hand during the tasks.                     OT Long Term Goals - 03/22/15 1714    OT LONG TERM GOAL #1   Title Patient will demonstrate ability to place one of 9 hole peg test peg to improve fine motor to be abel to fasten pants fasteners.   Baseline unable   Time 12   Period Weeks   Status Not Met   OT LONG TERM GOAL #2   Title Will improve right hand and shoulder function sufficiently to be able to shave self.   Baseline daughter has to shave  him.   Time 12   Period Weeks   Status Partially Met   OT LONG TERM GOAL #3   Title Will improve balance and left upper extremity function to be able to make a simple snack.   Baseline Patient does no food prep.   Time 12   Period Weeks   Status New   OT LONG TERM GOAL #4   Title Will improve right hand function or learn to write with left hand so that his writing is 80% legible.    Baseline Hand writing is not legible   Time 12   Period Weeks   Status Partially Met   OT LONG TERM GOAL #5   Title Will improve R shoulder flexion to 90o to be able to move ADL objects on a table    Baseline shoulder flexion limited to 50o of flexion.   Time 12   Period Weeks   Status Partially Met   Long Term Additional Goals   Additional Long Term Goals Yes   OT LONG TERM GOAL #6   Title Pt. will increase right wrist extension by 10  degrees to be able to use grooming care items.   Baseline -5/24   Time 8   Period Weeks   Status New               Plan - 05/02/15 1651    Clinical Impression Statement Treatment was modified today secondary to pt. reports of 8/10 left sided abdominal pain. Pt. participated in tabletop hand coordination tasks with his right hand. Pt. continues to present with difficulty grasping objects using thumb and 2nd digit.. Pt. appears distaracted by abdominal dicomfort today.   Pt will benefit from skilled therapeutic intervention in order to improve on the following deficits (Retired) Decreased coordination;Decreased endurance;Decreased balance;Impaired UE functional use;Impaired tone;Decreased strength   Rehab Potential Good   Clinical Impairments Affecting Rehab Potential RUE impaired tone, ROM, strength, coordination, limiting use during ADL functional tasks.   OT Frequency 2x / week   OT Duration 12 weeks   OT Treatment/Interventions Neuromuscular education;Therapeutic exercise;Energy conservation;Manual Therapy;Therapeutic activities;Therapeutic exercises;Passive range of motion   Consulted and Agree with Plan of Care Patient        Problem List Patient Active Problem List   Diagnosis Date Noted  . Tobacco use disorder 12/12/2014  . Hemiparesis ([place]) 12/12/2014  . 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 Chronic pain syndrome 12/04/2014  . Alterations of sensations following CVA (cerebrovascular accident) 07/10/2014  . Acute respiratory failure with hypoxia ([place]) 07/07/2014  . Abnormal urinalysis 07/07/2014  . Hypokalemia 07/07/2014  . Dehydration 07/07/2014  . Metabolic encephalopathy 86/76/1950  . Aortic root dilatation (48 mm May 2016) 07/07/2014  . Ascending aorta dilatation (29m May 2016) 07/07/2014  . ICH (intracerebral hemorrhage) ([place] 07/07/2014  . Nontraumatic thalamic hemorrhage (HHun[place] 07/06/2014  . Hemiplegia affecting right dominant side (HCammack [place] 07/06/2014   . Cognitive deficit due to recent stroke 07/06/2014  . IVH (intraventricular hemorrhage) ([place] 07/06/2014  . Essential hypertension 07/06/2014  . Intracranial hemorrhage (HCC)    EHarrel Carina MS, OTR/L   EHarrel Carina3/22/2017, 5:07 PM  C[place]MAIN [facility]aylor [facility]cl[facility] CenterSERVICES [address] [place] [address]Phone: [phone]  Fax:  [phone] Name: CErland VivasMRN: [id number] of Birth: 6Dec 11, 1947

## 2015-05-03 ENCOUNTER — Encounter: Payer: Self-pay | Admitting: Physical Therapy

## 2015-05-03 NOTE — Therapy (Signed)
[place] MAIN [facility] SERVICES [address] [place], [place], [address] Phone: [phone]   Fax:  [phone]  Physical Therapy Treatment  Patient Details  Name: Michael Goodwin MRN: [id number] Date of Birth: 1945-08-15 Referring Provider: Alysia Penna  Encounter Date: 05/02/2015      PT End of Session - 05/03/15 1741    Visit Number 15   Number of Visits 25   Date for PT Re-Evaluation 05/14/15   Authorization Type 4   Authorization Time Period 10   PT Start Time 1645   PT Stop Time 1725   PT Time Calculation (min) 40 min   Equipment Utilized During Treatment Gait belt   Activity Tolerance Patient limited by pain;Patient limited by lethargy   Behavior During Therapy Flat affect      Past Medical History  Diagnosis Date  . Stroke ([place])   . Essential hypertension 07/06/2014  . 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 Hemiplegia affecting right dominant side ([place]) 07/06/2014    Past Surgical History  Procedure Laterality Date  . No past surgeries      There were no vitals filed for this visit.  Visit Diagnosis:  Lack of coordination  Weakness generalized  Difficulty walking  Muscle weakness      Subjective Assessment - 05/03/15 1739    Subjective Patient reports feeling increased left sided pain and was unable to tolerate a lot of activity during OT; Patient appears very lethargic and fatigued.    Pertinent History Pt sufferred a L thalamic infarct May 26,2016 with weakness in RUE/RLE as well as facial weakness. Pt also complains of R sided numbness/tingling. Pt spent a total of 30 days at [facility] and he was discharged directly home. Pt did not discharge to SNF but once home he has been receiving [facility]. His last PT session was was last week and even since ending therapy his daughter reports he seems like he has gotten weaker. Pt was living in his own home but daughter moved him into her house so that  he could receive better care. Pt reports that he has the most difficulty with transfers and long distance ambulation.   Limitations Walking   How long can you walk comfortably? a few hundred feet   Patient Stated Goals To improve independent transfers, ambulation, and bathing.    Currently in Pain? Yes   Pain Score 8    Pain Location Abdomen   Pain Orientation Left   Pain Descriptors / Indicators Aching;Sore   Pain Type Chronic pain       TREATMENT:  Patient ambulated with hemiwalker, x45 feet x3 reps with min A for balance and mod VCs to increase RLE hip flexion during swing for better foot clearance;     Supine: PT performed passive LE stretches to improve tissue extensibility: Lumbar trunk rotation x2 min with overpressure at end range to increase hip/lumbar mobility; Single knee to chest 15 sec hold x2 bilaterally; Hamstring stretch 15 sec hold x2 bilaterally; Piriformis stretch 15 sec hold x2 bilaterally; Patient reports increased hip discomfort with passive stretches with increased tightness at end range. He reports less pain after stretches;  Patient transitioned supine to prone, rolling to left side, independently: Passive quad stretch 20 sec hold x2 bilaterally; Active hamstring curl x10 bilaterally with min VCs to avoid hip ER for better knee and hip control and better coordination; Press up on elbows, scapular protraction x10 reps with mod Vcs to increase push through  RUE for better scapular strengthening on right side;  Leg press: BLE 75# x15 with min VCs to increase hip abduction for better hip/knee control; RLE only 45# 2x10 with min VCs to avoid terminal knee extension for less genu recurvatum and better quad control;  Patient very fatigued today and required mod VCs for motivation during LE exercise;                     PT Education - 05/03/15 1740    Education provided Yes   Education Details LE stretches, gait safety;    Person(s) Educated  Patient   Methods Explanation;Verbal cues   Comprehension Verbalized understanding;Returned demonstration;Verbal cues required             PT Long Term Goals - 04/16/15 1648    PT LONG TERM GOAL #1   Title Pt will be independent and compliant with HEP in order to improve strength, transfers, and balance   Time 4   Period Weeks   Status On-going   PT LONG TERM GOAL #2   Title Pt will demonstrate increase in gait speed by at least 0.14 m/s in order to demonstrate clinically significant improvement in speed to improve function at home.   Baseline 02/01/15: 26.8 = 0.37 m/s   Time 4   Period Weeks   Status Partially Met   PT LONG TERM GOAL #3   Title Pt wil decrease TUG to <14 sec  in order to demonstrate improved LE strength and balance in order to decrease falls.    Baseline 02/01/15: 31.48 seconds   Time 4   Period Weeks   Status Partially Met   PT LONG TERM GOAL #4   Title patient will increase Berg Balance Assessment score by >6  points in order to demonstrate improved balance and safety with ADLs by 03/29/14   Time 4   Period Weeks   Status Achieved   PT LONG TERM GOAL #5   Title Patient will increase 6 min walk distance by >150 feet to demonstrate improved functional gait ability with home/community ambulation. by 03/29/14   Time 4   Period Weeks   Status Achieved               Plan - 05/03/15 1741    Clinical Impression Statement Patient reports increased soreness along left side today; PT instructed patient in BLE stretches to increase flexibility throughout LE and trunk. Patient reports less soreness after stretches. Finished with LE strengthening exercise. Patient fatigued and had increased difficulty with gait tasks with increased right foot drag. Patient would benefit from additional skilled PT Intervention to improve LE strength, balance and gait safety;    Pt will benefit from skilled therapeutic intervention in order to improve on the following deficits  Abnormal gait;Decreased balance;Decreased cognition;Decreased knowledge of precautions;Decreased mobility;Decreased range of motion;Decreased safety awareness;Decreased strength;Difficulty walking;Hypomobility;Impaired tone;Impaired UE functional use   Rehab Potential Good   Clinical Impairments Affecting Rehab Potential Positive: motivation, family suppport, current progress, Negative: decreased compliance with HEP, RUE/RLE weakness   PT Frequency 2x / week   PT Duration 8 weeks   PT Treatment/Interventions ADLs/Self Care Home Management;Aquatic Therapy;Biofeedback;Electrical Stimulation;Iontophoresis 74m/ml Dexamethasone;Traction;Ultrasound;Contrast Bath;DME Instruction;Gait training;Stair training;Functional mobility training;Therapeutic activities;Therapeutic exercise;Balance training;Neuromuscular re-education;Cognitive remediation;Patient/family education;Orthotic Fit/Training;Manual techniques;Energy conservation   PT Next Visit Plan work on LE strengthening, gait training, and balance;    PT Home Exercise Plan continue as previously given;    Consulted and Agree with Plan of Care Patient;Family member/caregiver  Family Member Consulted Daughter        Problem List Patient Active Problem List   Diagnosis Date Noted  . Tobacco use disorder 12/12/2014  . Hemiparesis ([place]) 12/12/2014  . 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 Chronic pain syndrome 12/04/2014  . Alterations of sensations following CVA (cerebrovascular accident) 07/10/2014  . Acute respiratory failure with hypoxia ([place]) 07/07/2014  . Abnormal urinalysis 07/07/2014  . Hypokalemia 07/07/2014  . Dehydration 07/07/2014  . Metabolic encephalopathy 91/03/8900  . Aortic root dilatation (48 mm May 2016) 07/07/2014  . Ascending aorta dilatation (56m May 2016) 07/07/2014  . ICH (intracerebral hemorrhage) ([place] 07/07/2014  . Nontraumatic thalamic hemorrhage ([place] 07/06/2014  . Hemiplegia affecting right dominant side  ([place] 07/06/2014  . Cognitive deficit due to recent stroke 07/06/2014  . IVH (intraventricular hemorrhage) ([place] 07/06/2014  . Essential hypertension 07/06/2014  . Intracranial hemorrhage ([place]     Amar Sippel PT, DPT 05/03/2015, 5:43 PM  [place] [facility] [address] [address]Phone: [phone]  Fax:  [phone] Name: CNatasha PaulsonMRN: [id number] of Birth: 606-18-1947

## 2015-05-07 ENCOUNTER — Encounter: Payer: Self-pay | Admitting: Physical Therapy

## 2015-05-07 ENCOUNTER — Ambulatory Visit: Payer: Medicare Other | Admitting: Occupational Therapy

## 2015-05-07 ENCOUNTER — Ambulatory Visit: Payer: Medicare Other | Admitting: Physical Therapy

## 2015-05-07 DIAGNOSIS — M6281 Muscle weakness (generalized): Secondary | ICD-10-CM

## 2015-05-07 DIAGNOSIS — R279 Unspecified lack of coordination: Secondary | ICD-10-CM | POA: Diagnosis not present

## 2015-05-07 DIAGNOSIS — R262 Difficulty in walking, not elsewhere classified: Secondary | ICD-10-CM

## 2015-05-07 DIAGNOSIS — R5383 Other fatigue: Secondary | ICD-10-CM

## 2015-05-07 NOTE — Therapy (Signed)
[place] MAIN [facility] SERVICES [address] [place], [place], [address] Phone: [phone]   Fax:  [phone]  Occupational Therapy Treatment  Patient Details  Name: Michael Goodwin MRN: [id number] Date of Birth: 1945-06-02 Referring Provider: [name]  Encounter Date: 05/07/2015      OT End of Session - 05/07/15 1718    Visit Number 14   Number of Visits 24   Date for OT Re-Evaluation 05/02/15   Authorization Type G-code 6 of 10 more   OT Start Time 9702   OT Stop Time 1645   OT Time Calculation (min) 30 min      Past Medical History  Diagnosis Date  . Stroke ([place])   . Essential hypertension 07/06/2014  . 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 Hemiplegia affecting right dominant side ([place]) 07/06/2014    Past Surgical History  Procedure Laterality Date  . No past surgeries      There were no vitals filed for this visit.  Visit Diagnosis:  Muscle weakness (generalized)      Subjective Assessment - 05/07/15 1716    Subjective  Pt. reports side pain is better today.   Pertinent History This patient is a 70 year old male who came to [facility] out patient after suffering a stroke (L thalamic infarct) on May 26th 2016. It resulted in R UE and LE weakness and facial weakness. He also demonstrates sensory loss  Patient spent one month at [facility] before going home. He recieved home health therapy. Daughter reports patient weakness worse since stoping home healt.He had been living alone, but now daughter lives at his daughter's house.,  He needs assist  with dressing, bathing, shaving, and toileting.      Patient Stated Goals To be able to use his RUE for functional tasks.   Currently in Pain? No/denies   Pain Score 0-No pain         Manual Therapy  Pt. performed scapular mobilizations for scapular elevation, depression, abduction/adduction in sidelying and sitting with a mirror for visual cues. Scapular mobes were  performed to help normalize tone, and prepare UE for ROM and functional use.  UE There. Ex.  Pt. performed PROM in right UE in reflex inhibiting patterns to reduce tone and prepare UE for functional use in supine. Pt. performed right UE AAROM scapular elevation, depression, protraction and retraction, shoulder flexion, extension, abduction, horizontal abduction and adduction.  Elbow flexion and extension. Verbal and tactile cues were required for proper technique. Pt. Worked on AROM with right wrist extension, and thumb opposition.  Neuromuscular re-ed  Pt. performed weightbearing tasks through Right UEin multiple planes in preparation for UE ROM.                       OT Education - 05/07/15 1717    Education provided Yes   Education Details Ue ROM, and functional hand use.             OT Long Term Goals - 03/22/15 1714    OT LONG TERM GOAL #1   Title Patient will demonstrate ability to place one of 9 hole peg test peg to improve fine motor to be abel to fasten pants fasteners.   Baseline unable   Time 12   Period Weeks   Status Not Met   OT LONG TERM GOAL #2   Title Will improve right hand and shoulder function sufficiently to be able to shave self.  Baseline daughter has to shave him.   Time 12   Period Weeks   Status Partially Met   OT LONG TERM GOAL #3   Title Will improve balance and left upper extremity function to be able to make a simple snack.   Baseline Patient does no food prep.   Time 12   Period Weeks   Status New   OT LONG TERM GOAL #4   Title Will improve right hand function or learn to write with left hand so that his writing is 80% legible.    Baseline Hand writing is not legible   Time 12   Period Weeks   Status Partially Met   OT LONG TERM GOAL #5   Title Will improve R shoulder flexion to 90o to be able to move ADL objects on a table    Baseline shoulder flexion limited to 50o of flexion.   Time 12   Period Weeks   Status  Partially Met   Long Term Additional Goals   Additional Long Term Goals Yes   OT LONG TERM GOAL #6   Title Pt. will increase right wrist extension by 10 degrees to be able to use grooming care items.   Baseline -5/24   Time 8   Period Weeks   Status New               Plan - 05/07/15 1719    Clinical Impression Statement Pt. presents with limites RUE AROM and functioninf which hinders his ability to use during ADL and IADL tasks. Pt. presents with limited right elbow, and wrist extension.   Pt will benefit from skilled therapeutic intervention in order to improve on the following deficits (Retired) Decreased coordination;Decreased endurance;Decreased balance;Impaired UE functional use;Impaired tone;Decreased strength   Rehab Potential Good   OT Frequency 2x / week   OT Duration 12 weeks   OT Treatment/Interventions Neuromuscular education;Therapeutic exercise;Energy conservation;Manual Therapy;Therapeutic activities;Therapeutic exercises;Passive range of motion   Consulted and Agree with Plan of Care Patient        Problem List Patient Active Problem List   Diagnosis Date Noted  . Tobacco use disorder 12/12/2014  . Hemiparesis ([place]) 12/12/2014  . 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 Chronic pain syndrome 12/04/2014  . Alterations of sensations following CVA (cerebrovascular accident) 07/10/2014  . Acute respiratory failure with hypoxia ([place]) 07/07/2014  . Abnormal urinalysis 07/07/2014  . Hypokalemia 07/07/2014  . Dehydration 07/07/2014  . Metabolic encephalopathy 69/62/9528  . Aortic root dilatation (48 mm May 2016) 07/07/2014  . Ascending aorta dilatation (77m May 2016) 07/07/2014  . ICH (intracerebral hemorrhage) ([place] 07/07/2014  . Nontraumatic thalamic hemorrhage ([place] 07/06/2014  . Hemiplegia affecting right dominant side ([place] 07/06/2014  . Cognitive deficit due to recent stroke 07/06/2014  . IVH (intraventricular hemorrhage) ([place] 07/06/2014  .  Essential hypertension 07/06/2014  . Intracranial hemorrhage (H[facility])    EHarrel Carina MS, OTR/L  EHarrel Carina3/27/2017, 5:29 PM  [place] [facility]ICES [address] [place] [address]Phone: [phone]  Fax:  [phone] Name: Michael DoverspikeMRN: [id number] of Birth: [birth year]

## 2015-05-07 NOTE — Patient Instructions (Signed)
Manual Therapy  Pt. performed scapular mobilizations for scapular elevation, depression, abduction/adduction in sidelying and sitting with a mirror for visual cues. Scapular mobes were performed to help normalize tone, and prepare UE for ROM and functional use.  UE There. Ex.  Pt. performed PROM in right UE in reflex inhibiting patterns to reduce tone and prepare UE for functional use in supine. Pt. performed right UE AAROM scapular elevation, depression, protraction and retraction, shoulder flexion, extension, abduction, horizontal abduction and adduction.  Elbow flexion and extension. Verbal and tactile cues were required for proper technique. Pt. Worked on AROM with right wrist extension, and thumb opposition.  Neuromuscular re-ed  Pt. performed weightbearing tasks through Right UEin multiple planes in preparation for UE ROM.

## 2015-05-08 NOTE — Therapy (Signed)
[place] [facility] REHAB SERVICES [address] [place], [place], [address] Phone: [phone]   Fax:  [phone]  Physical Therapy Treatment  Patient Details  Name: Michael Goodwin MRN: [id number] Date of Birth: 06/21/1945 Referring Provider: Kirsteins, Andrew  Encounter Date: 05/07/2015      PT End of Session - 05/08/15 1040    Visit Number 16   Number of Visits 25   Date for PT Re-Evaluation 05/14/15   Authorization Type 5   Authorization Time Period 10   PT Start Time 1645   PT Stop Time 1725   PT Time Calculation (min) 40 min   Equipment Utilized During Treatment Gait belt   Activity Tolerance Patient limited by pain;Patient limited by lethargy   Behavior During Therapy Flat affect      Past Medical History  Diagnosis Date  . Stroke ([facility])   . Essential hypertension 07/06/2014  . 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 Hemiplegia affecting right dominant side ([facility]) 07/06/2014    Past Surgical History  Procedure Laterality Date  . No past surgeries      There were no vitals filed for this visit.  Visit Diagnosis:  Muscle weakness (generalized)  Lack of coordination  Difficulty walking  Lethargy      Subjective Assessment - 05/08/15 1039    Subjective Patient reports increased fatigue today; He denies any side pain. He reports doing exercises 1x over last week and only walking 1 or 2 days last week.    Pertinent History Pt sufferred a L thalamic infarct May 26,2016 with weakness in RUE/RLE as well as facial weakness. Pt also complains of R sided numbness/tingling. Pt [facility] a [facility] of [facility] at  Hospital and he was discharged directly home. Pt did not discharge to SNF but once home he has been receiving [facility] PT. His last PT session was was last week and even since ending therapy his daughter reports he seems like he has gotten weaker. Pt was living in his own home but daughter moved him into her house so that  he could receive better care. Pt reports that he has the most difficulty with transfers and long distance ambulation.   Limitations Walking   How long can you walk comfortably? a few hundred feet   Patient Stated Goals To improve independent transfers, ambulation, and bathing.    Currently in Pain? No/denies      TREATMENT:  PT educated patient in gait training, 200 feet x2 laps with max verbal/tactile cues for correct gait technique; Patient ambulated with SPC, with min-mod A for balance. He exhibits decreased right foot clearance with slower swing phase with uneven cadence.PT educated patient in 2 point gait pattern as compared to 3 point gait pattern with SPC. Patient required max tactile cues with SPC and min A for cane placement during gait tasks. He would ambulate approximately 20-30 feet with better speed and then fatigue quickly requiring standing or sitting rest break.   Sit<>Stand from bench without rail assist x10 reps with mod VCs to increase forward trunk lean and improve push through BLE for better transfer ability; PT reeducated patient in HEP with cues to work on tband exercise more especially with side stepping and hip abduction;  Patient very fatigued today; He admits that he has not been eating well. PT talked with patients daughter/caregiver regarding good nutrition to improve energy output. Patient agrees that he will try to eat better.  Educated patient and caregiver that goals   will be reassessed 1st week of April and that he will need to make improvement to continue with PT;                            PT Education - 05/08/15 1040    Education provided Yes   Education Details gait safety, LE strengthening   Person(s) Educated Patient   Methods Explanation;Verbal cues   Comprehension Verbalized understanding;Returned demonstration;Verbal cues required             PT Long Term Goals - 04/16/15 1648    PT LONG TERM GOAL #1   Title Pt will  be independent and compliant with HEP in order to improve strength, transfers, and balance   Time 4   Period Weeks   Status On-going   PT LONG TERM GOAL #2   Title Pt will demonstrate increase in gait speed by at least 0.14 m/s in order to demonstrate clinically significant improvement in speed to improve function at home.   Baseline 02/01/15: 26.8 = 0.37 m/s   Time 4   Period Weeks   Status Partially Met   PT LONG TERM GOAL #3   Title Pt wil decrease TUG to <14 sec  in order to demonstrate improved LE strength and balance in order to decrease falls.    Baseline 02/01/15: 31.48 seconds   Time 4   Period Weeks   Status Partially Met   PT LONG TERM GOAL #4   Title patient will increase Berg Balance Assessment score by >6  points in order to demonstrate improved balance and safety with ADLs by 03/29/14   Time 4   Period Weeks   Status Achieved   PT LONG TERM GOAL #5   Title Patient will increase 6 min walk distance by >150 feet to demonstrate improved functional gait ability with home/community ambulation. by 03/29/14   Time 4   Period Weeks   Status Achieved               Plan - 05/08/15 1043    Clinical Impression Statement Patient instructed in advanced gait techniques, using SPC with cues for 2 point gait pattern vs 3 point gait pattern. Patient had difficulty picking up RLE due to weakness and foot drag requiring increased cues for better foot clearance. Patient fatigued quickly requiring standing or sitting rest breaks after 20-30 feet of ambulation. He was very lethargic and admits to not eating well or doing HEP much in last week. PT talked with patient's daughter about trying other foods such as supplement shakes to improve patients nutrition for better energy.    Pt will benefit from skilled therapeutic intervention in order to improve on the following deficits Abnormal gait;Decreased balance;Decreased cognition;Decreased knowledge of precautions;Decreased  mobility;Decreased range of motion;Decreased safety awareness;Decreased strength;Difficulty walking;Hypomobility;Impaired tone;Impaired UE functional use   Rehab Potential Good   Clinical Impairments Affecting Rehab Potential Positive: motivation, family suppport, current progress, Negative: decreased compliance with HEP, RUE/RLE weakness   PT Frequency 2x / week   PT Duration 8 weeks   PT Treatment/Interventions ADLs/Self Care Home Management;Aquatic Therapy;Biofeedback;Electrical Stimulation;Iontophoresis 4mg/ml Dexamethasone;Traction;Ultrasound;Contrast Bath;DME Instruction;Gait training;Stair training;Functional mobility training;Therapeutic activities;Therapeutic exercise;Balance training;Neuromuscular re-education;Cognitive remediation;Patient/family education;Orthotic Fit/Training;Manual techniques;Energy conservation   PT Next Visit Plan work on LE strengthening, gait training, and balance;    PT Home Exercise Plan continue as previously given;    Consulted and Agree with Plan of Care Patient;Family member/caregiver   Family Member Consulted Daughter          Problem List Patient Active Problem List   Diagnosis Date Noted  . Tobacco use disorder 12/12/2014  . Hemiparesis (H[facility]) 12/12/2014  . 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 Chronic pain syndrome 12/04/2014  . Alterations of sensations following CVA (cerebrovascular accident) 07/10/2014  . Acute respiratory failure with hypoxia (HCC) 07/07/2014  . Abnormal urinalysis 07/07/2014  . Hypokalemia 07/07/2014  . Dehydration 07/07/2014  . Metabolic encephalopathy 07/07/2014  . Aortic root dilatation (48 mm May 2016) 07/07/2014  . 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 ICH (intracerebral hemorrhage) (HCC) 07/07/2014  . 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 Hemiplegia affecting right dominant side (HCC) 07/06/2014  . Cognitive deficit due to recent stroke 07/06/2014  . IVH (intraventricular  hemorrhage) (HCC) 07/06/2014  . Essential hypertension 07/06/2014  . Intracranial hemorrhage (HCC)     Trotter,Margaret PT, DPT 05/08/2015, 10:49 AM  [place] [facility] REH[facility] SERVICES [address] [place], , [address] Phone: [phone]   Fax:  [phone]  Name: Manolito Michelini MRN: [id number] Date of Birth: 01/27/1946     

## 2015-05-09 ENCOUNTER — Ambulatory Visit: Payer: Medicare Other | Admitting: Occupational Therapy

## 2015-05-09 ENCOUNTER — Ambulatory Visit: Payer: Medicare Other | Admitting: Physical Therapy

## 2015-05-09 DIAGNOSIS — R279 Unspecified lack of coordination: Secondary | ICD-10-CM | POA: Diagnosis not present

## 2015-05-09 DIAGNOSIS — M6281 Muscle weakness (generalized): Secondary | ICD-10-CM

## 2015-05-09 DIAGNOSIS — R5383 Other fatigue: Secondary | ICD-10-CM

## 2015-05-09 DIAGNOSIS — R262 Difficulty in walking, not elsewhere classified: Secondary | ICD-10-CM

## 2015-05-09 NOTE — Therapy (Signed)
[place] MAIN [facility] SERVICES [address] [place], [place], [address] Phone: [phone]   Fax:  [phone]  Occupational Therapy Treatment  Patient Details  Name: Blair Lundeen MRN: [id number] Date of Birth: 12-28-1945 Referring Provider: [name]  Encounter Date: 05/09/2015      OT End of Session - 05/09/15 1616    Visit Number 15   Number of Visits 24   Authorization Type G-code 7 of 10 more   OT Start Time 1610   OT Stop Time 1645   OT Time Calculation (min) 35 min      Past Medical History  Diagnosis Date  . Stroke ([place])   . Essential hypertension 07/06/2014  . Nontraumatic thalamic hemorrhage ([place]) 07/06/2014  . Hemiplegia affecting right dominant side ([place]) 07/06/2014    Past Surgical History  Procedure Laterality Date  . No past surgeries      There were no vitals filed for this visit.  Visit Diagnosis:  Muscle weakness (generalized)      Subjective Assessment - 05/09/15 1615    Subjective  Pt. reports his LE's feel weak.   Pertinent History This patient is a 70 year old male who came to [facility] out patient after suffering a stroke (L thalamic infarct) on May 26th 2016. It resulted in R UE and LE weakness and facial weakness. He also demonstrates sensory loss  Patient spent one month at [facility] before going home. He recieved home health therapy. Daughter reports patient weakness worse since stoping home healt.He had been living alone, but now daughter lives at his daughter's house.,  He needs assist  with dressing, bathing, shaving, and toileting.      Limitations Walking, ADL, R UE function.   Patient Stated Goals To be able to use his RUE for functional tasks.   Currently in Pain? No/denies      OT TREATMENT    Therapeutic Exercise: Pt. performed UE ther. Ex. at the tabletop for AAROM in all planes. Pt. Performed place and hold, and isometric ex. For elbow flexion/extension, forearm supination/pronation wrist  ext., thumb abd.and thumb opposition ex.                              OT Long Term Goals - 03/22/15 1714    OT LONG TERM GOAL #1   Title Patient will demonstrate ability to place one of 9 hole peg test peg to improve fine motor to be abel to fasten pants fasteners.   Baseline unable   Time 12   Period Weeks   Status Not Met   OT LONG TERM GOAL #2   Title Will improve right hand and shoulder function sufficiently to be able to shave self.   Baseline daughter has to shave him.   Time 12   Period Weeks   Status Partially Met   OT LONG TERM GOAL #3   Title Will improve balance and left upper extremity function to be able to make a simple snack.   Baseline Patient does no food prep.   Time 12   Period Weeks   Status New   OT LONG TERM GOAL #4   Title Will improve right hand function or learn to write with left hand so that his writing is 80% legible.    Baseline Hand writing is not legible   Time 12   Period Weeks   Status Partially Met   OT LONG TERM  GOAL #5   Title Will improve R shoulder flexion to 90o to be able to move ADL objects on a table    Baseline shoulder flexion limited to 50o of flexion.   Time 12   Period Weeks   Status Partially Met   Long Term Additional Goals   Additional Long Term Goals Yes   OT LONG TERM GOAL #6   Title Pt. will increase right wrist extension by 10 degrees to be able to use grooming care items.   Baseline -5/24   Time 8   Period Weeks   Status New               Plan - 05/09/15 1647    Clinical Impression Statement Pt. reported fatigue at the end of the session. Pt. with episode of low BP: 75/58  & 70/48 with Blood sugar is 86. Pt. presented with improved  wrist extension, and thumb oppostion today.   Pt will benefit from skilled therapeutic intervention in order to improve on the following deficits (Retired) Decreased coordination;Decreased endurance;Decreased balance;Impaired UE functional use;Impaired  tone;Decreased strength   Rehab Potential Good   Clinical Impairments Affecting Rehab Potential RUE impaired tone, ROM, strength, coordination, limiting use during ADL functional tasks.   OT Frequency 2x / week   OT Duration 12 weeks   OT Treatment/Interventions Neuromuscular education;Therapeutic exercise;Energy conservation;Manual Therapy;Therapeutic activities;Therapeutic exercises;Passive range of motion   Consulted and Agree with Plan of Care Patient   Family Member Consulted Daughter        Problem List Patient Active Problem List   Diagnosis Date Noted  . Tobacco use disorder 12/12/2014  . Hemiparesis ([place]) 12/12/2014  . Basal ganglia hemorrhage ([place]) 12/12/2014  . Chronic pain syndrome 12/04/2014  . Alterations of sensations following CVA (cerebrovascular accident) 07/10/2014  . Acute respiratory failure with hypoxia ([place]) 07/07/2014  . Abnormal urinalysis 07/07/2014  . Hypokalemia 07/07/2014  . Dehydration 07/07/2014  . Metabolic encephalopathy 79/89/2119  . Aortic root dilatation (48 mm May 2016) 07/07/2014  . Ascending aorta dilatation (54m May 2016) 07/07/2014  . ICH (intracerebral hemorrhage) ([place] 07/07/2014  . Nontraumatic thalamic hemorrhage ([place] 07/06/2014  . Hemiplegia affecting right dominant side ([place] 07/06/2014  . Cognitive deficit due to recent stroke 07/06/2014  . IVH (intraventricular hemorrhage) ([place] 07/06/2014  . Essential hypertension 07/06/2014  . Intracranial hemorrhage ([facility])    EHarrel Carina MS, OTR/L  EHarrel Carina3/29/2017, 5:15 PM  [place]MAIN [facility]ERVICES [address] [place] [address]Phone: [phone]  Fax:  [phone] Name: CTaurean JuMRN: [id number] of Birth: [birth year]

## 2015-05-09 NOTE — Patient Instructions (Signed)
OT TREATMENT    Therapeutic Exercise: Pt. performed UE ther. Ex. at the tabletop for AAROM in all planes. Pt. Performed place and hold, and isometric ex. For elbow flexion/extension, forearm supination/pronation wrist ext., thumb abd.and thumb opposition ex.

## 2015-05-10 ENCOUNTER — Encounter: Payer: Self-pay | Admitting: Physical Therapy

## 2015-05-10 NOTE — Therapy (Signed)
[place] MAIN [facility] SERVICES [address] [place], [place], [address] Phone: [phone]   Fax:  [phone]  Physical Therapy Treatment  Patient Details  Name: Michael Goodwin MRN: [id number] Date of Birth: 11-09-1945 Referring Provider: Alysia Penna  Encounter Date: 05/09/2015      PT End of Session - 05/10/15 1721    Visit Number 17   Number of Visits 25   Date for PT Re-Evaluation 05/14/15   Authorization Type 6   Authorization Time Period 10   PT Start Time 1645   PT Stop Time 1720   PT Time Calculation (min) 35 min   Equipment Utilized During Treatment --   Activity Tolerance Patient limited by lethargy;Treatment limited secondary to medical complications (Comment)   Behavior During Therapy Flat affect      Past Medical History  Diagnosis Date  . Stroke ([place])   . Essential hypertension 07/06/2014  . Nontraumatic thalamic hemorrhage ([place]) 07/06/2014  . Hemiplegia affecting right dominant side ([place]) 07/06/2014    Past Surgical History  Procedure Laterality Date  . No past surgeries      There were no vitals filed for this visit.  Visit Diagnosis:  Muscle weakness (generalized)  Lack of coordination  Difficulty walking  Lethargy      Subjective Assessment - 05/10/15 1535    Subjective Patient reports excessive fatigue this afternoon. OT reports that he was having a hard time participating in OT treatment. Patient reports trying to eat more but still doesn't like the taste of food;    Pertinent History Pt sufferred a L thalamic infarct May 26,2016 with weakness in RUE/RLE as well as facial weakness. Pt also complains of R sided numbness/tingling. Pt spent a total of 30 days at [facility] and he was discharged directly home. Pt did not discharge to SNF but once home he has been receiving [facility]. His last PT session was was last week and even since ending therapy his daughter reports he seems like he has gotten  weaker. Pt was living in his own home but daughter moved him into her house so that he could receive better care. Pt reports that he has the most difficulty with transfers and long distance ambulation.   Limitations Walking   How long can you walk comfortably? a few hundred feet   Patient Stated Goals To improve independent transfers, ambulation, and bathing.    Currently in Pain? No/denies        TREATMENT:  PT assessed patient's BP. In sitting, it was low: 75/58 & 70/48; PT also checked blood glucose which was 86; Patient reports increased dizziness and stating, "I just don't think I'm going to make it" PT assisted patient to mat table, patient was mod A for sit<>stand transfer and stand pivot to mat table; Had patient lay supine with BLE elevated; Patient's BP was checking again after 2-3 min of lying down; BP increased to 110/80; Patient reports still feeling heavily fatigued and "just not feeling right"  Patient instructed to remain lying on table for approximately 10-15 min for improved circulation and to increase BP: Patient was min A for transitioning supine to sitting; BP assessed again in sitting and it dropped to 90/60; Patient continues to be lethargic. He was oriented to person, place and situation but not date; This is his baseline;  Educated patient in UE arm pump to increase BP to help improve tolerance with sitting/standing tasks.   PT informed patient's caregiver of his  BP and recommended that patient contact his MD for additional recommendation/check-up;                           PT Education - 05/10/15 1721    Education provided Yes   Education Details positioning, use of UE For increased BP   Person(s) Educated Patient   Methods Explanation;Verbal cues   Comprehension Verbalized understanding;Returned demonstration;Verbal cues required             PT Long Term Goals - 04/16/15 1648    PT LONG TERM GOAL #1   Title Pt will be  independent and compliant with HEP in order to improve strength, transfers, and balance   Time 4   Period Weeks   Status On-going   PT LONG TERM GOAL #2   Title Pt will demonstrate increase in gait speed by at least 0.14 m/s in order to demonstrate clinically significant improvement in speed to improve function at home.   Baseline 02/01/15: 26.8 = 0.37 m/s   Time 4   Period Weeks   Status Partially Met   PT LONG TERM GOAL #3   Title Pt wil decrease TUG to <14 sec  in order to demonstrate improved LE strength and balance in order to decrease falls.    Baseline 02/01/15: 31.48 seconds   Time 4   Period Weeks   Status Partially Met   PT LONG TERM GOAL #4   Title patient will increase Berg Balance Assessment score by >6  points in order to demonstrate improved balance and safety with ADLs by 03/29/14   Time 4   Period Weeks   Status Achieved   PT LONG TERM GOAL #5   Title Patient will increase 6 min walk distance by >150 feet to demonstrate improved functional gait ability with home/community ambulation. by 03/29/14   Time 4   Period Weeks   Status Achieved               Plan - 05/10/15 1722    Clinical Impression Statement Patient very fatigued today; Vitals were monitored and his BP was low. Patient educated on ways to increase BP including lying down with BLE elevated. Also educated patient in UE arm pump to increase BP; additional treatment held due to low BP and symptomatic. Recommend patient follow up with MD. Patient would benefit from additional skilled PT Intervention to improve LE strength, balance and gait safety;    Pt will benefit from skilled therapeutic intervention in order to improve on the following deficits Abnormal gait;Decreased balance;Decreased cognition;Decreased knowledge of precautions;Decreased mobility;Decreased range of motion;Decreased safety awareness;Decreased strength;Difficulty walking;Hypomobility;Impaired tone;Impaired UE functional use   Rehab  Potential Good   Clinical Impairments Affecting Rehab Potential Positive: motivation, family suppport, current progress, Negative: decreased compliance with HEP, RUE/RLE weakness   PT Frequency 2x / week   PT Duration 8 weeks   PT Treatment/Interventions ADLs/Self Care Home Management;Aquatic Therapy;Biofeedback;Electrical Stimulation;Iontophoresis 94m/ml Dexamethasone;Traction;Ultrasound;Contrast Bath;DME Instruction;Gait training;Stair training;Functional mobility training;Therapeutic activities;Therapeutic exercise;Balance training;Neuromuscular re-education;Cognitive remediation;Patient/family education;Orthotic Fit/Training;Manual techniques;Energy conservation   PT Next Visit Plan work on LE strengthening, gait training, and balance;    PT Home Exercise Plan continue as previously given;    Consulted and Agree with Plan of Care Patient;Family member/caregiver   Family Member Consulted Daughter        Problem List Patient Active Problem List   Diagnosis Date Noted  . Tobacco use disorder 12/12/2014  . Hemiparesis ([place] 12/12/2014  .  Basal ganglia hemorrhage ([place]) 12/12/2014  . Chronic pain syndrome 12/04/2014  . Alterations of sensations following CVA (cerebrovascular accident) 07/10/2014  . Acute respiratory failure with hypoxia ([place]) 07/07/2014  . Abnormal urinalysis 07/07/2014  . Hypokalemia 07/07/2014  . Dehydration 07/07/2014  . Metabolic encephalopathy 14/23/9532  . Aortic root dilatation (48 mm May 2016) 07/07/2014  . Ascending aorta dilatation (12m May 2016) 07/07/2014  . ICH (intracerebral hemorrhage) ([place]Gl[place] 07/07/2014  . Nontraumatic thalamic hemorrhage (HBrad[place] 07/06/2014  . Hemiplegia affecting right dominant side ([place] 07/06/2014  . Cognitive deficit due to recent stroke 07/06/2014  . IVH (intraventricular hemorrhage) ([place] 07/06/2014  . Essential hypertension 07/06/2014  . Intracranial hemorrhage ([place]     Michael Goodwin PT, DPT 05/10/2015, 5:23 PM  [place]MAIN [facility]Adv[facility] CenterSERVICES [address] [address]Phone: [phone]  Fax:  [phone] Name: Michael SimonisMRN: [id number] of Birth: [birth year]

## 2015-05-14 ENCOUNTER — Ambulatory Visit (INDEPENDENT_AMBULATORY_CARE_PROVIDER_SITE_OTHER): Payer: Medicare Other | Admitting: Neurology

## 2015-05-14 ENCOUNTER — Ambulatory Visit: Payer: Medicare Other | Admitting: Physical Therapy

## 2015-05-14 ENCOUNTER — Encounter: Payer: Self-pay | Admitting: Neurology

## 2015-05-14 ENCOUNTER — Ambulatory Visit: Payer: Medicare Other | Admitting: Occupational Therapy

## 2015-05-14 VITALS — BP 121/85 | HR 93 | Ht 67.0 in | Wt 124.0 lb

## 2015-05-14 DIAGNOSIS — G8190 Hemiplegia, unspecified affecting unspecified side: Secondary | ICD-10-CM | POA: Diagnosis not present

## 2015-05-14 DIAGNOSIS — R630 Anorexia: Secondary | ICD-10-CM

## 2015-05-14 DIAGNOSIS — I10 Essential (primary) hypertension: Secondary | ICD-10-CM

## 2015-05-14 DIAGNOSIS — F329 Major depressive disorder, single episode, unspecified: Secondary | ICD-10-CM | POA: Diagnosis not present

## 2015-05-14 DIAGNOSIS — I610 Nontraumatic intracerebral hemorrhage in hemisphere, subcortical: Secondary | ICD-10-CM | POA: Diagnosis not present

## 2015-05-14 DIAGNOSIS — F17200 Nicotine dependence, unspecified, uncomplicated: Secondary | ICD-10-CM | POA: Diagnosis not present

## 2015-05-14 DIAGNOSIS — F32A Depression, unspecified: Secondary | ICD-10-CM

## 2015-05-14 MED ORDER — SERTRALINE HCL 50 MG PO TABS: 50.0000 mg | ORAL_TABLET | Freq: Every day | ORAL | Status: AC

## 2015-05-14 MED ORDER — MEGESTROL ACETATE 400 MG/10ML PO SUSP: 800.0000 mg | Freq: Every day | ORAL | Status: AC

## 2015-05-14 NOTE — Patient Instructions (Addendum)
-   continue ASA for stroke prevention - encourage po intake and fluid. May have to think about tube feeding if continue not enough po intake - will prescribe zoloft for depression - will add Megace for appetite stimulation - check BP at home and record - Follow up with your primary care physician for stroke risk factor modification. Recommend maintain blood pressure goal <130/80, diabetes with hemoglobin A1c goal below 6.5% and lipids with LDL cholesterol goal below 70 mg/dL.  - continue aggressive PT/OT and home exercise - quit smoking - follow up in 2 months

## 2015-05-14 NOTE — Progress Notes (Signed)
STROKE NEUROLOGY FOLLOW UP NOTE  NAME: Michael Goodwin DOB: August 23, 1945  REASON FOR VISIT: stroke follow up HISTORY FROM: daughter and pt and chart  Today we had Michael pleasure of seeing Michael Goodwin in follow-up at our Neurology Clinic. Pt was accompanied by daughter and grandson.   History Summary Michael Goodwin is a 70 y.o. male with history of HTN and current smoker was admitted on 07/04/14 due to fall, confusion and right side weakness. CT showed left BG hemorrhage with ventricular extension. Repeat CT showed stable hematoma without hydrocephalus. BP control with medication. Stroke work up including CUS, TTE, LDL and A1C negative. After stabilization, he was discharged to CIR.   09/05/14 follow up (AA) - pt has been doing well with therapy. He can walk across Michael living room with assistance. He lives at home with daughter who provides most information. He always has family around him 24 hours a day. Physical therapy coming to Michael house. Speech is coming. He is eating better now that his diet is not puree. Weakness has improved, he has good and bad days. But they can say significantly improved. Daughter feels he can see more, vision improved. Daughter and family gives his medications. Blood pressure has been excellent in Michael 120s/70s.   12/12/14 follow up - Michael Goodwin has been doing Michael same. Recovery seems at plateau. He has HH twice per week, but after that he did not do much home exercise. He is less motivated as before as per daughter. Denies depression. Still smoking. BP today 144/92 but stated good control at home 120-130. Still has right side weakness, able to walk with semi-walker but need assistance. Right arm not able to function well yet.    Interval History During Michael interval time, Michael pt seems doing worse. As per daughter, he had one day heavy smoking about two months ago and since then he had general decline. Not willing to eat or drink well, BP once down to 70/50. He lost weight  for 13 lbs since then. Pt stated that he had bad taste with food which is Michael reason he dose not want to eat. Daughter giver him ensure bid. Memory getting worse, short tempered and seems depressed as per daughter. BP today 121/85. Has not seen PCP yet.   REVIEW OF SYSTEMS: Full 14 system review of systems performed and notable only for those listed below and in HPI above, all others are negative:  Constitutional:  Activity change, appetite change, fatigue Cardiovascular:  Ear/Nose/Throat:  Trouble swallowing  Skin:  Eyes:   Respiratory:   Gastroitestinal:  Abdominal pain, N/V Genitourinary:  Hematology/Lymphatic:   Endocrine:  Musculoskeletal:  Joint pain, walking difficulty Allergy/Immunology:   Neurological:  Memory loss, weakness Psychiatric: behavior problem, confusion, depression Sleep: daytime sleepiness  Michael following represents Michael Goodwin's updated allergies and side effects list: No Known Allergies  Michael neurologically relevant items on Michael Goodwin's problem list were reviewed on today's visit.  Neurologic Examination  A problem focused neurological exam (12 or more points of Michael single system neurologic examination, vital signs counts as 1 point, cranial nerves count for 8 points) was performed.  Blood pressure 121/85, pulse 93, height  (1.702 m), weight 124 lb (56.246 kg).  General - Well nourished, well developed, in no apparent distress.  Ophthalmologic - Fundi not visualized due to eye movement.  Cardiovascular - Regular rate and rhythm with no murmur.  Mental Status -  Awake alert but not orientated to time, place, and person.  Language test showed paucity of speech with severe dysarthria, able to follow simple commands, naming 2/4, and difficulty with repeating complex sentences.  Cranial Nerves II - XII - II - Visual field intact OU. III, IV, VI - Extraocular movements intact. V - Facial sensation decreased on Michael right, about 20% of left. VII - right  mild facial droop. VIII - Hearing & vestibular intact bilaterally. X - Palate elevates symmetrically. XI - Chin turning & shoulder shrug intact bilaterally. XII - Tongue protrusion intact.  Motor Strength - Michael Goodwin's strength was 3/5 RUE proximal and 4-/5 RLE. LUE and LLE 5/5. Bulk was normal and fasciculations were absent.   Motor Tone - Muscle tone was assessed at Michael neck and appendages and was increased on Michael right.  Reflexes - Michael Goodwin's reflexes were 1+ in all extremities and he had no pathological reflexes.  Sensory - Light touch, temperature/pinprick were assessed and were normal.    Coordination - Michael Goodwin had normal movements in Michael hands, with no ataxia or dysmetria , but slow on Michael right.  Tremor was absent.  Gait and Station - walk with hemi-walker, but significant right spastic hemiparetic gait. Data reviewed: I personally reviewed Michael images and agree with Michael radiology interpretations.  Ct Head Wo Contrast 07/05/2014 Evolving LEFT thalamic 3.3 x 2.3 cm hematoma with intraventricular extension. 4 mm LEFT-to-RIGHT midline shift. Mild hydrocephalus suspected. Moderate white matter changes suggest chronic small vessel ischemic disease.  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   Carotid Doppler Mild to moderate plaque carotid bifurcations bilaterally. ICA velocities and ratios within Michael 1-39% range of stenosis bilaterally. Vertebral arteries are patent and antegrade bilaterally.  2D Echocardiogram  - Left ventricle: Michael cavity size was normal. Wall thickness wasincreased in a pattern of moderate LVH. Michael estimated ejectionfraction was 60%. Wall motion was normal; there were no regionalwall motion abnormalities. - Aortic valve: Sclerosis without stenosis. There was trivialregurgitation. - Aorta:  There is significant dilitation of Michael aortic root andascending aorta. Aortic root dimension: 48 mm (ED). Ascendingaortic diameter: 50 mm (S). This needs to be followed. - Right ventricle: Michael cavity size was normal. Systolic functionwas normal.  EKG normal sinus rhythm. For complete results please see formal report.  Component     Latest Ref Rng 07/05/2014  Cholesterol     0 - 200 mg/dL 295  Triglycerides     <150 mg/dL 62  HDL Cholesterol     >40 mg/dL 95  Total CHOL/HDL Ratio      1.7  VLDL     0 - 40 mg/dL 12  LDL (calc)     0 - 99 mg/dL 55  Hemoglobin A2Z     4.8 - 5.6 % 5.8 (H)  Mean Plasma Glucose      120  TSH     0.350 - 4.500 uIU/mL 1.554    Assessment: As you may recall, he is a 70 y.o. African American male with PMH of HTN and current smoker was admitted on 07/04/14 due to left BG hemorrhage with ventricular extension. Repeat CT showed stable hematoma without hydrocephalus. Stroke work up including CUS, TTE, LDL and A1C negative. After stabilization, he was discharged to CIR. During Michael interval time, BP in good control. Put on ASA for stroke prevention. He had initial muscle strength improvement, but now lack of further improvement due to lack of self exercise. Pt  condition continues to decline, recently not eating or drinking well, memory loss, dysarthric, lack of activity. Will encourage po intake, add antidepressant and increase appetite. Still not quit smoking.  Plan:  - continue ASA for stroke prevention - encourage po intake - prescribe zoloft for depression - add Megace for anorexia - check BP at home and record - Follow up with your primary care physician for stroke risk factor modification. Recommend maintain blood pressure goal <130/80, diabetes with hemoglobin A1c goal below 6.5% and lipids with LDL cholesterol goal below 70 mg/dL.  - continue aggressive PT/OT and home exercise - quit smoking - follow up in 2 months  I spent more than 25 minutes of  face to face time with Michael Goodwin. Greater than 50% of time was spent in counseling and coordination of care. We have discussed about medication compliance, smoking cessation, aggressive home exercise within window.    No orders of Michael defined types were placed in this encounter.    Meds ordered this encounter  Medications  . sertraline (ZOLOFT) 50 MG tablet    Sig: Take 1 tablet (50 mg total) by mouth daily.    Dispense:  30 tablet    Refill:  2  . megestrol (MEGACE) 400 MG/10ML suspension    Sig: Take 20 mLs (800 mg total) by mouth daily.    Dispense:  240 mL    Refill:  2    Goodwin Instructions  - continue ASA for stroke prevention - encourage po intake and fluid. May have to think about tube feeding if continue not enough po intake - will prescribe zoloft for depression - will add Megace for appetite stimulation - check BP at home and record - Follow up with your primary care physician for stroke risk factor modification. Recommend maintain blood pressure goal <130/80, diabetes with hemoglobin A1c goal below 6.5% and lipids with LDL cholesterol goal below 70 mg/dL.  - continue aggressive PT/OT and home exercise - quit smoking - follow up in 2 months    Marvel PlanJindong Yeraldi Fidler, MD PhD [facility] [address], [address] [place], [place] [address] [phone]

## 2015-05-15 ENCOUNTER — Emergency Department: Payer: Medicare Other

## 2015-05-15 ENCOUNTER — Encounter: Payer: Self-pay | Admitting: *Deleted

## 2015-05-15 DIAGNOSIS — Z79899 Other long term (current) drug therapy: Secondary | ICD-10-CM | POA: Insufficient documentation

## 2015-05-15 DIAGNOSIS — J9601 Acute respiratory failure with hypoxia: Secondary | ICD-10-CM | POA: Insufficient documentation

## 2015-05-15 DIAGNOSIS — G9341 Metabolic encephalopathy: Secondary | ICD-10-CM | POA: Diagnosis not present

## 2015-05-15 DIAGNOSIS — Z8673 Personal history of transient ischemic attack (TIA), and cerebral infarction without residual deficits: Secondary | ICD-10-CM | POA: Diagnosis not present

## 2015-05-15 DIAGNOSIS — R627 Adult failure to thrive: Secondary | ICD-10-CM | POA: Diagnosis not present

## 2015-05-15 DIAGNOSIS — Z7982 Long term (current) use of aspirin: Secondary | ICD-10-CM | POA: Insufficient documentation

## 2015-05-15 DIAGNOSIS — F17210 Nicotine dependence, cigarettes, uncomplicated: Secondary | ICD-10-CM | POA: Diagnosis not present

## 2015-05-15 DIAGNOSIS — N390 Urinary tract infection, site not specified: Secondary | ICD-10-CM | POA: Diagnosis not present

## 2015-05-15 DIAGNOSIS — I10 Essential (primary) hypertension: Secondary | ICD-10-CM | POA: Diagnosis not present

## 2015-05-15 DIAGNOSIS — R531 Weakness: Secondary | ICD-10-CM | POA: Diagnosis not present

## 2015-05-15 DIAGNOSIS — G894 Chronic pain syndrome: Secondary | ICD-10-CM | POA: Diagnosis not present

## 2015-05-15 LAB — CBC
HEMATOCRIT: 37.2 % — AB (ref 40.0–52.0)
HEMOGLOBIN: 12.3 g/dL — AB (ref 13.0–18.0)
MCH: 29.2 pg (ref 26.0–34.0)
MCHC: 33.1 g/dL (ref 32.0–36.0)
MCV: 88.4 fL (ref 80.0–100.0)
Platelets: 206 10*3/uL (ref 150–440)
RBC: 4.21 MIL/uL — ABNORMAL LOW (ref 4.40–5.90)
RDW: 15.1 % — AB (ref 11.5–14.5)
WBC: 7.5 10*3/uL (ref 3.8–10.6)

## 2015-05-15 LAB — BASIC METABOLIC PANEL
ANION GAP: 8 (ref 5–15)
BUN: 15 mg/dL (ref 6–20)
CHLORIDE: 98 mmol/L — AB (ref 101–111)
CO2: 28 mmol/L (ref 22–32)
Calcium: 10.2 mg/dL (ref 8.9–10.3)
Creatinine, Ser: 1.06 mg/dL (ref 0.61–1.24)
GFR calc Af Amer: >60 mL/min (ref >60)
GLUCOSE: 142 mg/dL — AB (ref 65–99)
POTASSIUM: 4.5 mmol/L (ref 3.5–5.1)
Sodium: 134 mmol/L — ABNORMAL LOW (ref 135–145)

## 2015-05-15 LAB — TROPONIN I: Troponin I: <0.03 ng/mL (ref <0.031)

## 2015-05-15 NOTE — ED Notes (Signed)
Pt to triage via wheelchair.  Pt brought in via ems from home with diarrhea and feeling weak.  Denies abd pain.  Pt reports decreased appetite and weight loss recently.  Pt alert.  Hx cva.

## 2015-05-15 NOTE — ED Notes (Signed)
Per EMS report, patient's family reports increased weakness and poor PO intake. Family reports a 15lb weight loss over the last 30 days. Per EMS, VSS and blood sugar was 86.

## 2015-05-16 ENCOUNTER — Ambulatory Visit: Payer: Medicare Other | Admitting: Occupational Therapy

## 2015-05-16 ENCOUNTER — Ambulatory Visit: Payer: Medicare Other | Admitting: Physical Therapy

## 2015-05-16 ENCOUNTER — Emergency Department
Admission: EM | Admit: 2015-05-16 | Discharge: 2015-05-16 | Disposition: A | Discharge status: Home / Self Care | Payer: Medicare Other | Attending: Emergency Medicine | Admitting: Emergency Medicine

## 2015-05-16 DIAGNOSIS — N390 Urinary tract infection, site not specified: Secondary | ICD-10-CM

## 2015-05-16 DIAGNOSIS — R531 Weakness: Secondary | ICD-10-CM

## 2015-05-16 DIAGNOSIS — R627 Adult failure to thrive: Secondary | ICD-10-CM

## 2015-05-16 LAB — URINALYSIS COMPLETE WITH MICROSCOPIC (ARMC ONLY)
BILIRUBIN URINE: NEGATIVE
Glucose, UA: 50 mg/dL — AB
Hgb urine dipstick: NEGATIVE
Leukocytes, UA: NEGATIVE
Nitrite: NEGATIVE
PH: 5 (ref 5.0–8.0)
PROTEIN: 100 mg/dL — AB
Specific Gravity, Urine: 1.031 — ABNORMAL HIGH (ref 1.005–1.030)

## 2015-05-16 MED ORDER — SODIUM CHLORIDE 0.9 % IV BOLUS (SEPSIS)
500.0000 mL | Freq: Once | INTRAVENOUS | Status: AC
  Administered 2015-05-16: 500 mL via INTRAVENOUS

## 2015-05-16 MED ORDER — CIPROFLOXACIN HCL 500 MG PO TABS
500.0000 mg | ORAL_TABLET | Freq: Once | ORAL | Status: AC
  Administered 2015-05-16: 500 mg via ORAL
  Filled 2015-05-16: qty 1

## 2015-05-16 MED ORDER — ENSURE ENLIVE PO LIQD: 237.0000 mL | Freq: Two times a day (BID) | ORAL | Status: DC

## 2015-05-16 MED ORDER — CIPROFLOXACIN HCL 500 MG PO TABS
500.0000 mg | ORAL_TABLET | Freq: Two times a day (BID) | ORAL | Status: DC
  Administered 2015-05-16: 500 mg via ORAL
  Filled 2015-05-16: qty 1

## 2015-05-16 NOTE — Care Management Note (Signed)
Case Management Note  Patient Details  Name: Michael Goodwin MRN: [id number] Date of Birth: Dec 28, 1945  Subjective/Objective:   Spoke to patient , who is alert, and states he's just a little wore out. Got permission to call daughter Michael Goodwin at [phone], as well as the PCP. Patient has not seen the MD since august of last year.  Daughter says the patient got op rehab until march, and her main concern is that the patient does not want to eat or drink. Explained to her the patient has no need for hospital admit, but we can set up home health and she is agreeable to this. She also says that she is on her way right now to do something with patient's Medicaid application, which she says is approved ,but is only partial benefit, no longterm yet. MD made aware.                 Action/Plan:   Expected Discharge Date:                  Expected Discharge Plan:     In-House Referral:     Discharge planning Services     Post Acute Care Choice:    Choice offered to:     DME Arranged:    DME Agency:     HH Arranged:    HH Agency:     Status of Service:     Medicare Important Message Given:    Date Medicare IM Given:    Medicare IM give by:    Date Additional Medicare IM Given:    Additional Medicare Important Message give by:     If discussed at Long Length of Stay Meetings, dates discussed:    Additional Comments:  Michael BueCheryl Teiara Baria, RN 05/16/2015, 8:41 AM

## 2015-05-16 NOTE — ED Notes (Signed)
Pt given ensure 

## 2015-05-16 NOTE — ED Notes (Signed)
Pt in bed with eyes closed. RR even and unlabored. VSS

## 2015-05-16 NOTE — ED Notes (Signed)
Pt in bed with eyes closed. No needs identified at this time

## 2015-05-16 NOTE — ED Notes (Signed)
This RN attempted to walk pt. pt very unsteady, assisted back into bed and EDP made aware

## 2015-05-16 NOTE — ED Provider Notes (Signed)
Progress note  10:08 AM Patient was evaluated by ED care management and it was decided that the daughter would take the patient home and we will do a home health referral with social services, PT and OT if needed. The daughter is going to follow up with primary care M.D. for any further instructions and treatment. The daughter was told to return immediately if condition worsens or if patient medically becomes unstable.  Michael CarryLinda M Leiliana Foody, MD 05/16/15 1009

## 2015-05-16 NOTE — ED Notes (Signed)
AAOx3.  Skin warm and dry.  NAD 

## 2015-05-16 NOTE — ED Notes (Signed)
Dietary called for ensure supplements

## 2015-05-16 NOTE — Care Management Note (Signed)
Case Management Note  Patient Details  Name: Michael Goodwin MRN: [id number] Date of Birth: November 29, 1945  Subjective/Objective:    [phone] and made referral for Home PT on the patient. Provided her with demographics and contact info for the daughter also. Dr Ewell PoeAnderson's office notified of referral.                Action/Plan:   Expected Discharge Date:                  Expected Discharge Plan:     In-House Referral:     Discharge planning Services     Post Acute Care Choice:    Choice offered to:     DME Arranged:    DME Agency:     HH Arranged:    HH Agency:     Status of Service:     Medicare Important Message Given:    Date Medicare IM Given:    Medicare IM give by:    Date Additional Medicare IM Given:    Additional Medicare Important Message give by:     If discussed at Long Length of Stay Meetings, dates discussed:    Additional Comments:  Michael BueCheryl Crislyn Willbanks, RN 05/16/2015, 8:57 AM

## 2015-05-16 NOTE — Care Management Note (Signed)
Case Management Note  Patient Details  Name: Marguarite ArbourClyde Eble MRN: [id number] Date of Birth: 29-Sep-1945  Subjective/Objective:   Spoke with patient daughter Orpha Burkaty, who appears very pleasant. [facility] printout. She is happy at this time and has no questions. Patient will be going home with her, and her address is on all discharge info.                 Action/Plan:   Expected Discharge Date:                  Expected Discharge Plan:     In-House Referral:     Discharge planning Services     Post Acute Care Choice:    Choice offered to:     DME Arranged:    DME Agency:     HH Arranged:    HH Agency:     Status of Service:     Medicare Important Message Given:    Date Medicare IM Given:    Medicare IM give by:    Date Additional Medicare IM Given:    Additional Medicare Important Message give by:     If discussed at Long Length of Stay Meetings, dates discussed:    Additional Comments:  Berna BueCheryl Nicholette Dolson, RN 05/16/2015, 10:12 AM

## 2015-05-16 NOTE — ED Provider Notes (Addendum)
[facility] Emergency Department Provider Note  ____________________________________________  Time seen: Approximately 12:58 AM  I have reviewed the triage vital signs and the nursing notes.   HISTORY  Chief Complaint Weakness  History limited secondary to patient being a vague historian  HPI Michael Goodwin is a 70 y.o. male brought to the ED from home via EMS with a chief complaint of generalized weakness. Per EMS report, patient's family reported increased weakness and poor appetite along with diarrhea recently. They also noted a 15 pound unintentional weight loss over the past month. Patient's just states nothing tastes good to him as far as food or drink. Currently denies abdominal pain, nausea, vomiting or diarrhea. Denies recent fever, chills, chest pain, shortness of breath, dysuria, testicular pain or swelling. Nothing makes his symptoms better or worse. Usually ambulates with a cane.   Past Medical History  Diagnosis Date  . Stroke (HCC)   . Essential hypertension 07/06/2014  . 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 Hemiplegia affecting right dominant side (HCC) 07/06/2014    Patient Active Problem List   Diagnosis Date Noted  . Depression 05/14/2015  . Anorexia 05/14/2015  . Tobacco use disorder 12/12/2014  . Hemiparesis (HCC) 12/12/2014  . 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 Chronic pain syndrome 12/04/2014  . Alterations of sensations following CVA (cerebrovascular accident) 07/10/2014  . Acute respiratory failure with hypoxia (HCC) 07/07/2014  . Abnormal urinalysis 07/07/2014  . Hypokalemia 07/07/2014  . Dehydration 07/07/2014  . Metabolic encephalopathy 07/07/2014  . Aortic root dilatation (48 mm May 2016) 07/07/2014  . 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 ICH (intracerebral hemorrhage) (HCC) 07/07/2014  . 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 Hemiplegia affecting right dominant  side (HCC) 07/06/2014  . Cognitive deficit due to recent stroke 07/06/2014  . IVH (intraventricular hemorrhage) (HCC) 07/06/2014  . Essential hypertension 07/06/2014  . Intracranial hemorrhage [facility])     Past Surgical History  Procedure Laterality Date  . No past surgeries      Current Outpatient Rx  Name  Route  Sig  Dispense  Refill  . amLODipine (NORVASC) 5 MG tablet   Oral   Take 1 tablet (5 mg total) by mouth daily.   30 tablet   1   . aspirin EC 81 MG tablet   Oral   Take by mouth.         . benzonatate (TESSALON) 100 MG capsule   Oral   Take 200 mg by mouth 3 (three) times daily as needed for cough.         . gabapentin (NEURONTIN) 100 MG capsule   Oral   Take 100 mg by mouth 3 (three) times daily.         Marland Kitchen lisinopril (PRINIVIL,ZESTRIL) 10 MG tablet   Oral   Take 1 tablet (10 mg total) by mouth daily.   30 tablet   1   . loratadine (CLARITIN) 10 MG tablet   Oral   Take 10 mg by mouth daily.         . megestrol (MEGACE) 400 MG/10ML suspension   Oral   Take 20 mLs (800 mg total) by mouth daily.   240 mL   2   . methylphenidate (RITALIN) 5 MG tablet   Oral   Take 1 tablet (5 mg total) by mouth 2 (two) times daily with breakfast and lunch.   60 tablet   0   . mirtazapine (REMERON) 15 MG tablet  Oral   Take 1 tablet (15 mg total) by mouth at bedtime.   30 tablet   1   . sertraline (ZOLOFT) 50 MG tablet   Oral   Take 1 tablet (50 mg total) by mouth daily.   30 tablet   2   . tamsulosin (FLOMAX) 0.4 MG CAPS capsule   Oral   Take 1 capsule (0.4 mg total) by mouth daily.   30 capsule   6     Allergies Review of patient's allergies indicates no known allergies.  Family History  Problem Relation Age of Onset  . Diabetes    . Stroke Brother     Social History Social History  Substance Use Topics  . Smoking status: Current Every Day Smoker -- 0.50 packs/day for 55 years    Types: Cigarettes  . Smokeless tobacco: None      Comment: 05/14/15 4 cigs a day per daughter  . Alcohol Use: No    Review of Systems  Constitutional: Positive for generalized weakness. No fever/chills. Eyes: No visual changes. ENT: No sore throat. Cardiovascular: Denies chest pain. Respiratory: Denies shortness of breath. Gastrointestinal: No abdominal pain.  No nausea, no vomiting.  Positive for diarrhea.  No constipation. Genitourinary: Negative for dysuria. Musculoskeletal: Negative for back pain. Skin: Negative for rash. Neurological: Negative for headaches, focal weakness or numbness.  10-point ROS otherwise negative.  ____________________________________________   PHYSICAL EXAM:  VITAL SIGNS: ED Triage Vitals  Enc Vitals Group     BP 05/15/15 2036 96/71 mmHg     Pulse Rate 05/15/15 2036 111     Resp 05/15/15 2036 20     Temp 05/15/15 2036 97.5 F (36.4 C)     Temp Source 05/15/15 2036 Oral     SpO2 05/15/15 2036 98 %     Weight 05/15/15 2036 130 lb (58.968 kg)     Height 05/15/15 2036 5\' 5"  (1.651 m)     Head Cir --      Peak Flow --      Pain Score 05/16/15 0035 2     Pain Loc --      Pain Edu? --      Excl. in GC? --     Constitutional: Alert and oriented. Thin appearing and in no acute distress. Eyes: Conjunctivae are normal. PERRL. EOMI. Head: Atraumatic. Nose: No congestion/rhinnorhea. Mouth/Throat: Mucous membranes are moist.  Oropharynx non-erythematous. Neck: No stridor.   Cardiovascular: Normal rate, regular rhythm. Grossly normal heart sounds.  Good peripheral circulation. Respiratory: Normal respiratory effort.  No retractions. Lungs CTAB. Gastrointestinal: Soft and nontender. No distention. No abdominal bruits. No CVA tenderness. Musculoskeletal: No lower extremity tenderness nor edema.  No joint effusions. Neurologic:  Baseline slurred speech and language. No gross focal neurologic deficits are appreciated.  Skin:  Skin is warm, dry and intact. No rash noted. Psychiatric: Mood and affect are  normal. Speech and behavior are normal.  ____________________________________________   LABS (all labs ordered are listed, but only abnormal results are displayed)  Labs Reviewed  BASIC METABOLIC PANEL - Abnormal; Notable for the following:    Sodium 134 (*)    Chloride 98 (*)    Glucose, Bld 142 (*)    All other components within normal limits  CBC - Abnormal; Notable for the following:    RBC 4.21 (*)    Hemoglobin 12.3 (*)    HCT 37.2 (*)    RDW 15.1 (*)    All other components within normal limits  URINALYSIS COMPLETEWITH MICROSCOPIC ([facility] ONLY) - Abnormal; Notable for the following:    Color, Urine AMBER (*)    APPearance CLOUDY (*)    Glucose, UA 50 (*)    Ketones, ur TRACE (*)    Specific Gravity, Urine 1.031 (*)    Protein, ur 100 (*)    Bacteria, UA RARE (*)    Squamous Epithelial / LPF 0-5 (*)    All other components within normal limits  URINE CULTURE  TROPONIN I   ____________________________________________  EKG  ED ECG REPORT I, Shawn Dannenberg J, the attending physician, personally viewed and interpreted this ECG.   Date: 05/16/2015  EKG Time: 2041  Rate: 106  Rhythm: sinus tachycardia  Axis: Normal  Intervals:none  ST&T Change: Nonspecific  ____________________________________________  RADIOLOGY  Chest 2 view (viewed by me, interpreted per Dr. Clovis Riley): No active cardiopulmonary disease ____________________________________________   PROCEDURES  Procedure(s) performed: None  Critical Care performed: No  ____________________________________________   INITIAL IMPRESSION / ASSESSMENT AND PLAN / ED COURSE  Pertinent labs & imaging results that were available during my care of the patient were reviewed by me and considered in my medical decision making (see chart for details).  70 year old male who presents with generalized weakness and diarrhea. Initial lab work including troponin are unremarkable. Awaiting urinalysis. Will obtain  orthostatic vital signs, infuse IV fluid bolus and reassess.  ----------------------------------------- 2:54 AM on 05/16/2015 -----------------------------------------  Cipro ordered for 6-30 WBCs noted in catheterized urinalysis specimen. Patient ambulated with cane with very unsteady gait short distance in the room. He would be unsafe to discharge home. Patient also does not meet inpatient admission criteria. Will hold in the ED pending social work and physical therapy consults for placement. He will be placed in an inpatient bed for his comfort while awaiting the above evaluations.  ----------------------------------------- 7:01 AM on 05/16/2015 -----------------------------------------  Patient sleeping in no acute distress. Plan remains for social work and physical therapy consult this morning. Care transferred to Dr. Ladona Ridgel. ____________________________________________   FINAL CLINICAL IMPRESSION(S) / ED DIAGNOSES  Final diagnoses:  Weakness  Failure to thrive in adult  UTI (lower urinary tract infection)      Irean Hong, MD 05/16/15 0454  Irean Hong, MD 05/16/15 [phone]

## 2015-05-17 LAB — URINE CULTURE: SPECIAL REQUESTS: NORMAL

## 2015-05-17 NOTE — Addendum Note (Signed)
Addended by: Avon GullyJAGENTENFL, Deveion Denz M on: 05/17/2015 05:19 PM   Modules accepted: Orders

## 2015-05-29 ENCOUNTER — Emergency Department
Admission: EM | Admit: 2015-05-29 | Discharge: 2015-05-29 | Disposition: A | Discharge status: Home / Self Care | Payer: Medicare Other | Attending: Emergency Medicine | Admitting: Emergency Medicine

## 2015-05-29 ENCOUNTER — Encounter: Payer: Self-pay | Admitting: Emergency Medicine

## 2015-05-29 DIAGNOSIS — N179 Acute kidney failure, unspecified: Secondary | ICD-10-CM | POA: Insufficient documentation

## 2015-05-29 DIAGNOSIS — Z7982 Long term (current) use of aspirin: Secondary | ICD-10-CM | POA: Diagnosis not present

## 2015-05-29 DIAGNOSIS — F17210 Nicotine dependence, cigarettes, uncomplicated: Secondary | ICD-10-CM | POA: Diagnosis not present

## 2015-05-29 DIAGNOSIS — R531 Weakness: Secondary | ICD-10-CM | POA: Diagnosis present

## 2015-05-29 DIAGNOSIS — Z8673 Personal history of transient ischemic attack (TIA), and cerebral infarction without residual deficits: Secondary | ICD-10-CM | POA: Insufficient documentation

## 2015-05-29 DIAGNOSIS — I10 Essential (primary) hypertension: Secondary | ICD-10-CM | POA: Diagnosis not present

## 2015-05-29 DIAGNOSIS — R627 Adult failure to thrive: Secondary | ICD-10-CM | POA: Diagnosis not present

## 2015-05-29 DIAGNOSIS — F329 Major depressive disorder, single episode, unspecified: Secondary | ICD-10-CM | POA: Diagnosis not present

## 2015-05-29 DIAGNOSIS — Z79899 Other long term (current) drug therapy: Secondary | ICD-10-CM | POA: Insufficient documentation

## 2015-05-29 LAB — CBC WITH DIFFERENTIAL/PLATELET
BASOS ABS: 0 10*3/uL (ref 0–0.1)
Basophils Relative: 0 %
EOS ABS: 0 10*3/uL (ref 0–0.7)
EOS PCT: 1 %
HCT: 35.4 % — ABNORMAL LOW (ref 40.0–52.0)
Hemoglobin: 11.8 g/dL — ABNORMAL LOW (ref 13.0–18.0)
Lymphocytes Relative: 16 %
Lymphs Abs: 0.8 10*3/uL — ABNORMAL LOW (ref 1.0–3.6)
MCH: 29.2 pg (ref 26.0–34.0)
MCHC: 33.3 g/dL (ref 32.0–36.0)
MCV: 87.7 fL (ref 80.0–100.0)
Monocytes Absolute: 0.4 10*3/uL (ref 0.2–1.0)
Monocytes Relative: 8 %
Neutro Abs: 3.6 10*3/uL (ref 1.4–6.5)
Neutrophils Relative %: 75 %
PLATELETS: 368 10*3/uL (ref 150–440)
RBC: 4.04 MIL/uL — AB (ref 4.40–5.90)
RDW: 14.4 % (ref 11.5–14.5)
WBC: 4.7 10*3/uL (ref 3.8–10.6)

## 2015-05-29 LAB — URINALYSIS COMPLETE WITH MICROSCOPIC (ARMC ONLY)
Bilirubin Urine: NEGATIVE
GLUCOSE, UA: NEGATIVE mg/dL
Hgb urine dipstick: NEGATIVE
LEUKOCYTES UA: NEGATIVE
Nitrite: NEGATIVE
Protein, ur: 30 mg/dL — AB
SPECIFIC GRAVITY, URINE: 1.027 (ref 1.005–1.030)
pH: 5 (ref 5.0–8.0)

## 2015-05-29 LAB — BASIC METABOLIC PANEL
Anion gap: 12 (ref 5–15)
BUN: 37 mg/dL — ABNORMAL HIGH (ref 6–20)
CALCIUM: 10.1 mg/dL (ref 8.9–10.3)
CHLORIDE: 103 mmol/L (ref 101–111)
CO2: 22 mmol/L (ref 22–32)
CREATININE: 1.29 mg/dL — AB (ref 0.61–1.24)
GFR calc non Af Amer: 55 mL/min — ABNORMAL LOW (ref >60)
Glucose, Bld: 90 mg/dL (ref 65–99)
Potassium: 4.2 mmol/L (ref 3.5–5.1)
SODIUM: 137 mmol/L (ref 135–145)

## 2015-05-29 LAB — TROPONIN I

## 2015-05-29 MED ORDER — SODIUM CHLORIDE 0.9 % IV BOLUS (SEPSIS)
1000.0000 mL | Freq: Once | INTRAVENOUS | Status: AC
  Administered 2015-05-29: 1000 mL via INTRAVENOUS

## 2015-05-29 NOTE — Discharge Instructions (Signed)
You were evaluated for decreased intake and found have mild kidney failure from dehydration and you were given IV fluids in the emergency department.  We discussed, the next step for possible discussion about whether or not to place a feeding tube would be to get back with the primary care physician, and see gastroenterology consultation. You are provided the office number for gastroenterology, Dr. Shelle Iron.   Return to the emergency department for any new or worsening condition including any chest or abdominal pain, not making urine, confusion altered mental status, weakness or numbness, dizziness or passing out, or any other symptoms concerning to you.   Acute Kidney Injury Acute kidney injury is any condition in which there is sudden (acute) damage to the kidneys. Acute kidney injury was previously known as acute kidney failure or acute renal failure. The kidneys are two organs that lie on either side of the spine between the middle of the back and the front of the abdomen. The kidneys:  Remove wastes and extra water from the blood.   Produce important hormones. These help keep bones strong, regulate blood pressure, and help create red blood cells.   Balance the fluids and chemicals in the blood and tissues. A small amount of kidney damage may not cause problems, but a large amount of damage may make it difficult or impossible for the kidneys to work the way they should. Acute kidney injury may develop into long-lasting (chronic) kidney disease. It may also develop into a life-threatening disease called end-stage kidney disease. Acute kidney injury can get worse very quickly, so it should be treated right away. Early treatment may prevent other kidney diseases from developing. CAUSES   A problem with blood flow to the kidneys. This may be caused by:   Blood loss.   Heart disease.   Severe burns.   Liver disease.  Direct damage to the kidneys. This may be caused by:  Some medicines.    A kidney infection.   Poisoning or consuming toxic substances.   A surgical wound.   A blow to the kidney area.   A problem with urine flow. This may be caused by:   Cancer.   Kidney stones.   An enlarged prostate. SIGNS AND SYMPTOMS   Swelling (edema) of the legs, ankles, or feet.   Tiredness (lethargy).   Nausea or vomiting.   Confusion.   Problems with urination, such as:   Painful or burning feeling during urination.   Decreased urine production.   Frequent accidents in children who are potty trained.   Bloody urine.   Muscle twitches and cramps.   Shortness of breath.   Seizures.   Chest pain or pressure. Sometimes, no symptoms are present. DIAGNOSIS Acute kidney injury may be detected and diagnosed by tests, including blood, urine, imaging, or kidney biopsy tests.  TREATMENT Treatment of acute kidney injury varies depending on the cause and severity of the kidney damage. In mild cases, no treatment may be needed. The kidneys may heal on their own. If acute kidney injury is more severe, your health care provider will treat the cause of the kidney damage, help the kidneys heal, and prevent complications from occurring. Severe cases may require a procedure to remove toxic wastes from the body (dialysis) or surgery to repair kidney damage. Surgery may involve:   Repair of a torn kidney.   Removal of an obstruction. HOME CARE INSTRUCTIONS  Follow your prescribed diet.  Take medicines only as directed by your health care provider.  Do not take any new medicines (prescription, over-the-counter, or nutritional supplements) unless approved by your health care provider. Many medicines can worsen your kidney damage or may need to have the dose adjusted.   Keep all follow-up visits as directed by your health care provider. This is important.  Observe your condition to make sure you are healing as expected. SEEK IMMEDIATE MEDICAL CARE  IF:  You are feeling ill or have severe pain in the back or side.   Your symptoms return or you have new symptoms.  You have any symptoms of end-stage kidney disease. These include:   Persistent itchiness.   Loss of appetite.   Headaches.   Abnormally dark or light skin.  Numbness in the hands or feet.   Easy bruising.   Frequent hiccups.   Menstruation stops.   You have a fever.  You have increased urine production.  You have pain or bleeding when urinating. MAKE SURE YOU:   Understand these instructions.  Will watch your condition.  Will get help right away if you are not doing well or get worse.   This information is not intended to replace advice given to you by your health care provider. Make sure you discuss any questions you have with your health care provider.   Document Released: 08/12/2010 Document Revised: 02/17/2014 Document Reviewed: 09/26/2011 Elsevier Interactive Patient Education 2016 [employer]. Failure to Thrive, Adult Failure to thrive is a group of symptoms that affect elderly adults. These symptoms include loss of appetite and weight loss. People who have this condition may do fewer and fewer activities over time. They may lose interest in being with friends or they may not want to eat or drink. This condition is not a normal part of aging. CAUSES This condition may be caused by:  A disease, such dementia, diabetes, cancer, or lung disease.  A health problem, such as a vitamin deficiency or a heart problem.  A disorder, such as depression.  A disability.  Medicines.  Mistreatment or neglect. In some cases, the cause may not be known. SYMPTOMS Symptoms of this condition include:  Loss of more than 5% of your body weight.  Being more tired than normal after an activity.  Having trouble getting up after sitting.  Loss of appetite.  Not getting out of bed.  Not wanting to do usual activities.  Depression.  Getting  infections often.  Bedsores.  Taking a long time to recover after an injury or a surgery.  Weakness. DIAGNOSIS This condition may be diagnosed with a physical exam. Your health care provider will ask questions about your health, behavior, and mood, such as:  Has your activity changed?  Do you seem sad?  Are your eating habits different? Tests may also be done. They may include:  Blood tests.  Urine tests.  Imaging tests, such as X-rays, a CT scan, or MRI.  Hearing tests.  Vision tests.  Tests to check thinking ability (cognitive tests).  Activity tests to see if you can do tasks such as bathing and dressing and to see if you can move around safely. You may be referred to a specialist. TREATMENT Treatment for this condition depends on the cause. It may involve:  Treating the cause.  Talk therapy or medicine to treat depression.  Improving diet, such as by eating more often or taking nutritional supplements.  Changing or stopping a medicine.  Physical therapy. It often takes a team of health care providers to find the right treatment. HOME  CARE INSTRUCTIONS  Take over-the-counter and prescription medicines only as told by your health care provider.  Eat a healthy, well-balanced diet. Make sure to get enough calories in each meal.  Be physically active. Include strength training as part of your exercise routine. A physical therapist can help to set up an exercise program that fits you.  Make sure that you are safe at home.  Make sure that you have a plan for what to do if you become unable to make decisions for yourself. SEEK MEDICAL CARE IF:  You are not able to eat well.  You are not able to move around.  You feel very sad or hopeless. SEEK IMMEDIATE MEDICAL CARE IF:  You have thoughts of ending your life.  You cannot eat or drink.  You do not get out of bed.  Staying at home is no longer safe.  You have a fever.   This information is not  intended to replace advice given to you by your health care provider. Make sure you discuss any questions you have with your health care provider.   Document Released: 04/21/2011 Document Revised: 10/18/2014 Document Reviewed: 04/24/2014 Elsevier Interactive Patient Education [employer]2016 [employer]

## 2015-05-29 NOTE — ED Provider Notes (Signed)
[facility] Emergency Department Provider Note   ____________________________________________  Time seen: Approximately 12 PM I have reviewed the triage vital signs and the triage nursing note.  HISTORY  Chief Complaint Weakness   Historian Patient, limited historian Additional history per the daughter  HPI Michael Goodwin is a 70 y.o. male who it sounds like has essentially been failure to thrive, decreased by mouth intake over several weeks, reports this is continued over the last couple days. He states he just doesn't feel like eating and things don't taste right. Does not report vomiting or diarrhea. No abdominal pain. No chest pain. No coughing or trouble breathing. No fevers. No headache. Symptoms are moderate.  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 He was evaluated for what sounds like failure to thrive a couple weeks ago. It sounds like symptoms have continued since then.  Daughter states that 4-6 weeks ago his family member brought him several packs of cigarettes that he smoked within a day or so and after that it seemed like he was "sick" and had nausea and poor by mouth intake. He doesn't seem to be complaining of nausea or abdominal pain now, just bad taste in his mouth that makes him not want to eat.    Past Medical History  Diagnosis Date  . Stroke (HCC)   . Essential hypertension 07/06/2014  . 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 Hemiplegia affecting right dominant side (HCC) 07/06/2014    Patient Active Problem List   Diagnosis Date Noted  . Depression 05/14/2015  . Anorexia 05/14/2015  . Tobacco use disorder 12/12/2014  . Hemiparesis (HCC) 12/12/2014  . 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 Chronic pain syndrome 12/04/2014  . Alterations of sensations following CVA (cerebrovascular accident) 07/10/2014  . Acute respiratory failure with hypoxia (HCC) 07/07/2014  . Abnormal urinalysis  07/07/2014  . Hypokalemia 07/07/2014  . Dehydration 07/07/2014  . Metabolic encephalopathy 07/07/2014  . Aortic root dilatation (48 mm May 2016) 07/07/2014  . 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 ICH (intracerebral hemorrhage) (HCC) 07/07/2014  . 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 Hemiplegia affecting right dominant side (HCC) 07/06/2014  . Cognitive deficit due to recent stroke 07/06/2014  . IVH (intraventricular hemorrhage) (HCC) 07/06/2014  . Essential hypertension 07/06/2014  . Intracranial hemorrhage [facility])     Past Surgical History  Procedure Laterality Date  . No past surgeries      Current Outpatient Rx  Name  Route  Sig  Dispense  Refill  . amLODipine (NORVASC) 5 MG tablet   Oral   Take 1 tablet (5 mg total) by mouth daily.   30 tablet   1   . aspirin EC 81 MG tablet   Oral   Take 81 mg by mouth daily.          . ciprofloxacin (CIPRO) 500 MG tablet   Oral   Take 500 mg by mouth 2 (two) times daily. For 10 days         . gabapentin (NEURONTIN) 100 MG capsule   Oral   Take 100 mg by mouth 2 (two) times daily.          Marland Kitchen lisinopril (PRINIVIL,ZESTRIL) 10 MG tablet   Oral   Take 1 tablet (10 mg total) by mouth daily.   30 tablet   1   . loratadine (CLARITIN) 10 MG tablet   Oral   Take 10 mg by mouth  daily.         . megestrol (MEGACE) 400 MG/10ML suspension   Oral   Take 20 mLs (800 mg total) by mouth daily.   240 mL   2   . metroNIDAZOLE (FLAGYL) 250 MG tablet   Oral   Take 250 mg by mouth 3 (three) times daily. For 10 days         . mirtazapine (REMERON) 15 MG tablet   Oral   Take 1 tablet (15 mg total) by mouth at bedtime.   30 tablet   1   . ondansetron (ZOFRAN-ODT) 4 MG disintegrating tablet   Oral   Take 4 mg by mouth every 8 (eight) hours as needed for nausea or vomiting.         . sertraline (ZOLOFT) 50 MG tablet   Oral   Take 1 tablet (50 mg total) by mouth daily.   30 tablet   2    . tamsulosin (FLOMAX) 0.4 MG CAPS capsule   Oral   Take 1 capsule (0.4 mg total) by mouth daily.   30 capsule   6     Allergies Review of patient's allergies indicates no known allergies.  Family History  Problem Relation Age of Onset  . Diabetes    . Stroke Brother     Social History Social History  Substance Use Topics  . Smoking status: Current Every Day Smoker -- 0.50 packs/day for 55 years    Types: Cigarettes  . Smokeless tobacco: None     Comment: 05/14/15 4 cigs a day per daughter  . Alcohol Use: No    Review of Systems  Constitutional: Negative for fever. Eyes: Negative for visual changes. ENT: Negative for sore throat. Cardiovascular: Negative for chest pain. Respiratory: Negative for shortness of breath. Gastrointestinal: Negative for abdominal pain, vomiting and diarrhea. Denies black or bloody stools. Never had a colonoscopy before. Genitourinary: Negative for dysuria. Musculoskeletal: Negative for back pain. Skin: Negative for rash. Neurological: Negative for headache. 10 point Review of Systems otherwise negative ____________________________________________   PHYSICAL EXAM:  VITAL SIGNS: ED Triage Vitals  Enc Vitals Group     BP 05/29/15 0947 102/87 mmHg     Pulse Rate 05/29/15 0947 107     Resp 05/29/15 0947 18     Temp 05/29/15 0947 97.6 F (36.4 C)     Temp Source 05/29/15 0947 Oral     SpO2 05/29/15 0947 97 %     Weight 05/29/15 0947 120 lb (54.432 kg)     Height 05/29/15 0947 5\' 9"  (1.753 m)     Head Cir --      Peak Flow --      Pain Score 05/29/15 0948 8     Pain Loc --      Pain Edu? --      Excl. in GC? --      Constitutional: Alert and oriented. Well appearing and in no distress. HEENT   Head: Normocephalic and atraumatic.      Eyes: Conjunctivae are normal. PERRL. Normal extraocular movements.      Ears:         Nose: No congestion/rhinnorhea.   Mouth/Throat: Mucous membranes are moist.  Poor dentition, missing  teeth   Neck: No stridor. Cardiovascular/Chest: Normal rate, regular rhythm.  No murmurs, rubs, or gallops. Respiratory: Normal respiratory effort without tachypnea nor retractions. Breath sounds are clear and equal bilaterally. No wheezes/rales/rhonchi. Gastrointestinal: Soft. No distention, no guarding, no rebound. Nontender. No masses  on exam.  Genitourinary/rectal:Deferred Musculoskeletal: Nontender with normal range of motion in all extremities. No joint effusions.  No lower extremity tenderness.  No edema. Neurologic:  Normal speech and language. No gross or focal neurologic deficits are appreciated. Skin:  Skin is warm, dry and intact. No rash noted. Psychiatric: Mood and affect are normal. Speech and behavior are normal. Patient exhibits appropriate insight and judgment.   ____________________________________________   EKG I, Governor Rooks, MD, the attending physician have personally viewed and interpreted all ECGs.  94 bpm. Weight baseline. Narrow QRS. Undetermined access. Unable to assess ST segments due to wavy baseline.  EKG #2. 91 bpm. Normal sinus rhythm. Narrow QRS. Normal axis. Nonspecific T wave. ____________________________________________  LABS (pertinent positives/negatives)  Urinalysis trace ketones, rare bacteria and otherwise negative Basic metabolic panel significant for BUN 37 and creatinine 1.29 Troponin less than 0.03 White blood count 4.7 and platelet count 11.8, platelet count 368  ____________________________________________  RADIOLOGY All Xrays were viewed by me. Imaging interpreted by Radiologist.  None __________________________________________  PROCEDURES  Procedure(s) performed: None  Critical Care performed: None  ____________________________________________   ED COURSE / ASSESSMENT AND PLAN  Pertinent labs & imaging results that were available during my care of the patient were reviewed by me and considered in my medical decision  making (see chart for details).   Patient's daughter feels patient referred to ED for eval for failure to thrive and possible need for feeding tube.  It sounds like this problem has been ongoing now for about 4-6 weeks, and he still not having good by mouth intake.  He is fairly cachectic. On history he has had weight loss, but no focal symptoms to suggest a source of cancer. No abdominal pain. No change in stools. No trouble breathing or shortness of breath. No change in mental status.  On his laboratory evaluation his BUN and creatinine are slightly elevated at 37 and 1.29 he was given IV fluid bolus for this.  Otherwise, I don't think he meets any criteria for need for hospitalization. I discussed this with the daughter and the patient. Patient states he wants to go home. In terms of being evaluated for possible feeding tube placement, I discussed with them that this should be coordinated through surgery/gastrology, and a primary care physician.  I did speak with Dr. Michela Pitcher, with surgery, who recommended that the patient normally*with gastroenterology evaluation. I will refer the patient to gastroenterology.  I spoke with the daughter about the weight loss making me concerned for the possibility of underlying cancer, however I don't see a focal indication for any additional testing here in the emergency room. He did have a CT scan of his abdomen about a year ago. He had chest x-ray within the last couple weeks. He has never had a colonoscopy.  I discussed with the daughter that his primary care physician can discuss any further evaluation for occult cancer as a source of his weight loss and failure to thrive.    CONSULTATIONS:   Discussed plan for evaluation/consultation for feeding tube with Dr. Michela Pitcher of surgery.   Patient / Family / Caregiver informed of clinical course, medical decision-making process, and agree with plan.   I discussed return precautions, follow-up instructions, and  discharged instructions with patient and/or family.   ___________________________________________   FINAL CLINICAL IMPRESSION(S) / ED DIAGNOSES   Final diagnoses:  FTT (failure to thrive) in adult  Acute renal failure, unspecified acute renal failure type (HCC)  Note: This dictation was prepared with Dragon dictation. Any transcriptional errors that result from this process are unintentional   Governor Rooks, MD 05/29/15 1538

## 2015-05-29 NOTE — ED Notes (Signed)
Pt to ed with c/o weakness, lethargy, decreased appetite, low blood pressure, x several weeks.  Pt family states he has lost a large amount of weight recently.  Pt family states he has not ate or drank anything since Thursday.

## 2015-05-31 ENCOUNTER — Ambulatory Visit: Payer: Medicare Other | Admitting: Physical Medicine & Rehabilitation

## 2015-06-04 ENCOUNTER — Encounter: Payer: Self-pay | Admitting: Physical Therapy

## 2015-06-04 DIAGNOSIS — R279 Unspecified lack of coordination: Secondary | ICD-10-CM

## 2015-06-04 DIAGNOSIS — M6281 Muscle weakness (generalized): Secondary | ICD-10-CM

## 2015-06-04 NOTE — Therapy (Signed)
[facility] [address] [place], [place], [address] Phone: [phone]   Fax:  [phone]  June 04, 2015   @CCLISTADDRESS @  Physical Therapy Discharge Summary  Patient: Michael ArbourClyde Goodwin  MRN: [id number]  Date of Birth: 27-Mar-1945   Diagnosis:  Muscle weakness (generalized)  Lack of coordination Referring Provider: Claudette LawsKirsteins, Andrew  The above patient had been seen in Physical Therapy 17 times of 25 treatments scheduled with 0 no shows and 5 cancellations.  The treatment consisted of gait training, strengthening, HEP instruction, balance exercise; Patient was seen in Feb-March; his last appointment was on 05/10/15. He cancelled appointments due to being weak and not feeling well. He has been to ED multiple times and was discharged home with home health PT; Patient failed to return to therapy session to assess goals.  The patient is: Unchanged      Sincerely,   Trotter,Margaret, PT, DPT  CC @[email or web address] @  [facility] [address] [place], [place], [address] Phone: [phone]   Fax:  [phone]  Patient: Michael ArbourClyde Goodwin  MRN: [id number]  Date of Birth: 27-Mar-1945

## 2015-07-12 DEATH — deceased

## 2015-07-24 ENCOUNTER — Ambulatory Visit: Payer: Medicare Other | Admitting: Neurology

## 2016-02-11 IMAGING — CT CT ABD-PELV W/O CM
1 of 2 series · 15 of 32 positions shown, 19 images · non-contrast
Comparison: 01/25/2011 CT abdomen/pelvis.

CLINICAL DATA: 69-year-old male with gross hematuria 3 weeks prior.

EXAM:
CT ABDOMEN AND PELVIS WITHOUT CONTRAST
TECHNIQUE: Multidetector CT imaging of the abdomen and pelvis was performed
following the standard protocol without IV contrast.

[Series 2: routine abd pel without · axial · non-contrast · 0.67mm/px · z∈[+180,+585]mm · 15 of 89 slices shown, 19 images]
[im 4/89  soft-tissue]
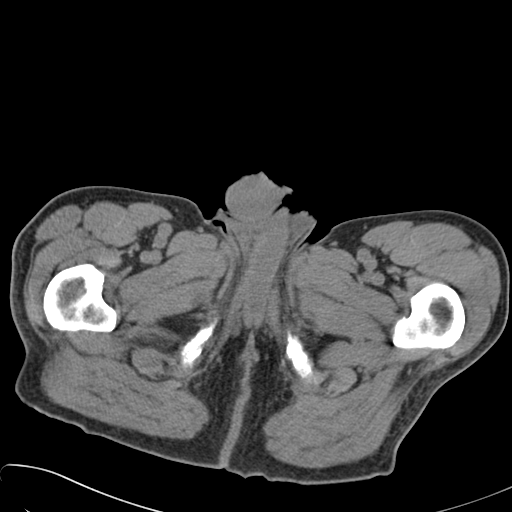
[im 4/89  bone]
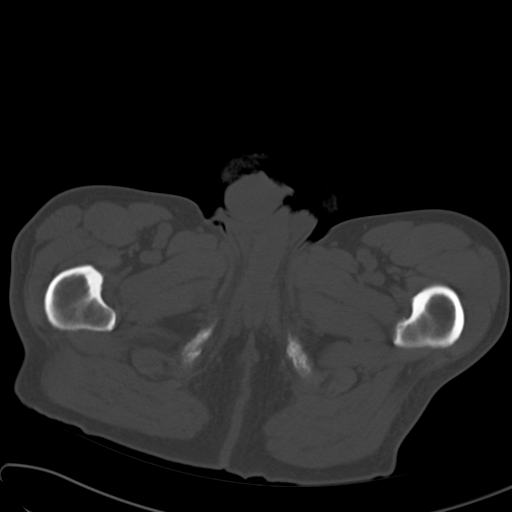
[im 11/89  soft-tissue]
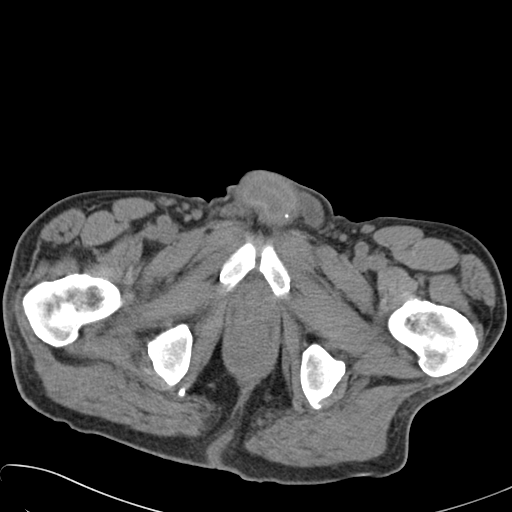
[im 18/89  soft-tissue]
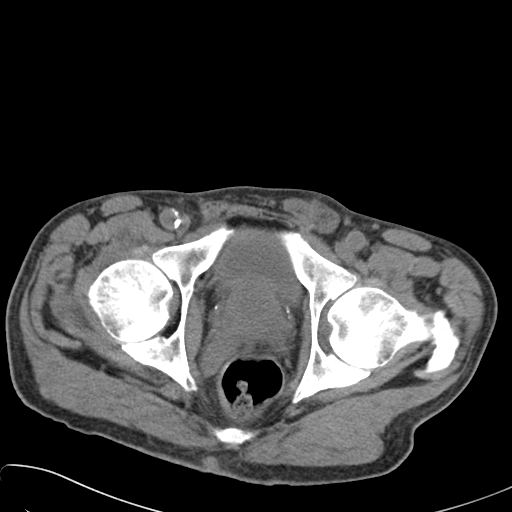
[im 25/89  soft-tissue]
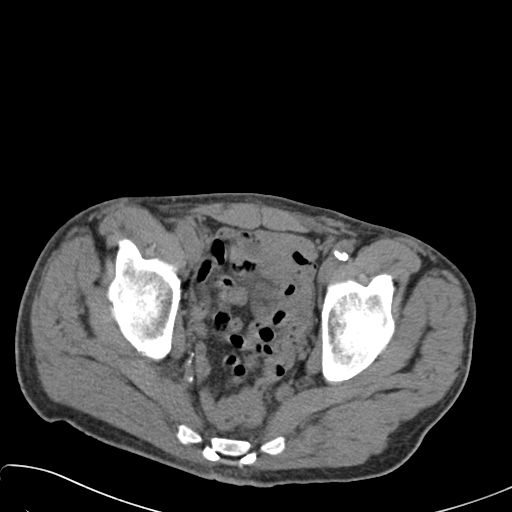
[im 32/89  soft-tissue]
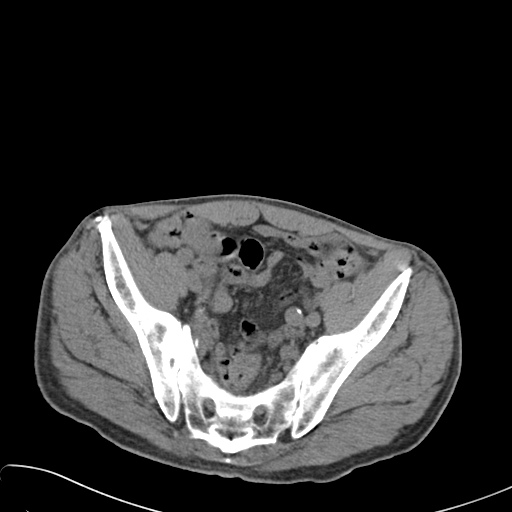
[im 39/89  soft-tissue]
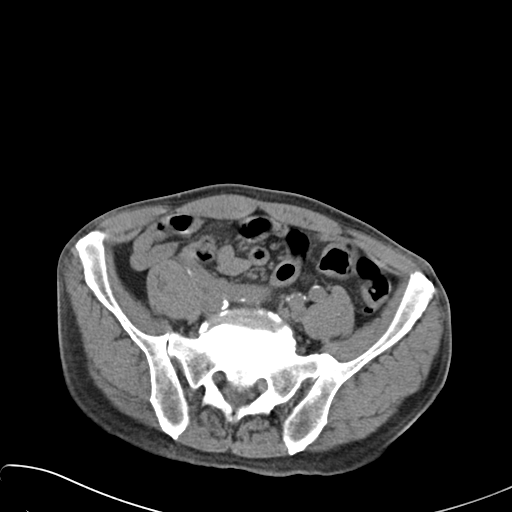
[im 46/89  soft-tissue]
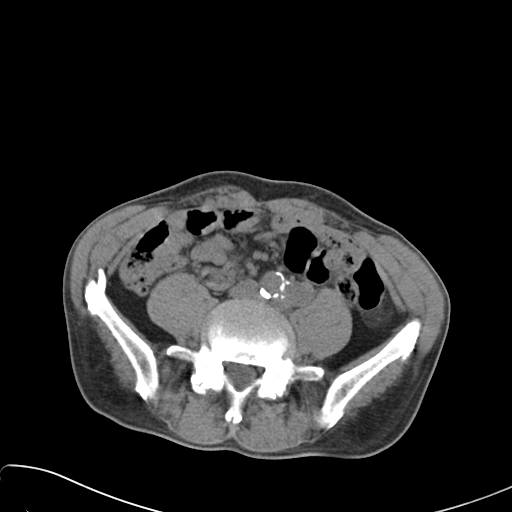
[im 50/89  soft-tissue]
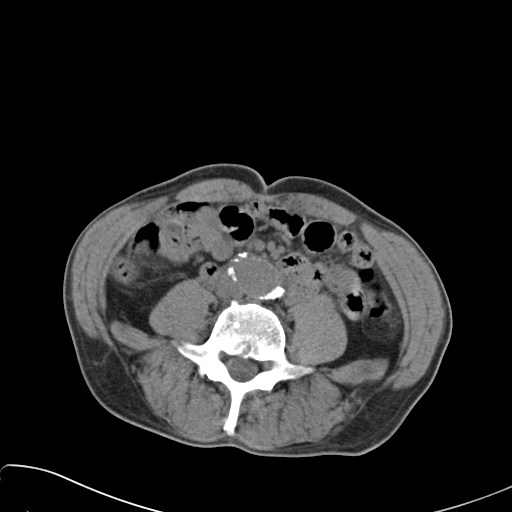
[im 57/89  soft-tissue]
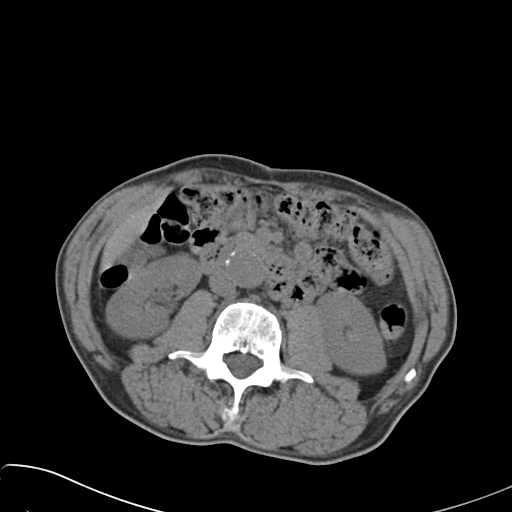
[im 57/89  bone]
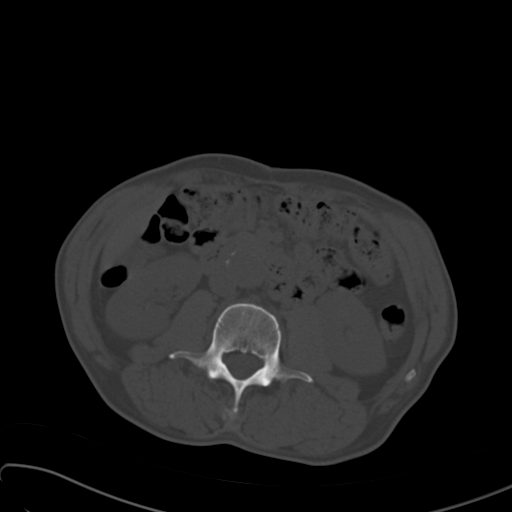
[im 64/89  soft-tissue]
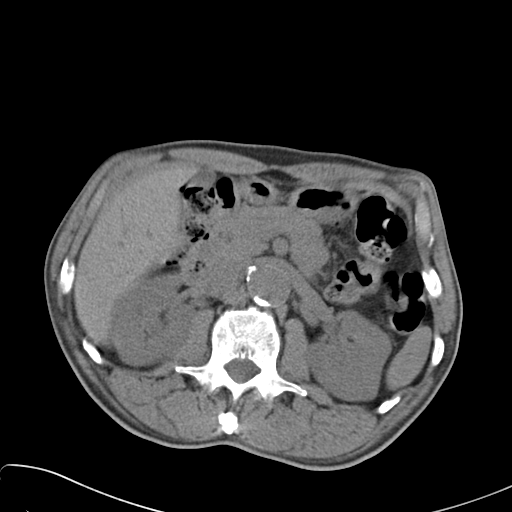
[im 71/89  soft-tissue]
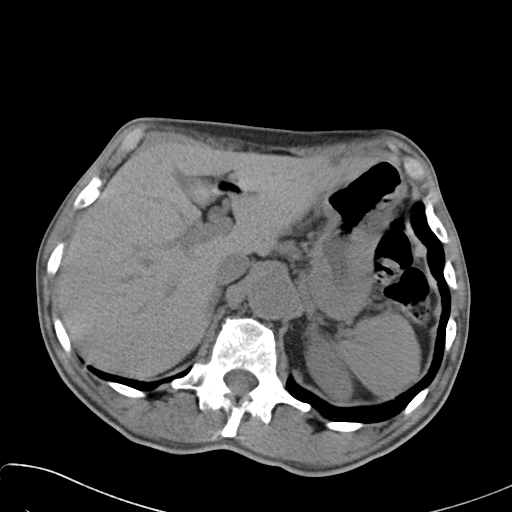
[im 74/89  lung]
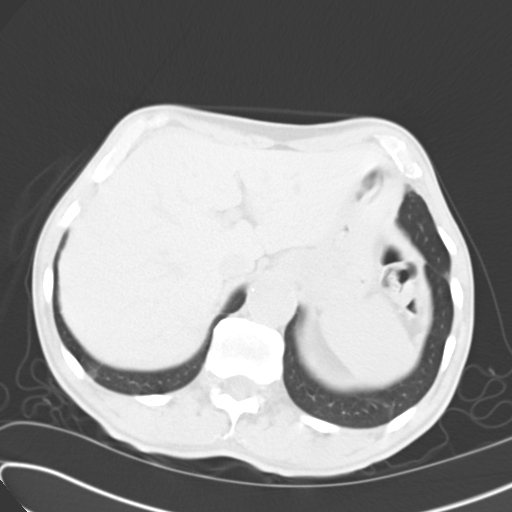
[im 78/89  soft-tissue]
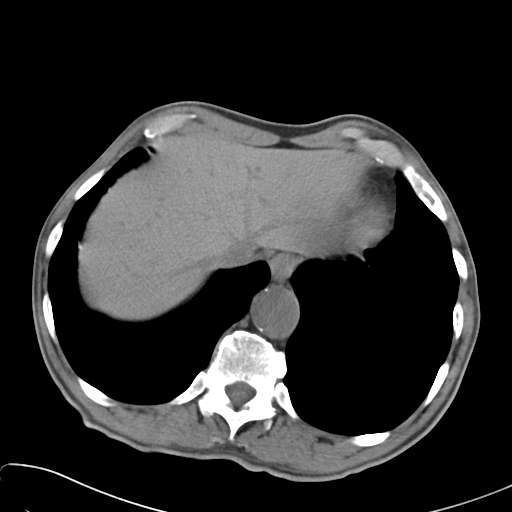
[im 78/89  lung]
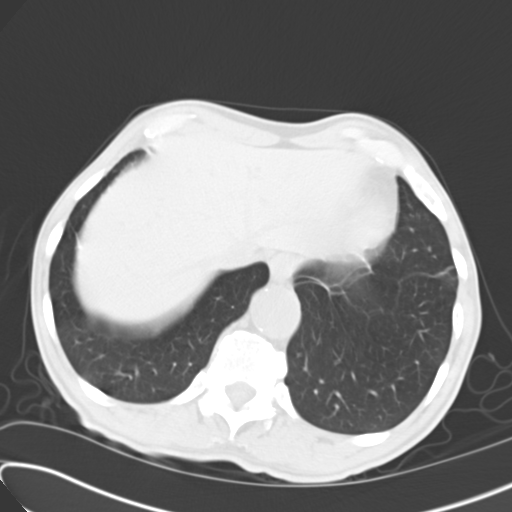
[im 81/89  lung]
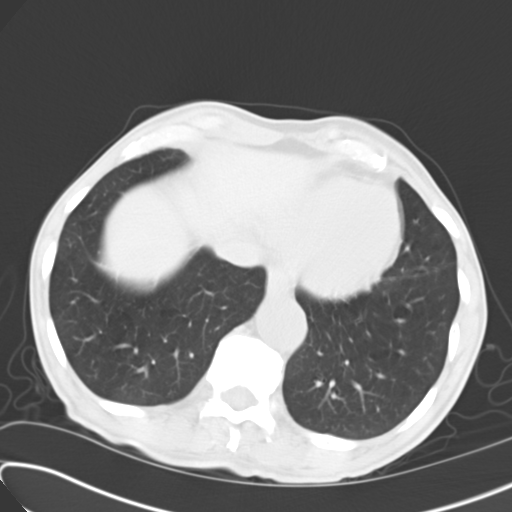
[im 85/89  soft-tissue]
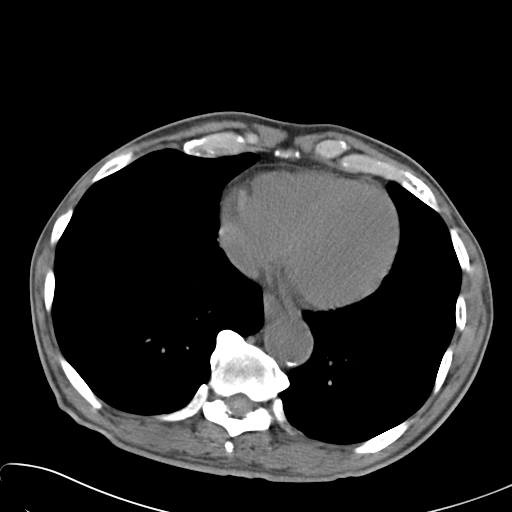
[im 85/89  lung]
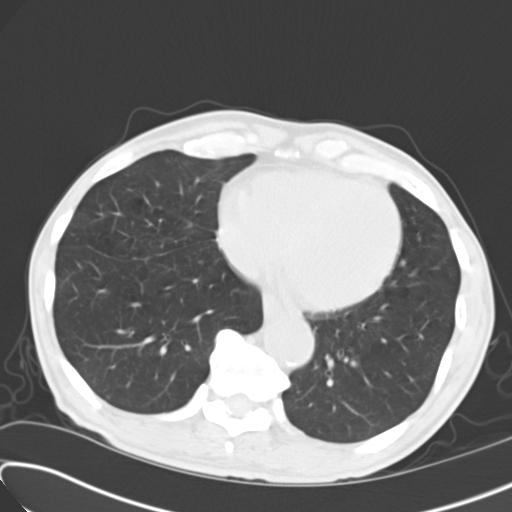

[15 of 32 positions shown; findings below may reference images not displayed]

FINDINGS: Lower chest: Basilar left lower lobe 6 mm pulmonary nodule (series
4/image 13) is stable since 4084. Mild centrilobular emphysema is
seen in the visualized lung bases.

Hepatobiliary: Simple 1.1 cm lateral segment left liver lobe cyst.
Otherwise normal liver. Normal gallbladder, with no radiopaque
cholelithiasis. No biliary ductal dilatation.

Pancreas: Normal.

Spleen: Normal.

Adrenals/Urinary Tract: Normal adrenals. Hypodense 0.7 cm renal
lesion in the mid right kidney, too small to characterize, stable
since 4084, in keeping with a benign cyst. Otherwise normal kidneys
with no nephrolithiasis, no hydronephrosis and no contour-deforming
renal mass. Normal caliber ureters. Minimally distended and grossly
normal urinary bladder.

Stomach/Bowel: Relatively collapsed and grossly normal stomach.

Vascular/Lymphatic: Normal caliber small bowel. Normal appendix.
Mild sigmoid diverticulosis, with no noncontrast CT evidence of
acute diverticulitis.

Reproductive: Stable mild prostatomegaly with nonspecific internal
prostatic calcifications.

Other: No pneumoperitoneum, ascites or focal fluid collection.

Musculoskeletal: Moderate degenerative changes are seen in the
thoracic spine, most prominent at L5-S1 with curvilinear
calcification posterior to the L5-S1 disc space indicating a chronic
annular tear. A few scattered subcentimeter sclerotic lesions
throughout the visualized axial skeleton are not appreciably changed
since 4084, in keeping with benign bone islands.
IMPRESSION: 1. No noncontrast CT findings to explain the patient's hematuria. No
nephrolithiasis. No evidence of urinary tract obstruction.
2. Stable mild prostatomegaly.
3. Mild sigmoid diverticulosis.

## 2016-10-12 IMAGING — CR DG CHEST 2V
1 series · 2 of 2 positions shown · non-contrast
Comparison: 07/07/2014

CLINICAL DATA: Weakness and left chest pressure for 2-3 weeks.

EXAM:
CHEST  2 VIEW

[Series 1: dg chest 2 view · 0.14mm/px · 2 of 2 slices shown]
[im 1/2]
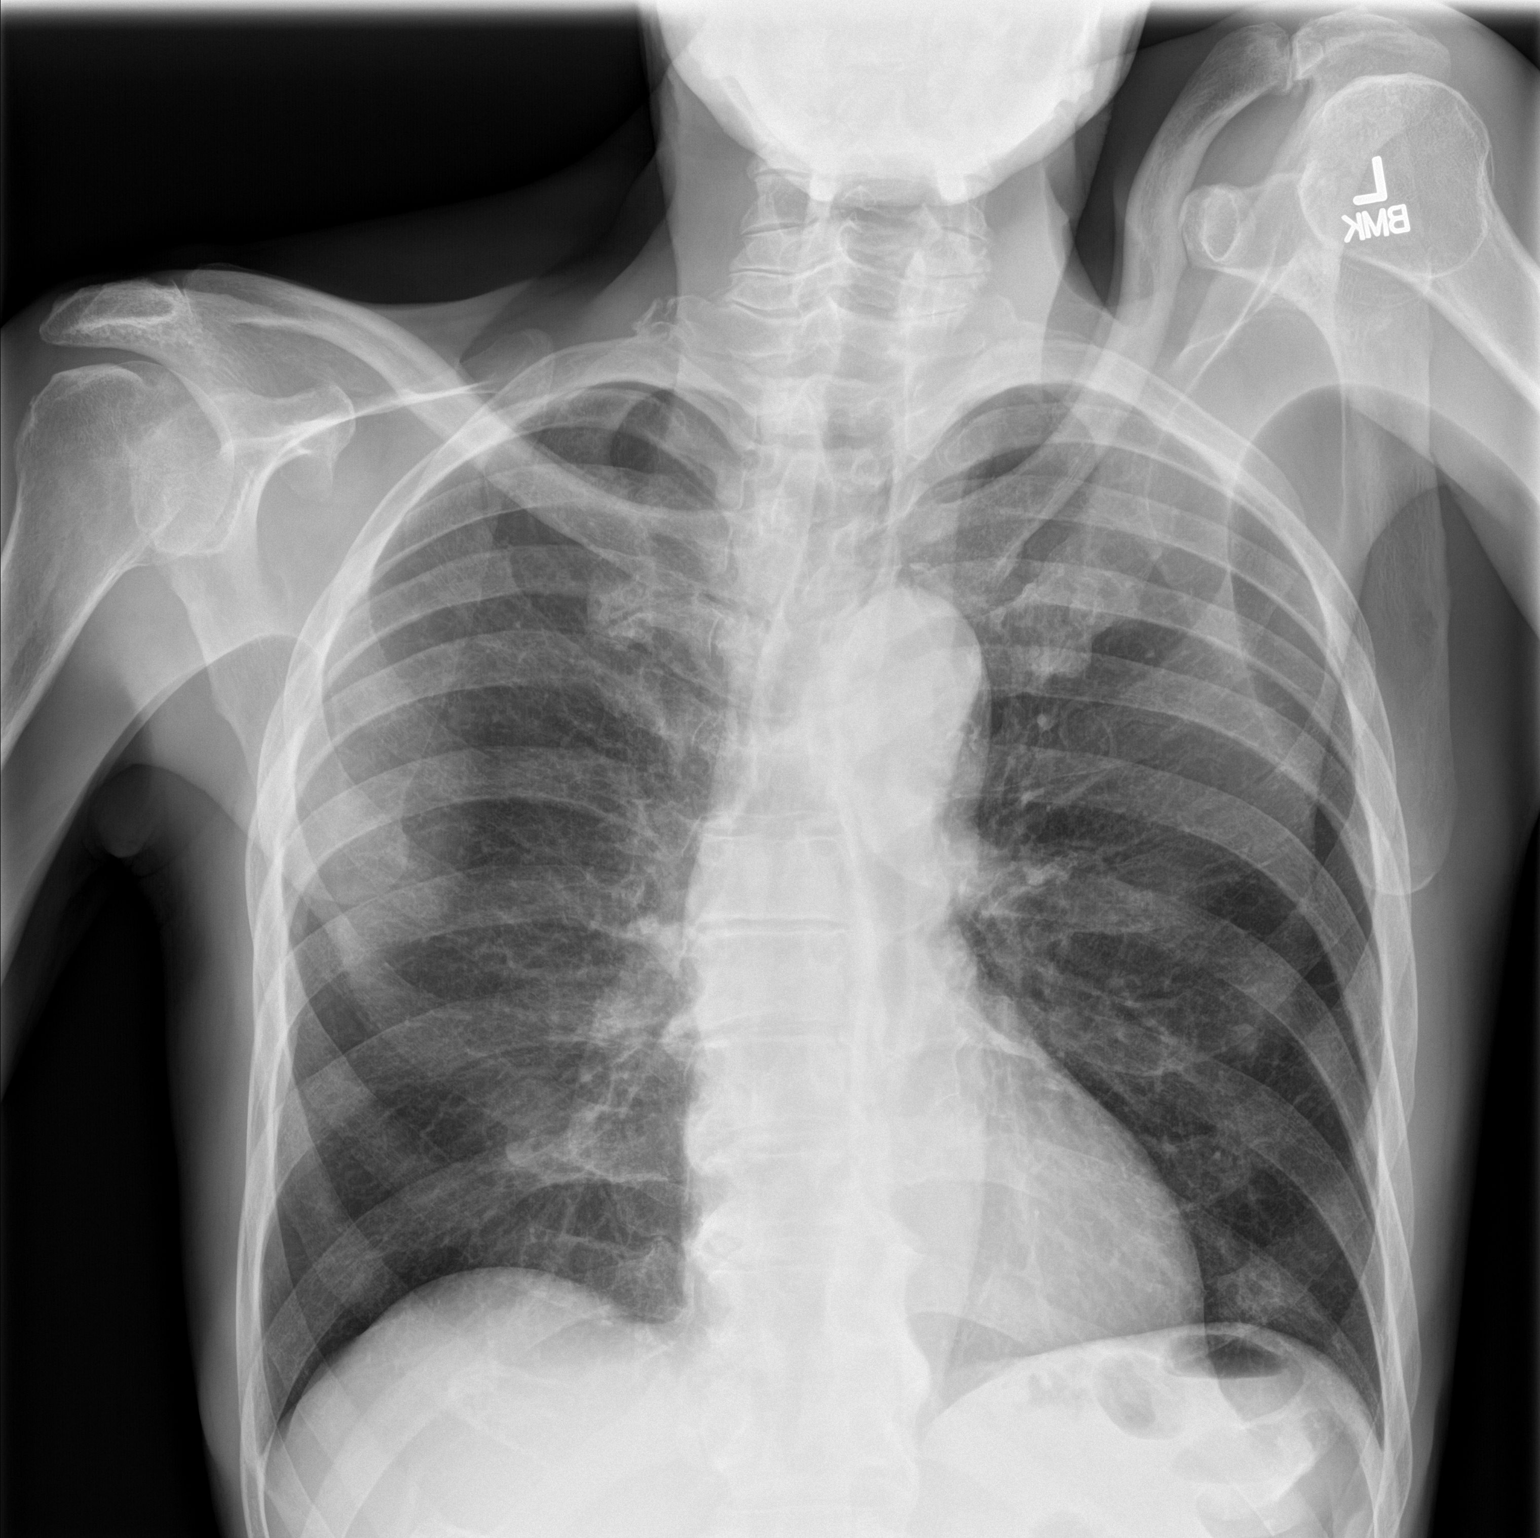
[im 2/2]
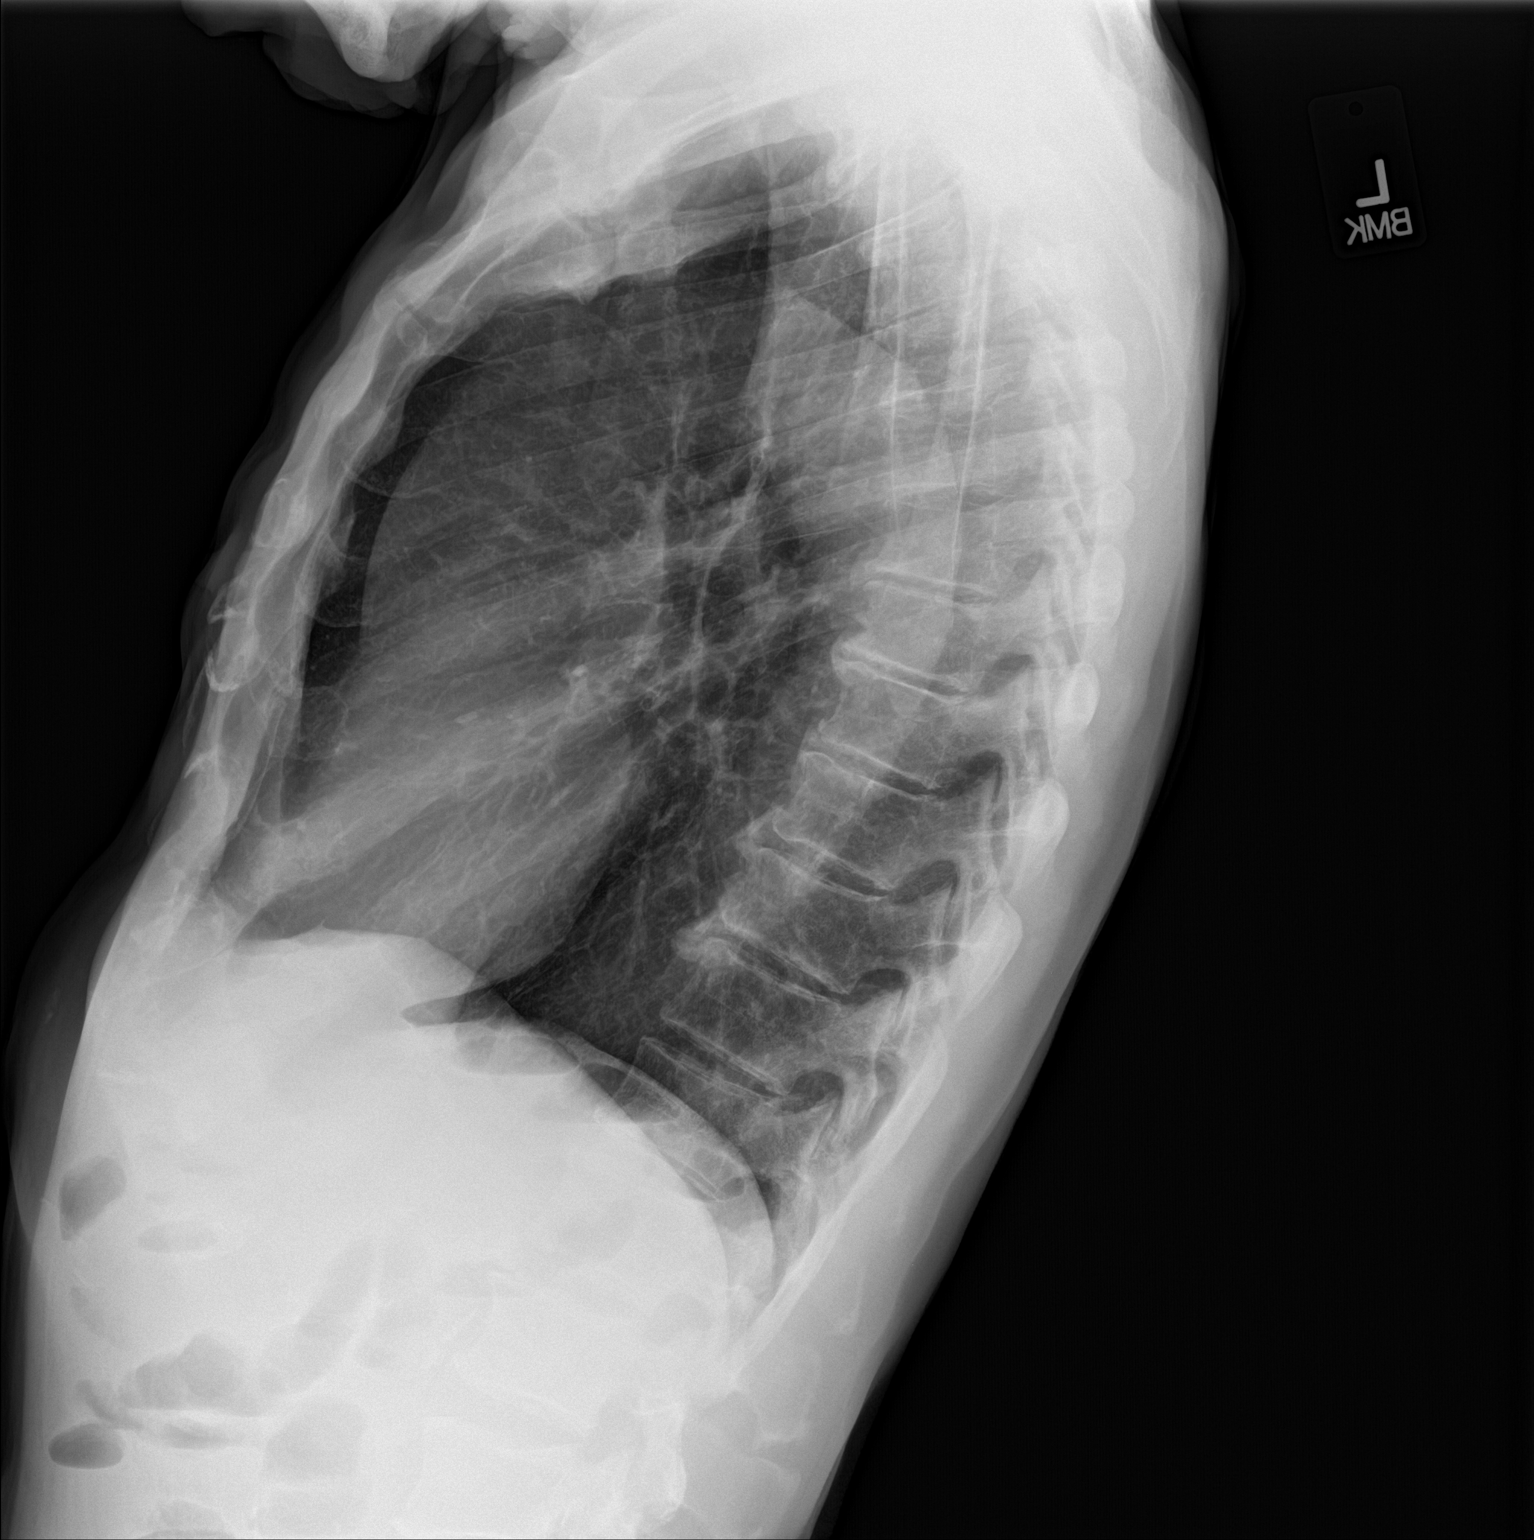

[2 of 2 positions shown; findings below may reference images not displayed]

FINDINGS: Nipple shadows project over both bases. The lungs are clear.
Pulmonary vasculature is normal. There is no pleural effusion. Heart
size is normal. Hilar and mediastinal contours are unremarkable and
unchanged.
IMPRESSION: No active cardiopulmonary disease.
